# Patient Record
Sex: Male | Born: 1975 | Race: Black or African American | Hispanic: No | Marital: Married
Health system: Southern US, Community
[De-identification: ages and names within clinical notes are randomized; demographics above are authoritative.]

## PROBLEM LIST (undated history)

## (undated) DIAGNOSIS — I82409 Acute embolism and thrombosis of unspecified deep veins of unspecified lower extremity: Secondary | ICD-10-CM

## (undated) DIAGNOSIS — I2699 Other pulmonary embolism without acute cor pulmonale: Secondary | ICD-10-CM

## (undated) DIAGNOSIS — R55 Syncope and collapse: Secondary | ICD-10-CM

## (undated) DIAGNOSIS — Z8249 Family history of ischemic heart disease and other diseases of the circulatory system: Secondary | ICD-10-CM

## (undated) DIAGNOSIS — F1410 Cocaine abuse, uncomplicated: Secondary | ICD-10-CM

## (undated) DIAGNOSIS — Z720 Tobacco use: Secondary | ICD-10-CM

## (undated) HISTORY — PX: NO PAST SURGERIES: SHX2092

---

## 1999-06-14 ENCOUNTER — Emergency Department (HOSPITAL_COMMUNITY): Admission: EM | Admit: 1999-06-14 | Discharge: 1999-06-14 | Payer: Self-pay | Admitting: Emergency Medicine

## 1999-07-22 ENCOUNTER — Emergency Department (HOSPITAL_COMMUNITY): Admission: EM | Admit: 1999-07-22 | Discharge: 1999-07-22 | Payer: Self-pay | Admitting: Emergency Medicine

## 2000-07-24 ENCOUNTER — Emergency Department (HOSPITAL_COMMUNITY): Admission: EM | Admit: 2000-07-24 | Discharge: 2000-07-24 | Payer: Self-pay | Admitting: Emergency Medicine

## 2000-10-05 ENCOUNTER — Emergency Department (HOSPITAL_COMMUNITY): Admission: EM | Admit: 2000-10-05 | Discharge: 2000-10-05 | Payer: Self-pay

## 2002-05-23 ENCOUNTER — Emergency Department (HOSPITAL_COMMUNITY): Admission: EM | Admit: 2002-05-23 | Discharge: 2002-05-23 | Payer: Self-pay | Admitting: Emergency Medicine

## 2003-09-25 ENCOUNTER — Emergency Department (HOSPITAL_COMMUNITY): Admission: EM | Admit: 2003-09-25 | Discharge: 2003-09-25 | Payer: Self-pay | Admitting: Emergency Medicine

## 2004-04-21 ENCOUNTER — Emergency Department (HOSPITAL_COMMUNITY): Admission: EM | Admit: 2004-04-21 | Discharge: 2004-04-21 | Payer: Self-pay | Admitting: Emergency Medicine

## 2007-02-02 ENCOUNTER — Emergency Department (HOSPITAL_COMMUNITY): Admission: EM | Admit: 2007-02-02 | Discharge: 2007-02-02 | Payer: Self-pay | Admitting: Emergency Medicine

## 2007-06-13 ENCOUNTER — Emergency Department (HOSPITAL_COMMUNITY): Admission: EM | Admit: 2007-06-13 | Discharge: 2007-06-13 | Payer: Self-pay | Admitting: Emergency Medicine

## 2008-08-18 ENCOUNTER — Ambulatory Visit: Payer: Self-pay | Admitting: Cardiology

## 2008-08-18 ENCOUNTER — Ambulatory Visit: Payer: Self-pay | Admitting: Family Medicine

## 2008-08-18 ENCOUNTER — Inpatient Hospital Stay (HOSPITAL_COMMUNITY): Admission: EM | Admit: 2008-08-18 | Discharge: 2008-08-21 | Payer: Self-pay | Admitting: Emergency Medicine

## 2008-08-20 ENCOUNTER — Encounter (INDEPENDENT_AMBULATORY_CARE_PROVIDER_SITE_OTHER): Payer: Self-pay | Admitting: Family Medicine

## 2008-08-20 LAB — CONVERTED CEMR LAB: Cholesterol: 142 mg/dL

## 2008-08-24 ENCOUNTER — Telehealth: Payer: Self-pay | Admitting: Family Medicine

## 2008-08-25 ENCOUNTER — Telehealth: Payer: Self-pay | Admitting: *Deleted

## 2008-08-26 ENCOUNTER — Telehealth: Payer: Self-pay | Admitting: *Deleted

## 2008-08-26 ENCOUNTER — Encounter: Payer: Self-pay | Admitting: Family Medicine

## 2008-08-26 ENCOUNTER — Ambulatory Visit: Payer: Self-pay | Admitting: Family Medicine

## 2008-08-26 DIAGNOSIS — I2699 Other pulmonary embolism without acute cor pulmonale: Secondary | ICD-10-CM

## 2008-08-29 ENCOUNTER — Encounter: Payer: Self-pay | Admitting: Family Medicine

## 2008-08-29 ENCOUNTER — Ambulatory Visit: Payer: Self-pay | Admitting: Family Medicine

## 2008-08-29 LAB — CONVERTED CEMR LAB: INR: 1.6

## 2008-09-01 ENCOUNTER — Ambulatory Visit: Payer: Self-pay | Admitting: Family Medicine

## 2008-09-11 ENCOUNTER — Telehealth: Payer: Self-pay | Admitting: Family Medicine

## 2008-10-01 ENCOUNTER — Ambulatory Visit: Payer: Self-pay | Admitting: Family Medicine

## 2008-10-01 ENCOUNTER — Encounter: Payer: Self-pay | Admitting: Family Medicine

## 2008-10-01 ENCOUNTER — Inpatient Hospital Stay (HOSPITAL_COMMUNITY): Admission: AD | Admit: 2008-10-01 | Discharge: 2008-10-05 | Payer: Self-pay | Admitting: Family Medicine

## 2008-10-01 ENCOUNTER — Encounter: Payer: Self-pay | Admitting: Emergency Medicine

## 2008-10-01 DIAGNOSIS — F1410 Cocaine abuse, uncomplicated: Secondary | ICD-10-CM | POA: Insufficient documentation

## 2008-10-01 DIAGNOSIS — R079 Chest pain, unspecified: Secondary | ICD-10-CM

## 2008-10-07 ENCOUNTER — Ambulatory Visit: Payer: Self-pay | Admitting: Family Medicine

## 2008-10-07 LAB — CONVERTED CEMR LAB: INR: 1.9

## 2008-10-09 ENCOUNTER — Telehealth: Payer: Self-pay | Admitting: *Deleted

## 2008-10-14 ENCOUNTER — Telehealth: Payer: Self-pay | Admitting: *Deleted

## 2008-10-22 ENCOUNTER — Inpatient Hospital Stay (HOSPITAL_COMMUNITY): Admission: EM | Admit: 2008-10-22 | Discharge: 2008-10-23 | Payer: Self-pay | Admitting: Emergency Medicine

## 2008-10-22 ENCOUNTER — Ambulatory Visit: Payer: Self-pay | Admitting: Family Medicine

## 2008-10-22 ENCOUNTER — Encounter: Payer: Self-pay | Admitting: Family Medicine

## 2008-10-24 ENCOUNTER — Telehealth: Payer: Self-pay | Admitting: Family Medicine

## 2008-12-27 ENCOUNTER — Encounter (INDEPENDENT_AMBULATORY_CARE_PROVIDER_SITE_OTHER): Payer: Self-pay | Admitting: *Deleted

## 2008-12-27 DIAGNOSIS — F17200 Nicotine dependence, unspecified, uncomplicated: Secondary | ICD-10-CM

## 2009-03-19 ENCOUNTER — Emergency Department (HOSPITAL_COMMUNITY): Admission: EM | Admit: 2009-03-19 | Discharge: 2009-03-19 | Payer: Self-pay | Admitting: Emergency Medicine

## 2009-05-18 ENCOUNTER — Emergency Department (HOSPITAL_COMMUNITY): Admission: EM | Admit: 2009-05-18 | Discharge: 2009-05-18 | Payer: Self-pay | Admitting: Internal Medicine

## 2009-05-19 ENCOUNTER — Emergency Department (HOSPITAL_COMMUNITY): Admission: EM | Admit: 2009-05-19 | Discharge: 2009-05-19 | Payer: Self-pay | Admitting: Emergency Medicine

## 2009-05-24 ENCOUNTER — Emergency Department (HOSPITAL_COMMUNITY): Admission: EM | Admit: 2009-05-24 | Discharge: 2009-05-25 | Payer: Self-pay | Admitting: Emergency Medicine

## 2009-06-01 ENCOUNTER — Emergency Department (HOSPITAL_COMMUNITY): Admission: EM | Admit: 2009-06-01 | Discharge: 2009-06-01 | Payer: Self-pay | Admitting: Emergency Medicine

## 2009-08-10 ENCOUNTER — Emergency Department (HOSPITAL_COMMUNITY): Admission: EM | Admit: 2009-08-10 | Discharge: 2009-08-10 | Payer: Self-pay | Admitting: Emergency Medicine

## 2009-08-29 IMAGING — CT CT ANGIO CHEST
1 of 3 series · 18 of 33 positions shown · IV contrast (APPLIED)
Comparison: 08/18/2008

CLINICAL DATA: Chest pain and shortness of breath.  Question
pulmonary embolus.  Cocaine abuse.

CT ANGIOGRAPHY CHEST WITH CONTRAST
TECHNIQUE: Multidetector CT imaging of the chest was performed
using the standard protocol during bolus administration of
intravenous contrast. Multiplanar CT image reconstructions
including MIPs were obtained to evaluate the vascular anatomy.
Contrast: 100 ml [id number]

[Series 5: pe thins @ 1mm · axial · 0.70mm/px · z∈[-332,-57]mm · 18 of 315 slices shown]
[im 20/315  lung]
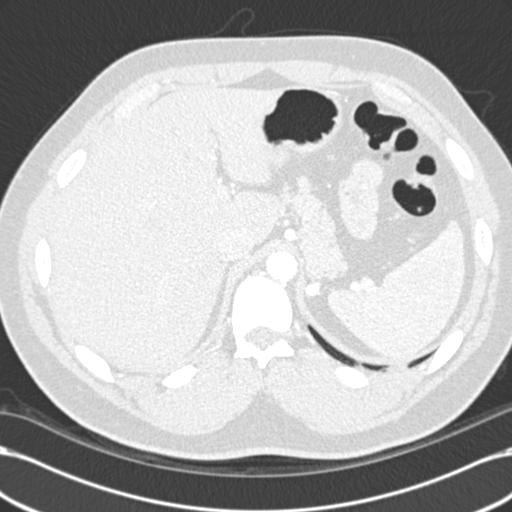
[im 40/315  mediastinal]
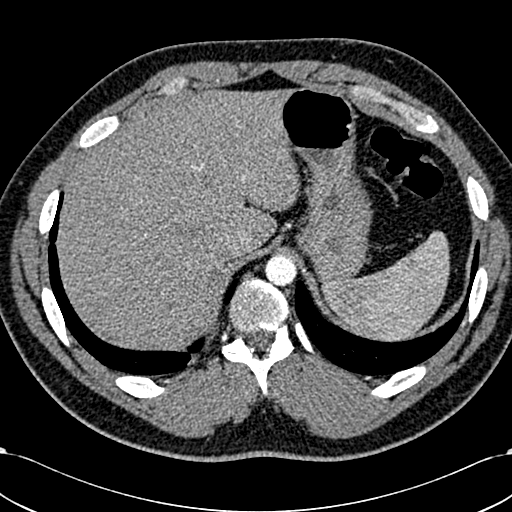
[im 59/315  lung]
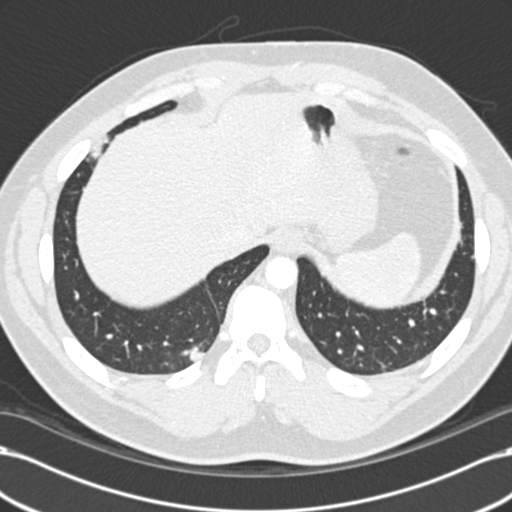
[im 79/315  mediastinal]
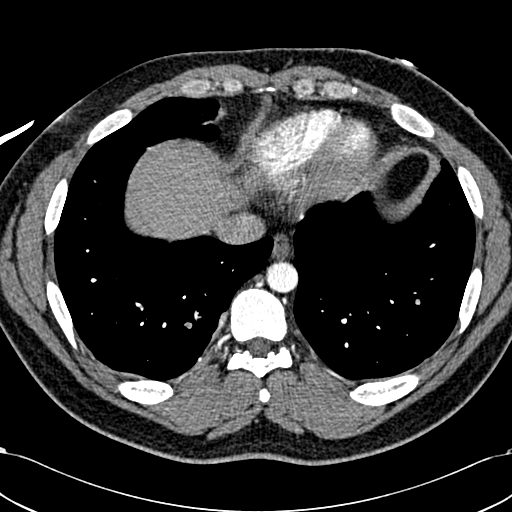
[im 99/315  lung]
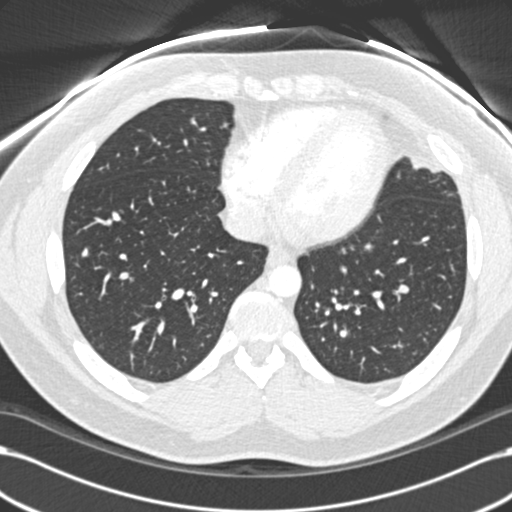
[im 105/315  mediastinal]
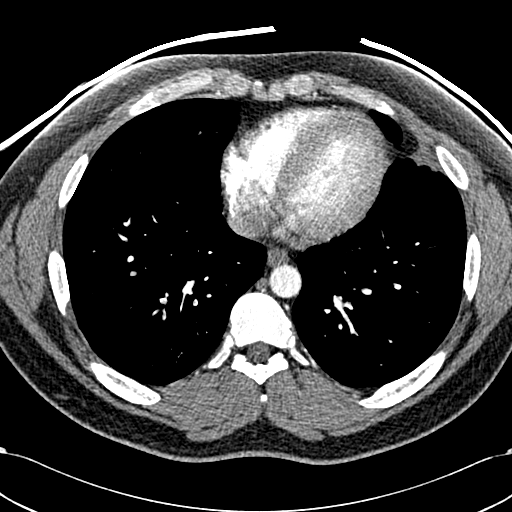
[im 118/315  lung]
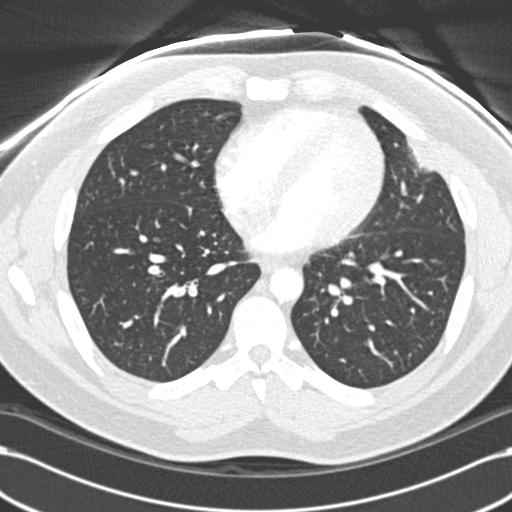
[im 138/315  mediastinal]
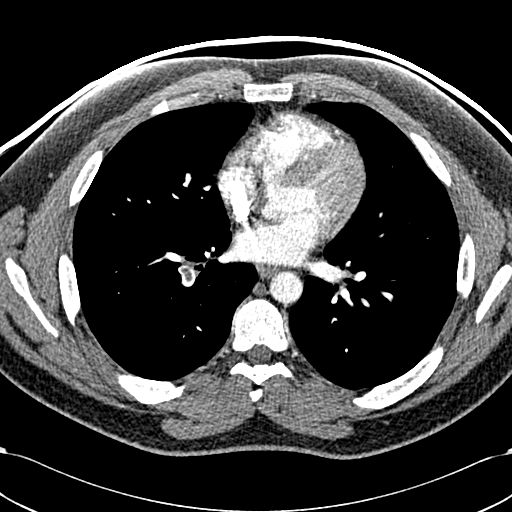
[im 148/315  lung]
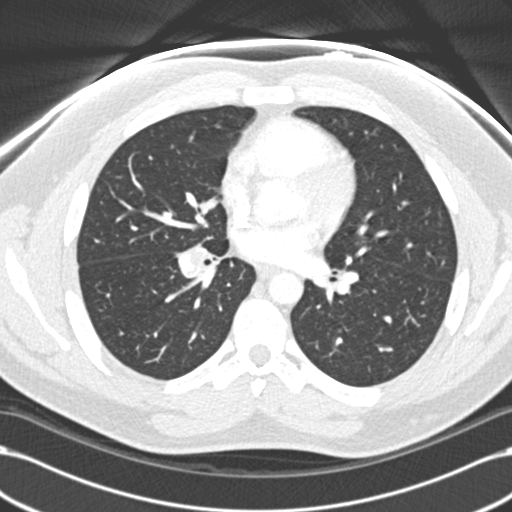
[im 158/315  mediastinal]
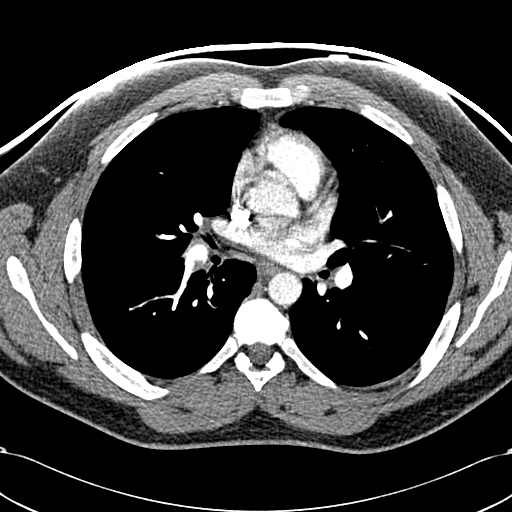
[im 177/315  lung]
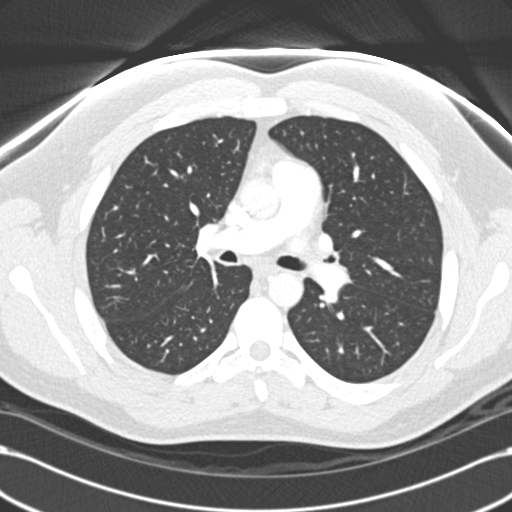
[im 197/315  mediastinal]
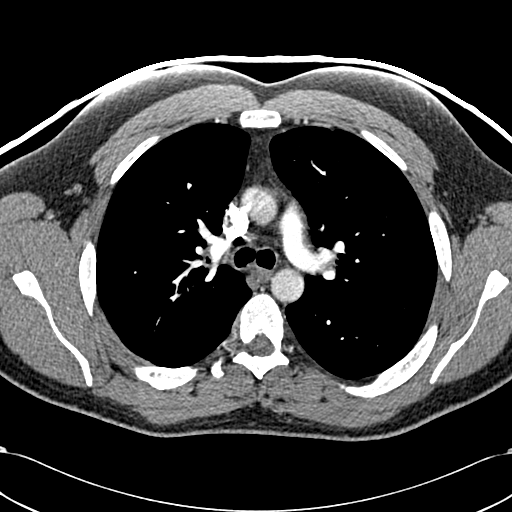
[im 210/315  lung]
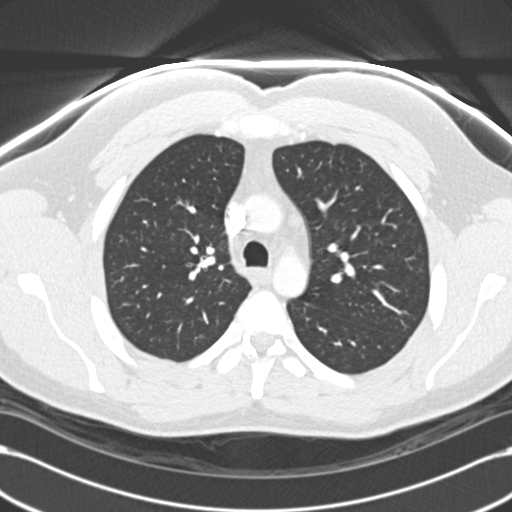
[im 216/315  mediastinal]
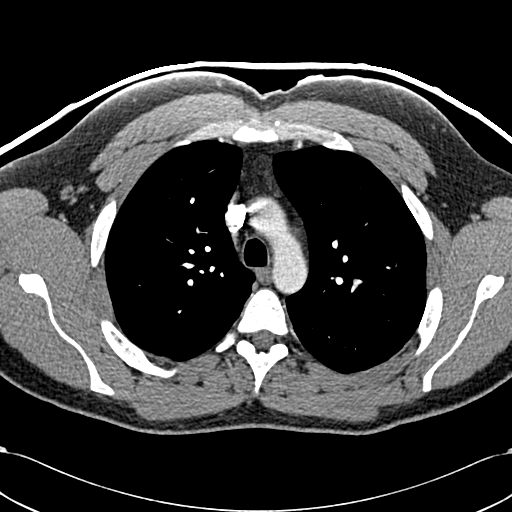
[im 236/315  lung]
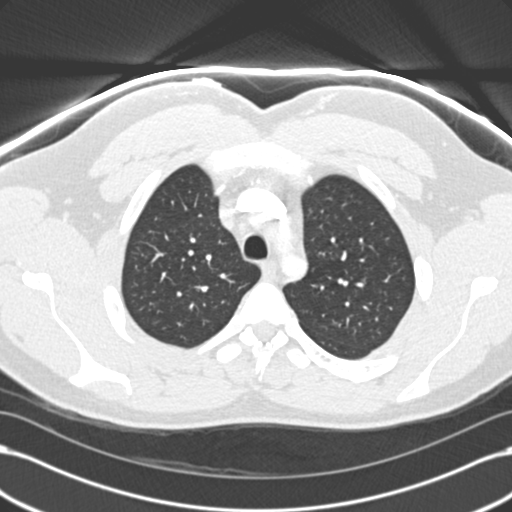
[im 256/315  mediastinal]
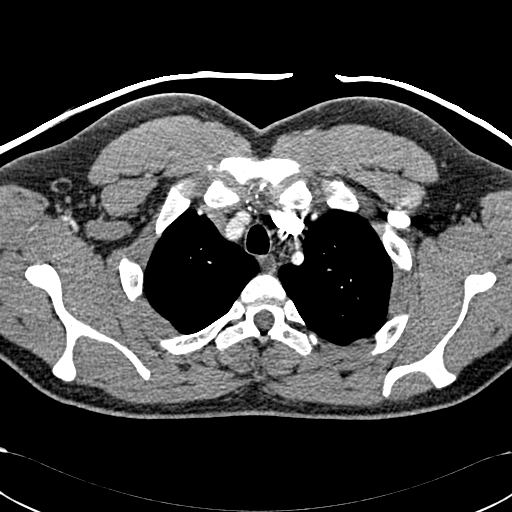
[im 275/315  lung]
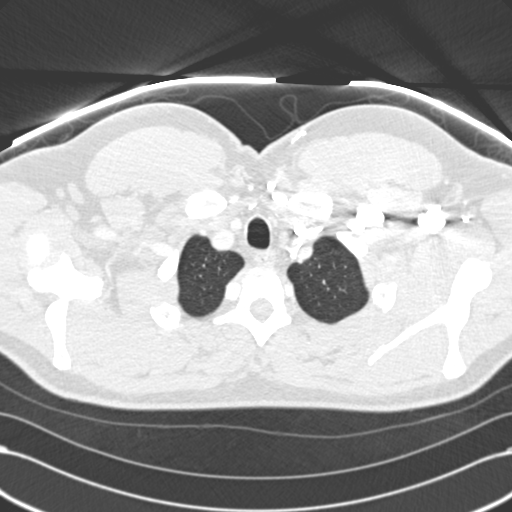
[im 295/315  mediastinal]
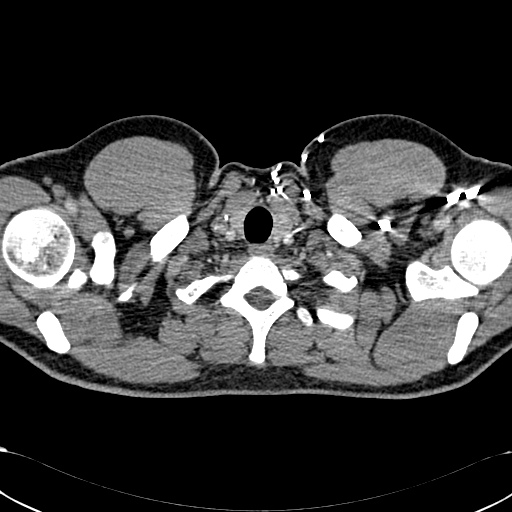

[18 of 33 positions shown; findings below may reference images not displayed]

FINDINGS: New filling defects are seen in right lower lobe
pulmonary arteries, with the most proximal clot seen at the lobar
level.  A subsegmental filling defect is seen in the left lower
lobe.  Mediastinal lymph nodes are not enlarged by CT size
criteria.  No hilar or axillary adenopathy.  There may be slight
straightening of the interventricular septum.  Heart size normal.
No pericardial effusion.

There is probable subpleural scarring in the lingula and both lower
lobes.  Lungs are otherwise clear.  No pleural fluid.  Airway is
unremarkable.

Incidental imaging of the upper abdomen shows no acute findings.
No worrisome lytic or sclerotic lesions.

Review of the MIP images confirms the above findings.
IMPRESSION: Bilateral pulmonary emboli.  Cannot exclude early or mild right
heart strain.

## 2009-10-27 ENCOUNTER — Emergency Department (HOSPITAL_COMMUNITY): Admission: EM | Admit: 2009-10-27 | Discharge: 2009-10-27 | Payer: Self-pay | Admitting: Emergency Medicine

## 2009-11-23 ENCOUNTER — Emergency Department (HOSPITAL_COMMUNITY): Admission: EM | Admit: 2009-11-23 | Discharge: 2009-11-23 | Payer: Self-pay | Admitting: Emergency Medicine

## 2009-12-28 ENCOUNTER — Emergency Department (HOSPITAL_COMMUNITY): Admission: EM | Admit: 2009-12-28 | Discharge: 2009-12-28 | Payer: Self-pay | Admitting: Emergency Medicine

## 2010-06-02 LAB — POCT CARDIAC MARKERS
CKMB, poc: 1.1 ng/mL (ref 1.0–8.0)
Myoglobin, poc: 65.1 ng/mL (ref 12–200)
Troponin i, poc: <0.05 ng/mL (ref 0.00–0.09)
Troponin i, poc: <0.05 ng/mL (ref 0.00–0.09)

## 2010-06-02 LAB — POCT I-STAT, CHEM 8
BUN: 12 mg/dL (ref 6–23)
Calcium, Ion: 1.1 mmol/L — ABNORMAL LOW (ref 1.12–1.32)
Chloride: 106 meq/L (ref 96–112)
Creatinine, Ser: 1.4 mg/dL (ref 0.4–1.5)
Glucose, Bld: 105 mg/dL — ABNORMAL HIGH (ref 70–99)
HCT: 47 % (ref 39.0–52.0)
Hemoglobin: 16 g/dL (ref 13.0–17.0)
Potassium: 3.6 meq/L (ref 3.5–5.1)
Sodium: 140 meq/L (ref 135–145)
TCO2: 22 mmol/L (ref 0–100)

## 2010-06-02 LAB — DIFFERENTIAL
Basophils Absolute: 0 K/uL (ref 0.0–0.1)
Basophils Relative: 0 % (ref 0–1)
Eosinophils Absolute: 0 K/uL (ref 0.0–0.7)
Eosinophils Relative: 0 % (ref 0–5)
Lymphocytes Relative: 7 % — ABNORMAL LOW (ref 12–46)
Lymphs Abs: 1 K/uL (ref 0.7–4.0)
Monocytes Absolute: 1 K/uL (ref 0.1–1.0)
Monocytes Relative: 7 % (ref 3–12)
Neutro Abs: 12.4 K/uL — ABNORMAL HIGH (ref 1.7–7.7)
Neutrophils Relative %: 86 % — ABNORMAL HIGH (ref 43–77)

## 2010-06-02 LAB — CBC
Platelets: 139 10*3/uL — ABNORMAL LOW (ref 150–400)
RDW: 13.9 % (ref 11.5–15.5)
WBC: 14.4 10*3/uL — ABNORMAL HIGH (ref 4.0–10.5)

## 2010-06-02 LAB — D-DIMER, QUANTITATIVE

## 2010-06-03 LAB — D-DIMER, QUANTITATIVE: D-Dimer, Quant: 0.27 ug/mL-FEU (ref 0.00–0.48)

## 2010-06-03 LAB — POCT CARDIAC MARKERS
CKMB, poc: <1 ng/mL — ABNORMAL LOW (ref 1.0–8.0)
CKMB, poc: <1 ng/mL — ABNORMAL LOW (ref 1.0–8.0)
Myoglobin, poc: 72 ng/mL (ref 12–200)
Troponin i, poc: <0.05 ng/mL (ref 0.00–0.09)

## 2010-06-03 LAB — CBC
HCT: 45.1 % (ref 39.0–52.0)
MCH: 30 pg (ref 26.0–34.0)
MCHC: 34.2 g/dL (ref 30.0–36.0)
MCV: 87.8 fL (ref 78.0–100.0)
Platelets: 153 10*3/uL (ref 150–400)
RDW: 14.2 % (ref 11.5–15.5)
WBC: 13 10*3/uL — ABNORMAL HIGH (ref 4.0–10.5)

## 2010-06-03 LAB — BASIC METABOLIC PANEL
BUN: 11 mg/dL (ref 6–23)
Chloride: 104 mEq/L (ref 96–112)
Creatinine, Ser: 1.33 mg/dL (ref 0.4–1.5)
Glucose, Bld: 110 mg/dL — ABNORMAL HIGH (ref 70–99)
Potassium: 3.6 mEq/L (ref 3.5–5.1)

## 2010-06-03 LAB — DIFFERENTIAL
Basophils Absolute: 0 10*3/uL (ref 0.0–0.1)
Basophils Relative: 0 % (ref 0–1)
Eosinophils Absolute: 0.1 10*3/uL (ref 0.0–0.7)
Eosinophils Relative: 1 % (ref 0–5)
Lymphocytes Relative: 12 % (ref 12–46)
Monocytes Absolute: 0.9 10*3/uL (ref 0.1–1.0)

## 2010-06-04 LAB — COMPREHENSIVE METABOLIC PANEL
AST: 38 U/L — ABNORMAL HIGH (ref 0–37)
Albumin: 4 g/dL (ref 3.5–5.2)
Alkaline Phosphatase: 57 U/L (ref 39–117)
BUN: 9 mg/dL (ref 6–23)
Creatinine, Ser: 1.46 mg/dL (ref 0.4–1.5)
GFR calc Af Amer: >60 mL/min (ref >60)
Potassium: 3.8 mEq/L (ref 3.5–5.1)
Total Protein: 7 g/dL (ref 6.0–8.3)

## 2010-06-04 LAB — POCT CARDIAC MARKERS
CKMB, poc: <1 ng/mL — ABNORMAL LOW (ref 1.0–8.0)
Troponin i, poc: <0.05 ng/mL (ref 0.00–0.09)
Troponin i, poc: <0.05 ng/mL (ref 0.00–0.09)

## 2010-06-04 LAB — RAPID URINE DRUG SCREEN, HOSP PERFORMED
Amphetamines: NOT DETECTED
Benzodiazepines: NOT DETECTED
Cocaine: POSITIVE — AB
Opiates: NOT DETECTED
Tetrahydrocannabinol: NOT DETECTED

## 2010-06-04 LAB — DIFFERENTIAL
Eosinophils Relative: 2 % (ref 0–5)
Lymphocytes Relative: 17 % (ref 12–46)
Monocytes Absolute: 1.1 10*3/uL — ABNORMAL HIGH (ref 0.1–1.0)
Monocytes Relative: 12 % (ref 3–12)
Neutro Abs: 6.9 10*3/uL (ref 1.7–7.7)

## 2010-06-04 LAB — CBC
MCV: 87 fL (ref 78.0–100.0)
Platelets: 138 10*3/uL — ABNORMAL LOW (ref 150–400)
RBC: 5.46 MIL/uL (ref 4.22–5.81)
RDW: 14.5 % (ref 11.5–15.5)
WBC: 9.8 10*3/uL (ref 4.0–10.5)

## 2010-06-04 LAB — URINALYSIS, ROUTINE W REFLEX MICROSCOPIC
Bilirubin Urine: NEGATIVE
Hgb urine dipstick: NEGATIVE
Specific Gravity, Urine: 1.017 (ref 1.005–1.030)
pH: 5.5 (ref 5.0–8.0)

## 2010-06-07 LAB — CBC
Hemoglobin: 15 g/dL (ref 13.0–17.0)
RBC: 5.04 MIL/uL (ref 4.22–5.81)
RDW: 14.3 % (ref 11.5–15.5)
WBC: 10.3 10*3/uL (ref 4.0–10.5)

## 2010-06-07 LAB — DIFFERENTIAL
Basophils Relative: 0 % (ref 0–1)
Lymphocytes Relative: 14 % (ref 12–46)
Monocytes Absolute: 0.7 10*3/uL (ref 0.1–1.0)
Monocytes Relative: 7 % (ref 3–12)
Neutro Abs: 8.1 10*3/uL — ABNORMAL HIGH (ref 1.7–7.7)

## 2010-06-07 LAB — CK TOTAL AND CKMB (NOT AT ARMC)
CK, MB: 2.4 ng/mL (ref 0.3–4.0)
Relative Index: 0.8 (ref 0.0–2.5)

## 2010-06-07 LAB — COMPREHENSIVE METABOLIC PANEL
Albumin: 3.9 g/dL (ref 3.5–5.2)
Alkaline Phosphatase: 39 U/L (ref 39–117)
BUN: 9 mg/dL (ref 6–23)
Potassium: 3.5 mEq/L (ref 3.5–5.1)
Total Protein: 6.5 g/dL (ref 6.0–8.3)

## 2010-06-09 LAB — CBC
HCT: 44 % (ref 39.0–52.0)
Hemoglobin: 15.1 g/dL (ref 13.0–17.0)
MCHC: 34.4 g/dL (ref 30.0–36.0)
MCV: 89.5 fL (ref 78.0–100.0)
RBC: 4.92 MIL/uL (ref 4.22–5.81)

## 2010-06-09 LAB — DIFFERENTIAL
Lymphocytes Relative: 19 % (ref 12–46)
Monocytes Absolute: 0.8 10*3/uL (ref 0.1–1.0)
Monocytes Relative: 9 % (ref 3–12)
Neutro Abs: 6.8 10*3/uL (ref 1.7–7.7)

## 2010-06-09 LAB — TROPONIN I: Troponin I: 0.01 ng/mL (ref 0.00–0.06)

## 2010-06-09 LAB — BASIC METABOLIC PANEL
CO2: 27 mEq/L (ref 19–32)
Chloride: 106 mEq/L (ref 96–112)
GFR calc Af Amer: >60 mL/min (ref >60)
Sodium: 143 mEq/L (ref 135–145)

## 2010-06-09 LAB — CK TOTAL AND CKMB (NOT AT ARMC)
CK, MB: 2.6 ng/mL (ref 0.3–4.0)
Total CK: 286 U/L — ABNORMAL HIGH (ref 7–232)

## 2010-06-09 LAB — PROTIME-INR: INR: 1.02 (ref 0.00–1.49)

## 2010-06-13 LAB — POCT I-STAT, CHEM 8
BUN: 7 mg/dL (ref 6–23)
Chloride: 105 mEq/L (ref 96–112)
Creatinine, Ser: 1.3 mg/dL (ref 0.4–1.5)
Creatinine, Ser: 1.5 mg/dL (ref 0.4–1.5)
HCT: 47 % (ref 39.0–52.0)
Hemoglobin: 16 g/dL (ref 13.0–17.0)
Potassium: 3.9 mEq/L (ref 3.5–5.1)
Sodium: 140 mEq/L (ref 135–145)
Sodium: 140 mEq/L (ref 135–145)
TCO2: 27 mmol/L (ref 0–100)

## 2010-06-13 LAB — D-DIMER, QUANTITATIVE
D-Dimer, Quant: 0.29 ug/mL-FEU (ref 0.00–0.48)
D-Dimer, Quant: <0.22 ug/mL-FEU (ref 0.00–0.48)

## 2010-06-13 LAB — POCT CARDIAC MARKERS
CKMB, poc: <1 ng/mL — ABNORMAL LOW (ref 1.0–8.0)
Myoglobin, poc: 76.6 ng/mL (ref 12–200)
Myoglobin, poc: 92.4 ng/mL (ref 12–200)
Troponin i, poc: <0.05 ng/mL (ref 0.00–0.09)

## 2010-06-13 LAB — DIFFERENTIAL
Basophils Absolute: 0 10*3/uL (ref 0.0–0.1)
Eosinophils Absolute: 0 10*3/uL (ref 0.0–0.7)
Eosinophils Relative: 0 % (ref 0–5)
Neutrophils Relative %: 81 % — ABNORMAL HIGH (ref 43–77)

## 2010-06-13 LAB — CBC
HCT: 48 % (ref 39.0–52.0)
MCV: 90.5 fL (ref 78.0–100.0)
Platelets: 158 10*3/uL (ref 150–400)
RDW: 13.6 % (ref 11.5–15.5)

## 2010-06-21 LAB — RAPID URINE DRUG SCREEN, HOSP PERFORMED
Amphetamines: NOT DETECTED
Opiates: NOT DETECTED
Tetrahydrocannabinol: NOT DETECTED

## 2010-06-21 LAB — CBC
HCT: 47.6 % (ref 39.0–52.0)
Platelets: 137 10*3/uL — ABNORMAL LOW (ref 150–400)
WBC: 11 10*3/uL — ABNORMAL HIGH (ref 4.0–10.5)

## 2010-06-21 LAB — PROTIME-INR: INR: 1 (ref 0.00–1.49)

## 2010-06-21 LAB — BASIC METABOLIC PANEL
BUN: 11 mg/dL (ref 6–23)
Creatinine, Ser: 1.32 mg/dL (ref 0.4–1.5)
GFR calc non Af Amer: >60 mL/min (ref >60)
Potassium: 3.5 mEq/L (ref 3.5–5.1)

## 2010-06-21 LAB — POCT CARDIAC MARKERS
CKMB, poc: 1.1 ng/mL (ref 1.0–8.0)
Myoglobin, poc: 92.3 ng/mL (ref 12–200)
Troponin i, poc: <0.05 ng/mL (ref 0.00–0.09)
Troponin i, poc: <0.05 ng/mL (ref 0.00–0.09)

## 2010-06-21 LAB — D-DIMER, QUANTITATIVE: D-Dimer, Quant: <0.22 ug/mL-FEU (ref 0.00–0.48)

## 2010-06-26 LAB — CARDIAC PANEL(CRET KIN+CKTOT+MB+TROPI)
CK, MB: 1.3 ng/mL (ref 0.3–4.0)
Relative Index: 0.6 (ref 0.0–2.5)
Relative Index: 1.1 (ref 0.0–2.5)
Total CK: 180 U/L (ref 7–232)
Total CK: 203 U/L (ref 7–232)
Troponin I: 0.01 ng/mL (ref 0.00–0.06)

## 2010-06-26 LAB — GLUCOSE, CAPILLARY: Glucose-Capillary: 106 mg/dL — ABNORMAL HIGH (ref 70–99)

## 2010-06-26 LAB — COMPREHENSIVE METABOLIC PANEL
ALT: 125 U/L — ABNORMAL HIGH (ref 0–53)
AST: 46 U/L — ABNORMAL HIGH (ref 0–37)
Alkaline Phosphatase: 65 U/L (ref 39–117)
CO2: 29 mEq/L (ref 19–32)
Calcium: 8.7 mg/dL (ref 8.4–10.5)
Chloride: 105 mEq/L (ref 96–112)
GFR calc Af Amer: >60 mL/min (ref >60)
GFR calc non Af Amer: 56 mL/min — ABNORMAL LOW (ref >60)
Potassium: 4 mEq/L (ref 3.5–5.1)
Sodium: 138 mEq/L (ref 135–145)

## 2010-06-26 LAB — PROTIME-INR
INR: 3.4 — ABNORMAL HIGH (ref 0.00–1.49)
Prothrombin Time: 33.9 seconds — ABNORMAL HIGH (ref 11.6–15.2)

## 2010-06-26 LAB — ETHANOL: Alcohol, Ethyl (B): <5 mg/dL (ref 0–10)

## 2010-06-26 LAB — POCT CARDIAC MARKERS
CKMB, poc: 1.4 ng/mL (ref 1.0–8.0)
Myoglobin, poc: 129 ng/mL (ref 12–200)
Troponin i, poc: <0.05 ng/mL (ref 0.00–0.09)

## 2010-06-26 LAB — CBC
Hemoglobin: 15.4 g/dL (ref 13.0–17.0)
MCHC: 34.6 g/dL (ref 30.0–36.0)
RBC: 5.1 MIL/uL (ref 4.22–5.81)
WBC: 6.8 10*3/uL (ref 4.0–10.5)

## 2010-06-26 LAB — POCT I-STAT, CHEM 8
Chloride: 107 mEq/L (ref 96–112)
HCT: 49 % (ref 39.0–52.0)
Hemoglobin: 16.7 g/dL (ref 13.0–17.0)
Potassium: 3.5 mEq/L (ref 3.5–5.1)
Sodium: 139 mEq/L (ref 135–145)

## 2010-06-26 LAB — HEPATIC FUNCTION PANEL
ALT: 132 U/L — ABNORMAL HIGH (ref 0–53)
Bilirubin, Direct: 0.3 mg/dL (ref 0.0–0.3)
Indirect Bilirubin: 0.5 mg/dL (ref 0.3–0.9)
Total Protein: 6.8 g/dL (ref 6.0–8.3)

## 2010-06-27 LAB — PROTIME-INR
INR: 1.5 (ref 0.00–1.49)
INR: 1.8 — ABNORMAL HIGH (ref 0.00–1.49)
INR: 1.1 (ref 0.00–1.49)
Prothrombin Time: 19.2 seconds — ABNORMAL HIGH (ref 11.6–15.2)
Prothrombin Time: 14.2 seconds (ref 11.6–15.2)

## 2010-06-27 LAB — CBC
HCT: 44.9 % (ref 39.0–52.0)
MCV: 90.3 fL (ref 78.0–100.0)
Platelets: 165 10*3/uL (ref 150–400)
RBC: 4.84 MIL/uL (ref 4.22–5.81)
RDW: 13.9 % (ref 11.5–15.5)
WBC: 7 10*3/uL (ref 4.0–10.5)

## 2010-06-27 LAB — BASIC METABOLIC PANEL
CO2: 27 mEq/L (ref 19–32)
Chloride: 107 mEq/L (ref 96–112)
GFR calc Af Amer: >60 mL/min (ref >60)
Sodium: 141 mEq/L (ref 135–145)

## 2010-06-27 LAB — CARDIAC PANEL(CRET KIN+CKTOT+MB+TROPI)
CK, MB: 1 ng/mL (ref 0.3–4.0)
CK, MB: 1.3 ng/mL (ref 0.3–4.0)
Relative Index: 1 (ref 0.0–2.5)
Total CK: 121 U/L (ref 7–232)
Total CK: 124 U/L (ref 7–232)

## 2010-06-28 LAB — RAPID URINE DRUG SCREEN, HOSP PERFORMED
Amphetamines: NOT DETECTED
Opiates: NOT DETECTED
Tetrahydrocannabinol: NOT DETECTED

## 2010-06-28 LAB — CBC
HCT: 42.2 % (ref 39.0–52.0)
Hemoglobin: 13.7 g/dL (ref 13.0–17.0)
MCHC: 33.6 g/dL (ref 30.0–36.0)
MCHC: 33.6 g/dL (ref 30.0–36.0)
MCHC: 34.1 g/dL (ref 30.0–36.0)
MCV: 89.4 fL (ref 78.0–100.0)
MCV: 90.1 fL (ref 78.0–100.0)
Platelets: 107 10*3/uL — ABNORMAL LOW (ref 150–400)
Platelets: 125 10*3/uL — ABNORMAL LOW (ref 150–400)
Platelets: 149 10*3/uL — ABNORMAL LOW (ref 150–400)
RBC: 4.43 MIL/uL (ref 4.22–5.81)
RBC: 4.69 MIL/uL (ref 4.22–5.81)
RBC: 5.06 MIL/uL (ref 4.22–5.81)
RDW: 13.7 % (ref 11.5–15.5)
RDW: 13.7 % (ref 11.5–15.5)
RDW: 14 % (ref 11.5–15.5)
WBC: 12.4 10*3/uL — ABNORMAL HIGH (ref 4.0–10.5)

## 2010-06-28 LAB — PROTIME-INR
INR: 1 (ref 0.00–1.49)
INR: 1.7 — ABNORMAL HIGH (ref 0.00–1.49)
Prothrombin Time: 14.2 seconds (ref 11.6–15.2)
Prothrombin Time: 20.6 seconds — ABNORMAL HIGH (ref 11.6–15.2)

## 2010-06-28 LAB — URINALYSIS, ROUTINE W REFLEX MICROSCOPIC
Bilirubin Urine: NEGATIVE
Glucose, UA: NEGATIVE mg/dL
Hgb urine dipstick: NEGATIVE
Hgb urine dipstick: NEGATIVE
Protein, ur: NEGATIVE mg/dL
Specific Gravity, Urine: 1.001 — ABNORMAL LOW (ref 1.005–1.030)
Specific Gravity, Urine: 1.025 (ref 1.005–1.030)
Urobilinogen, UA: 0.2 mg/dL (ref 0.0–1.0)

## 2010-06-28 LAB — BASIC METABOLIC PANEL
BUN: 5 mg/dL — ABNORMAL LOW (ref 6–23)
CO2: 29 mEq/L (ref 19–32)
CO2: 30 mEq/L (ref 19–32)
Calcium: 8.9 mg/dL (ref 8.4–10.5)
Calcium: 8.9 mg/dL (ref 8.4–10.5)
Chloride: 102 mEq/L (ref 96–112)
Chloride: 103 mEq/L (ref 96–112)
Creatinine, Ser: 1.4 mg/dL (ref 0.4–1.5)
Creatinine, Ser: 1.42 mg/dL (ref 0.4–1.5)
Creatinine, Ser: 1.42 mg/dL (ref 0.4–1.5)
GFR calc Af Amer: >60 mL/min (ref >60)
GFR calc Af Amer: >60 mL/min (ref >60)
GFR calc Af Amer: >60 mL/min (ref >60)
GFR calc non Af Amer: 58 mL/min — ABNORMAL LOW (ref >60)
GFR calc non Af Amer: 58 mL/min — ABNORMAL LOW (ref >60)
Glucose, Bld: 99 mg/dL (ref 70–99)
Potassium: 4.1 mEq/L (ref 3.5–5.1)
Sodium: 140 mEq/L (ref 135–145)

## 2010-06-28 LAB — DIFFERENTIAL
Basophils Absolute: 0.1 10*3/uL (ref 0.0–0.1)
Basophils Relative: 1 % (ref 0–1)
Monocytes Relative: 5 % (ref 3–12)
Neutro Abs: 9.9 10*3/uL — ABNORMAL HIGH (ref 1.7–7.7)
Neutrophils Relative %: 84 % — ABNORMAL HIGH (ref 43–77)

## 2010-06-28 LAB — CARDIAC PANEL(CRET KIN+CKTOT+MB+TROPI)
CK, MB: 0.9 ng/mL (ref 0.3–4.0)
Relative Index: 0.8 (ref 0.0–2.5)
Total CK: 106 U/L (ref 7–232)
Total CK: 136 U/L (ref 7–232)
Troponin I: 0.01 ng/mL (ref 0.00–0.06)
Troponin I: 0.02 ng/mL (ref 0.00–0.06)

## 2010-06-28 LAB — URINE CULTURE
Culture: NO GROWTH
Special Requests: NEGATIVE

## 2010-06-28 LAB — POCT CARDIAC MARKERS
CKMB, poc: <1 ng/mL — ABNORMAL LOW (ref 1.0–8.0)
Myoglobin, poc: 70.7 ng/mL (ref 12–200)
Troponin i, poc: <0.05 ng/mL (ref 0.00–0.09)

## 2010-06-28 LAB — LIPID PANEL
LDL Cholesterol: 94 mg/dL (ref 0–99)
Total CHOL/HDL Ratio: 4.7 RATIO
VLDL: 18 mg/dL (ref 0–40)

## 2010-06-28 LAB — POCT I-STAT, CHEM 8
Chloride: 99 mEq/L (ref 96–112)
HCT: 48 % (ref 39.0–52.0)
Potassium: 3.2 mEq/L — ABNORMAL LOW (ref 3.5–5.1)
Sodium: 135 mEq/L (ref 135–145)

## 2010-06-29 LAB — RAPID URINE DRUG SCREEN, HOSP PERFORMED
Amphetamines: NOT DETECTED
Barbiturates: NOT DETECTED

## 2010-06-29 LAB — CULTURE, BLOOD (ROUTINE X 2)

## 2010-06-29 LAB — POCT I-STAT, CHEM 8
BUN: 13 mg/dL (ref 6–23)
Chloride: 105 mEq/L (ref 96–112)
Creatinine, Ser: 1.8 mg/dL — ABNORMAL HIGH (ref 0.4–1.5)
Glucose, Bld: 91 mg/dL (ref 70–99)
Potassium: 4 mEq/L (ref 3.5–5.1)
Sodium: 138 mEq/L (ref 135–145)

## 2010-06-29 LAB — POCT CARDIAC MARKERS
CKMB, poc: <1 ng/mL — ABNORMAL LOW (ref 1.0–8.0)
Myoglobin, poc: 127 ng/mL (ref 12–200)
Troponin i, poc: <0.05 ng/mL (ref 0.00–0.09)

## 2010-06-29 LAB — DIFFERENTIAL
Lymphocytes Relative: 10 % — ABNORMAL LOW (ref 12–46)
Lymphs Abs: 1.4 10*3/uL (ref 0.7–4.0)
Monocytes Relative: 9 % (ref 3–12)
Neutrophils Relative %: 80 % — ABNORMAL HIGH (ref 43–77)

## 2010-06-29 LAB — CK TOTAL AND CKMB (NOT AT ARMC)
CK, MB: 1.4 ng/mL (ref 0.3–4.0)
Relative Index: 0.8 (ref 0.0–2.5)

## 2010-06-29 LAB — CBC
Platelets: 115 10*3/uL — ABNORMAL LOW (ref 150–400)
RBC: 5.37 MIL/uL (ref 4.22–5.81)
WBC: 13.2 10*3/uL — ABNORMAL HIGH (ref 4.0–10.5)

## 2010-06-29 LAB — PROTIME-INR: Prothrombin Time: 13.8 seconds (ref 11.6–15.2)

## 2010-06-29 LAB — TROPONIN I: Troponin I: 0.02 ng/mL (ref 0.00–0.06)

## 2010-08-03 NOTE — Discharge Summary (Signed)
[name], [name]                  ACCOUNT NO.:  [id number]   MEDICAL RECORD NO.:  [id number]          PATIENT TYPE:  INP   LOCATION:  3703                         FACILITY:  [facility]   PHYSICIAN:  Santiago Bumpers. Hensel, M.D.DATE OF BIRTH:  06-29-1975   DATE OF ADMISSION:  08/18/2008  DATE OF DISCHARGE:  08/21/2008                               DISCHARGE SUMMARY   REASON FOR HOSPITALIZATION:  Shortness of breath with left lung  pulmonary emboli diagnosed by CT scan.   DISCHARGE DIAGNOSES:  1. Pulmonary emboli, left lower lobe in the lingula.  2. Acute renal insufficiency resolved with hydration.  3. Tobacco abuse.  4. Cocaine abuse.  5. Cannabis abuse.   DISCHARGE MEDICATIONS:  1. Coumadin 5 mg tablet per instructions.  2. Lovenox 100 mg injected subcutaneously q.12 hours for pulmonary      embolism treatment.  3. Ibuprofen 600 mg tablet one by mouth 4 times daily as needed for      pain.  4. Ultram 50 mg p.o. every 6 hours as needed for pain.   PROCEDURES:  1. CT angiogram of the chest performed on Aug 18, 2008 showed acute      lingular and left lower lobe pulmonary emboli without CT evidence      for right heart strain.  2. Followup chest x-ray showed left lower lobe opacity consistent with      diagnosis of pulmonary embolism.   PERTINENT LABS:  1. D-dimer on admission was elevated to 0.14.  2. Urine drug screen was positive for cocaine and THC.  3. Cardiac enzymes were negative x3 sets.  4. Lipid panel showed cholesterol 142 with an LDL of 94 and HDL of 30.  5. Urine culture was negative.  6. Blood cultures were negative x2.   DISCHARGE LAB WORK:  Basic metabolic panel on day of discharge showed  sodium 140, potassium 3.9, creatinine 1.42, INR on date of discharge was  1.7.   BRIEF HOSPITAL COURSE:  The patient is a 35 year old African American  male with a history of cannabis abuse and cocaine abuse who presented to  the emergency department with shortness of breath  and chest pain.  CT  angiogram of the chest showed acute lingular and left lower lobe  pulmonary emboli.  Accordingly the patient was started on treatment dose  Lovenox with a bridge to oral Coumadin.  The patient did exhibit chest  pain while he was in the hospital.  Cardiac enzymes were negative x3  sets.  An EKG was within normal limits.  His pain was treated with  ibuprofen and Ultram with adequate control.  The patient did not have  any oxygen desaturations in the hospital.  At the time of discharge the  patient had received 10 mg of Coumadin on day one, 10 mg Coumadin on day  2, and 5 mg Coumadin on day of discharge with an INR of 1.7.  Home  health was being set up at the time of this dictation to continue home  Lovenox dosing of q.12 hours with continued bridge on Coumadin.   DISCHARGE  CONDITION:  Stable.   DISPOSITION:  Patient discharged to home with home health for Lovenox  dosing and INR checks daily through August 25, 2008.   FOLLOW UP:  The patient is to follow up as a new patient at [facility] with myself, Dr. Myrtie Soman on August 26, 2008 at 1:15  p.m.   ISSUES FOR FOLLOWUP:  Home health has been arranged for the patient for  q.12-hour on Lovenox dosing and daily INR checks.  These results should  be called to me, Dr. Myrtie Soman at my pager, [phone] daily until he  follows up with me on June 8.   It is unclear as to the out etiology of these de novo pulmonary emboli  in this patient.  Further workup for hypercoagulable state will be  considered as an outpatient after the patient has been on oral  anticoagulation for a minimum of 2 weeks.  The patient was counseled to  stop smoking, stop cocaine and marijuana abuse.  It is unclear at this  time if he intends to do so.      Myrtie Soman, MD  Electronically Signed      Santiago Bumpers. Leveda Anna, M.D.  Electronically Signed    TE/MEDQ  D:  08/21/2008  T:  08/21/2008  Job:  [id number]

## 2010-08-03 NOTE — Discharge Summary (Signed)
[name], [name]                  ACCOUNT NO.:  [id number]   MEDICAL RECORD NO.:  [id number]          PATIENT TYPE:  INP   LOCATION:  4735                         FACILITY:  [facility]   PHYSICIAN:  Wayne A. Sheffield Slider, M.D.    DATE OF BIRTH:  [birth year]   DATE OF ADMISSION:  10/22/2008  DATE OF DISCHARGE:  10/23/2008                               DISCHARGE SUMMARY   PRIMARY CARE Namrata Dangler:  Myrtie Soman, MD   DISCHARGE DIAGNOSES:  1. Cocaine overdose.  2. Syncopal episode.  3. Supratherapeutic Coumadin level.  4. Possible suicidal ideation.   DISCHARGE MEDICATIONS:  Coumadin 5 mg p.o. daily.   CONSULTATIONS:  None.   PROCEDURES:  Chest x-ray on October 22, 2008 showed no acute disease  process.   LABORATORY DATA:  On admission, the patient's BMP was within normal  limits.  Cardiac markers x3 were all negative.  The patient's UDS was  positive for cocaine.  The patient's acetaminophen level within normal  limits.  Salicylate level within normal limits.  Alcohol level less than  point was 75.  Liver function tests within normal liver function tests  showed AST high at 50, ALT high at 132 otherwise within normal limits.  INR in emergency department 3.4.   BRIEF HOSPITAL COURSE:  This patient is 35 year old male with known  bilateral pulmonary embolisms admitted for a cocaine overdose and  syncopal episode.  Please refer the full H and P for details of initial  presentation.  1. Cocaine abuse.  The patient was admitted overnight for observation.      Due to lack of cardiac symptoms, the patient was less concerning      for cardiac failure stemming from his cocaine use.  EKG was      negative x2.  His cardiac enzymes were negative x3.  CBC returned      within normal limits.  CMET both in the emergency department and      morning of admission were within normal limits.  As the patient      suffered no acute events overnight, he was therefore able to be      discharged to home the next  day in good medical condition.  2. Syncopal episode.  Syncope was most likely vasovagal secondary to      her exertion symptom and the patient's cocaine abuse.  The patient      had not eaten and mostly drank, or slept the night before his      admission.  Cardiac enzymes were negative x3.  Repeat EKG was      negative.  The patient did not experience any loss of consciousness      or similar syncopal episodes while admitted.  3. Coumadin therapy.  The patient's INR was 3.4 upon admission.  The      patient's INR was 3.7 the next hospital day.  The patient had not      received Coumadin while in-house.  The patient's Coumadin was being      held due to high INR.  The patient was recommended to  have his INR      checked the next day after discharge.  Plan was to continue the      patient on 5 mg of Coumadin outpatient and follow up with his PCP      to review his Coumadin medications.  4. Possible suicidal ideation.  The patient experienced remorse and      stated he really did not want to hurt himself after the cocaine      incident.  The patient stated he had been feeling very depressed      for the past 2-3 months before this episode occurred and that this      was a culminating event in his recent frustrations.  The patient is      recommended to followup with Psychiatry outpatient.  While in-house      Outpatient Psychiatry was attempted to be contacted multiple times      but were unable to be contacted from the hospital.  Therefore, the      patient is recommended to followup either with his outpatient      primary care physician as a psychiatric consult or to followup on      his with Outpatient Psychiatry.   DISCHARGE INSTRUCTIONS:  The patient was instructed to have his INR  checks the day after discharge at [facility].  The patient  was also advised to followup with his primary care physician within 1  week.  The patient was advised to followup with Outpatient  Psychiatry  within 1 week.  The patient was told to call the primary care physician  or return to the emergency department if he experience any chest pain,  respiratory distress, shortness of breath, any loss of consciousness, or  any other concerns.   PENDING RESULTS:  None.   FOLLOWUP APPOINTMENTS:  The patient has a followup appointment at [facility] tomorrow, Friday, October 25, 2008 at 4:00 p.m. to have  his Coumadin checked.  The patient has a followup appointment next  Wednesday with Dr. Rexene Alberts at [facility], Wednesday,  October 29, 2008 at 9:15 a.m.   DISCHARGE CONDITION:  The patient was discharged to home in good medical  condition.      Renold Don, MD  Electronically Signed      Arnette Norris. Sheffield Slider, M.D.  Electronically Signed    JW/MEDQ  D:  10/23/2008  T:  10/24/2008  Job:  [id number]   cc:   Myrtie Soman, MD

## 2010-08-03 NOTE — H&P (Signed)
[name], [name]                  ACCOUNT NO.:  [id number]   MEDICAL RECORD NO.:  [id number]          PATIENT TYPE:  INP   LOCATION:  4735                         FACILITY:  [facility]   PHYSICIAN:  Wayne A. Sheffield Slider, M.D.    DATE OF BIRTH:  04/04/1975   DATE OF ADMISSION:  10/22/2008  DATE OF DISCHARGE:  10/23/2008                              HISTORY & PHYSICAL   HISTORY OF PRESENT ILLNESS:  This is a 35 year old male with a known  history of bilateral PE who presents to the emergency department tonight  with 1-day history of cocaine use.  The patient states that he started  using cocaine the night prior to admission about 7 p.m. in the evening  and continued until 1 p.m. today, the day of admission.  Midway through  cocaine use, the patient states that he no longer had anything to look  for and continued taking more and more cocaine in an attempt to hurt  himself.  After the drug use, he stated he was hungry and ordered some  food.  The patient was coming downstairs to wait for food, and while  coming downstairs he had a syncopal episode.  He states that while  coming downstairs, he felt lightheaded, felt warm, and the next thing he  remembers he woke up in a pile of laundry at the bottom of the stairs.  He states that he thinks he fell about 4 steps and that he was out  between 5-10 minutes.  The patient did not hit his head, did not feel  that he injured himself in the fall.  When he woke up, the delivery man  was at the door, and he ate some of this food at that point.  He stated  that he was still feeling some shortness of breath later on in the  afternoon, sensation of tightness in his neck extending to his head, and  still felt warm and therefore he called EMS.  EMS took him to the  emergency department.  Presently, he states that his shortness of breath  has resolved, still some tightness in his neck.  Denies any chest pain  now or at any time during the past day during cocaine use  or after.  No  nausea, vomiting, diarrhea, or abdominal pain.  He states he no longer  wishes to hurt himself and feels remorse for his actions.   Also, the patient was diagnosed with left PE, diagnosed on hospital  admission on Aug 18, 2008.  The patient did not take Coumadin as  directed upon discharge and stopped taking his Coumadin for a total of 2-  3 weeks.  The patient was readmitted in July again with bilateral PE.  Since then, the patient states he has been taking his Coumadin as  directed.  INR today on admission was 3.4.   CURRENT PROBLEMS:  1. Nondependent cocaine abuse.  2. Pulmonary embolism.  3. Coumadin therapy.   CURRENT MEDICATIONS:  Coumadin 5 mg tablets.   ALLERGIES:  No known drug allergies.   PAST MEDICAL HISTORY:  1. Pulmonary  emboli, bilateral left lower lobe and lingula, diagnosed      on Aug 18, 2008, on Coumadin.  2. Tobacco abuse.  3. Cocaine abuse.  4. THC abuse.   PAST SURGICAL HISTORY:  None.   FAMILY HISTORY:  Mother unknown.  Father had an MI, died at age 29.  Grandmother with hypertension, diabetes, and colon cancer diagnosed at  age 71.   SOCIAL HISTORY:  The patient lives alone, works at [employer]  He reports being a smoker, smokes a half pack a day.  Also reports  cocaine abuse very infrequently, per the patient.  Denies any ethanol  use and also reports history of THC abuse.  The patient plays piano and  organ at his church for recreation.   REVIEW OF SYSTEMS:  GENERAL:  Denies chills, fatigue, and fever.  EYES:  Denies blurring, discharge, double vision.  ENT:  Denies decreased  hearing, difficulty swallowing, and ear discharge.  CV:  Denies bluish-  colored discoloration of lips or nails, chest pain or discomfort,  difficulty breathing at night, or palpitations.  RESPIRATIONS:  Denies  chest discomfort, chest pain with inspiration, cough, or shortness of  breath.  GI:  Denies abdominal pain, diarrhea, nausea, or vomiting.   MUSCULOSKELETAL:  Denies joint swelling, muscle aches, muscle weakness.   PHYSICAL EXAMINATION:  VITAL SIGNS:  Temperature 97.8 degrees  Fahrenheit, pulse rate 91, respirations 18 per minute, blood pressure  supine was 137/82, and O2 97% on room air saturation.  GENERAL:  The patient is well developed, well nourished, in no acute  distress, alert, very cooperative throughout examination.  HEAD:  Normocephalic and atraumatic without obvious abnormalities.  No  apparent alopecia or balding.  EYES:  No corneal or conjunctival inflammation noted.  PERRLA.  EOMI.  Funduscopic exam benign, without hemorrhages,  exudates, papilledema.  Vision grossly normal.  EARS:  External ear exam shows no significant lesions or deformities.  Hearing is grossly normal bilaterally.  Otoscopic exam reveals clear  canals.  Tympanic membranes intact bilaterally without bulging.  MOUTH:  Oral mucosa and oropharynx without lesions, erythema, or  exudates.  Teeth in good repair.  NECK:  No deformities, masses, or tenderness noted.  LUNGS:  Normal respiratory effort.  Lungs clear to auscultation  bilaterally.  No crackles or wheezing.  HEART:  Regular rate and rhythm.  S1 and S2 normal without gallop,  murmur, or clicks.  ABDOMEN:  Bowel sounds positive.  Abdomen was soft, nontender without  masses, organomegaly, or hernias noted.  MUSCULOSKELETAL:  No deformity or scoliosis noted of thoracic or lumbar  spine.  PULSES:  Right and left distal pulses palpable bilaterally.  EXTREMITIES:  No clubbing, cyanosis, or edema.  NEUROLOGIC:  No cranial nerve deficits noted.  Sensation and gait all  normal.  Plantar reflexes are downgoing bilaterally.  DTRs are  symmetrical throughout.  Sensory, motor, and coordinative functions  appear intact.  SKIN:  Intact without suspicious lesions or rashes.  PSYCH:  Cognition and judgment appear intact.  Alert and cooperative  with normal attention span and concentration.  No  apparent delusions or  hallucinations.  The patient feels remorse for previous actions.  Denies  any suicidal or homicidal ideations at this time.   LABORATORY DATA:  BMP shows sodium 139, potassium 3.5, chloride 107,  bicarb 23, BUN 4, creatinine 1, and glucose 97.  Cardiac enzymes negative x2.  Coags:  INR 3.4, PT 33.9, and PTT 46.  LFTs:  Bili 0.8, alk phos  72, AST 50, ALT 132, protein 6.8, and albumin  4.  UDS positive for cocaine, otherwise negative.  Ethanol level is below  0.5.  Salicylate level within normal limits.  Acetaminophen level within normal limits.   IMAGING:  Chest x-ray showed no acute disease.   CT angiogram of chest showed:  1. No evidence for acute pulmonary embolus.  2. Left lower lobe pulmonary nodule measures 3.9 mm.  Followup CT of      chest is recommended in 1 year.   CT of the abdomen, no acute upper abdominal CT findings.   ASSESSMENT AND PLAN:  This is a 35 year old patient admitted for  syncopal episode and cocaine overdose.   PROBLEMS:  1. Cocaine overdose.  Plan to admit the patient overnight for      observation.  Due to lack of symptoms, the patient is less      concerning for cardiac failure stemming from his cocaine use.  EKG      negative in ED.  Plan to continue cardiac enzymes x3 to assess      cardiac status.  We will check CBC for anemia or any changes in his      WBC.  CMET for electrolyte status, kidney function as the patient      may have been down longer than he realized, and we would worry for      rhabdomyolysis, especially with history of cocaine use.  Also check      PT, PTT, INR to follow up his Coumadin dosage in a.m.  Also plan to      repeat EKG in a.m.  2. Coumadin therapy.  INR currently at 3.4.  The patient has not taken      Coumadin today.  We will hold today's dose as well as tomorrow's      dose.  CT scan in ED showed no evidence of PE from prior admission.      We will reevaluate Coumadin use after tomorrow  based on a.m. labs.  3. Syncopal episode.  Syncope most likely vasovagal secondary to      overexertion stemming from cocaine abuse.  No evidence of trauma to      body based on CT scans of chest and abdomen done in ED.  The      patient also had not slept the night before and also had not eaten      for previous day.  We will cycle cardiac enzymes and repeat EKG in      a.m.  4. Possible suicidal ideation.  We will consult Psych in a.m. to talk      with the patient.  The patient stated he had been having      depressive symptoms due to increased stress for the past 3 months      prior to last night.  Not currently suicidal, wants to live, and      feels remorse for his actions.  5. FEN/GI.  The patient is currently tolerating p.o. intake.  Plan to      continue p.o. intake as tolerated.  No need for IV maintenance      fluid at this time.   DISPOSITION:  Probably discharge the patient tomorrow pending morning  labs, EKG negative, cardiac enzymes negative x3, and good clinical  appearance.      Renold Don, MD  Electronically Signed      Arnette Norris. Sheffield Slider, M.D.  Electronically Signed    JW/MEDQ  D:  10/23/2008  T:  10/24/2008  Job:  [id number]

## 2010-08-03 NOTE — Discharge Summary (Signed)
NAMECORMAC, [name]                  ACCOUNT NO.:  [id number]   MEDICAL RECORD NO.:  [id number]          PATIENT TYPE:  INP   LOCATION:  4737                         FACILITY:  [facility]   PHYSICIAN:  Nestor Ramp, MD        DATE OF BIRTH:  1975/04/07   DATE OF ADMISSION:  10/01/2008  DATE OF DISCHARGE:  10/05/2008                               DISCHARGE SUMMARY   PRIMARY CARE PHYSICIAN:  Myrtie Soman, MD   REASON FOR ADMISSION:  Chest pain.   PRIMARY DISCHARGE DIAGNOSES:  1. Pulmonary embolism.  2. Tobacco abuse.  3. Cocaine abuse.   MEDICATIONS AT DISCHARGE:  The patient takes Coumadin 2.5 mg tab, take 3  tabs on Sunday, Monday, and Tuesday.  Have INR checked at [facility] on Tuesday and the doctor there will tell him how  much to take afterwards.  The patient is to take Lovenox 150 mg subcu  b.i.d.  He is to have his friend help with this until Tuesday, and he is  to call [facility] if any problems or concerns at  [phone].   PROBLEM:  1. Chest pain.  The patient presented to the emergency department on      October 01, 2008 with chest pain.  Pain increased with deep      inspiration, nothing seemed to relieve pain.  He was recently      admitted, the last hospital admission was Aug 18, 2008, when he was      diagnosed with left pulmonary embolism.  He was to be taking      Coumadin, but had stopped taking his Coumadin as directed 2-3 weeks      prior to this admission.  His symptoms have resolved and it seems      like he need not take that anymore.  In the emergency department,      his D-dimer was 1.08.  The CT showed bilateral PE.  The patient was      started on Lovenox and Coumadin 2 mg by mouth x1.  These were dosed      by pharmacy during hospital admission.  We did not do a full workup      for source of PE since this could not change our management at this      point but would be something that would be considered as an  outpatient in the future.  The EKG showed rate of 103, negative S-      wave in lead 1 and negative T-waves in lead 3 and questionable      positive Q-wave in lead 3.  The repeat EKG was reassuring in the      a.m. following admission.  Throughout hospitalization, the      patient's chest pain resolved completely.  He denied any shortness      of breath as he was being bridged from Lovenox to Coumadin.  Point-      of-care cardiac enzymes were negative x2.  The 3 cycles of cardiac  enzymes during admission after moved up to the floor were also      negative.  The patient is to return to [facility] for      strict management of Coumadin and INR management.  The patient      reeducated on the importance of adhering to the Coumadin therapy in      order to prevent further problems or possible death.  2. Nondependant cocaine abuse.  The patient feels he is not abusing      the medication and states he is not depending on it.  Obtained      social work consult where Child psychotherapist discussed further during      hospitalization.  The patient also discouraged from using cocaine      in the future.  Discussed the possible health-related issues that      could occur from using drug such as cocaine.   The patient discharged home with strict followup with [facility].  The patient is also to see [facility] in 1-2 weeks.      Ellin Mayhew, MD  Electronically Signed      Nestor Ramp, MD  Electronically Signed    DC/MEDQ  D:  10/07/2008  T:  10/08/2008  Job:  [id number]

## 2010-08-03 NOTE — H&P (Signed)
NAMEROWEN, James NO.:  [id number]   MEDICAL RECORD NO.:  [id number]          PATIENT TYPE:  INP   LOCATION:  4737                         FACILITY:  [facility]   PHYSICIAN:  Paula Compton, MD        DATE OF BIRTH:  December 17, 1975   DATE OF ADMISSION:  10/01/2008  DATE OF DISCHARGE:                              HISTORY & PHYSICAL   PRIMARY CARE PHYSICIAN:  Myrtie Soman, MD   CHIEF COMPLAINT:  Chest pain.   HISTORY OF PRESENT ILLNESS:  A 35 year old male with a history of left  pulmonary embolism which was diagnosed on last hospital admission,  08/18/2008.  Having been taken his Coumadin as directed, last dose was  taken about 2-3 weeks ago.  The patient started having sharp chest pain  today around 11 a.m. on his right side.  Pain radiated up to the neck  and down his right arm.  Pain was increased with deep inspiration,  nothing relieved the pain.  The patient also stated that it heart  started racing.  He had some shortness of breath and despite drinking  about a liter of fluids he has been out his blood.  The patient was  able to urinate, so he did not call the EMS.  He was brought to [facility] and on CT, was found to have bilateral PEs.   He has history of cocaine use.  Urine drug screen was positive for  cocaine today, denies any previous chest pain prior to today.  Denies  nausea, vomiting, diarrhea, constipation, cough, cold, hemoptysis,  painful urination, or changes in vision.   Alcohol/tobacoo, the patient currently smokes about a half a pack per  day.   No known drug allergies.   MEDICINES:  The patient currently takes no medicines.   PAST MEDICAL HISTORY:  1. Pulmonary emboli, left lower lobe and lingula, diagnosed      08/18/2008.  The patient was supposed to be on 5 mg Coumadin as      directed.  2. Tobacco abuse.  3. Cocaine abuse.  4. THC abuse.   PAST SURGICAL HISTORY:  None.   FAMILY HISTORY:  Mother unknown.  Father, MI at age 98.  Grandmother,  hypertension, diabetes, colon cancer.   SOCIAL HISTORY:  Lives alone.  Works with [employer].  He is a  smoker, cocaine abuse, and THC abuse.  Plays piano and organizes church.   REVIEW OF SYSTEMS:  Please see the HPI for detailed review of systems.   PHYSICAL EXAMINATION:  GENERAL:  Well developed, well nourished, in no  acute distress, alert, oriented, and cooperative throughout the exam.  HEAD:  Normocephalic and atraumatic without obvious abnormalities.  MOUTH:  Oral mucosa and oropharynx without lesions or exudates.  NECK:  No deformities, masses, or tenderness noted.  No JVD.  CHEST WALL:  No deformities, masses, or tenderness.  LUNGS:  Normal respiratory effort.  Chest expands symmetrically.  Right  upper lobe has crackles.  HEART:  Regular rate and rhythm.  S1 and S2 normal without gallop,  murmur,  click, rub, or other extra sounds.  ABDOMEN:  Bowel sounds positive x4.  Abdomen soft and nontender.  No  masses, organomegaly, or hernias.  MUSCULOSKELETAL:  There is no deformities noted.  Range of motion was  full.  No joint tenderness, swelling, or warmth.  Pulses, right and left  carotid, radial dorsalis pedis were full and equal bilaterally.  EXTREMITIES:  No clubbing, cyanosis, or edema was noted.  NEUROLOGIC:  No cranial nerve deficits were noted.  SKIN:  Intact without suspicious lesions or rashes.  PSYCH:  Cognition and judgment appear intact.   LABS:  Point of care cardiac enzymes were negative x2.  UA was negative.  D-dimer 1.08, INR 1.0.  UDS was positive for cocaine.  CBC showed white  blood cell count of 11.8, hemoglobin 15.2, hematocrit 45.1, platelets  149.   IMPRESSIONS AND PLAN:  1. Pulmonary embolism.  CT showed bilateral PE.  The patient further      admitted to noncompliance of Coumadin, has not taken from  2-3      weeks.  We will start him on Lovenox and Coumadin 10 mg by mouth      p.o. now and then dosed by pharmacy until  therapeutic.  INR is      reached (2-3).  We will get INR tomorrow.  We will consider      followup workup for the source of this PEs, but that will not      change our current management.  EKG showed a rate of 103, negative      S wave in lead 1, negative T-wave in lead 3 and questionable      positive Q-wave in lead 3.  We will get a repeat EKG in the a.m. to      evaluate for any acute changes.  2. Chest pain.  The patient currently was pain free when he was      examined.  Chest pain was probably due to his PEs.  Point of care      cardiac enzymes were negative x2.  We will cycle his enzymes      overnight x3 q.8 h. and we will continue to monitor Tylenol p.r.n.      for pain.  Plan is same is above.  3. Nondependent cocaine abuse, episodic.  Discussed abuse with the      patient. Says he is not dependent on cocaine.  At this time, we      will get a Social Work consult on follow up with this.  4. Fluids, electrolytes, nutrition/Gastrointestinal.  IV saline      locked, regular diet.  5. Prophylaxis.  The patient is already on heparin for PEs.  6. Code status.  The patient is full code.  7. Disposition.  Pending resolution of symptoms.      Alvia Grove, DO  Electronically Signed      Paula Compton, MD  Electronically Signed    BB/MEDQ  D:  10/01/2008  T:  10/02/2008  Job:  [id number]

## 2010-08-03 NOTE — H&P (Signed)
[name], [name]                  ACCOUNT NO.:  [id number]   MEDICAL RECORD NO.:  [id number]          PATIENT TYPE:  INP   LOCATION:  3703                         FACILITY:  [facility]   PHYSICIAN:  Santiago Bumpers. Hensel, M.D.DATE OF BIRTH:  05/25/1975   DATE OF ADMISSION:  08/18/2008  DATE OF DISCHARGE:                              HISTORY & PHYSICAL   PRIMARY CARE PHYSICIAN:  None.   CHIEF COMPLAINT:  Chest pain.   HISTORY OF PRESENT ILLNESS:  The patient is a 35 year old with no past  medical history at the onset of dull substernal chest pain after  snorting cocaine last night.  The pain described as a dull, tightness,  substernal and left chest pain that is worse with a significant  exertion, but not worsened with minimal exertion.  The pain has been  constant.  The patient tried Gas-X without relief.  No significant  shortness of breath, but he does have pain, worse on the deep  inspiration.  This morning he began to have some blood-tinged sputum  estimates about half a tablespoon times several times.  He has never had  this type of pain before.  He denies nausea, vomiting, diaphoresis, long  care trips, family history or personal history of clots or  personal  history of cancer.  In the ER, he got Ativan a 1500 cc bolus and 324 mg  of chewable aspirin.   PAST MEDICAL HISTORY:  None.   PAST SURGICAL HISTORY:  None.   ALLERGIES:  No known drug allergies.   MEDICATIONS:  None.   SOCIAL HISTORY:  Lives alone.  Occupation:  Works at [employer].   TOBACCO HISTORY:  He smoked about half-a-pack per day for the past 3  years.  Alcohol, he drinks 1-2 drinks per week.  Drugs, he used cocaine  last night, but he reports that the last use prior to that was about 4  years ago.   FAMILY MEDICAL HISTORY:  Mother is unknown.  Father had an MI at age 76,  deceased from this.  The patient does not know of any underlying or  genetic heart disease with the father.  Siblings, he has no  siblings.  He has no kids.  He does have a grandmother, who had hypertension,  diabetes, and colon cancer diagnosed at age 54.   REVIEW OF SYSTEMS:  Other than above, the patient has had fatigue, but  only since the onset of these symptoms and a small round nonpruritic  area of rash on his left forearm x1 month.  Other than that, review of  system is per HPI.  He denies fevers, chills, sweats, appetite changes,  headache, sore throat, edema, orthopnea, nausea, vomiting, bright red  blood per rectum, melena, dysuria, hematuria, myalgias, weakness, or  vision changes.   PHYSICAL EXAMINATION:  VITAL SIGNS:  Temperature 98.5, pulse 98-105,  respirations 18-20, blood pressure 122-133/70-89, and pulse ox 94-97% on  room air.  GENERAL: He is in no acute distress.  He has a little bit of flat  affect.  HEENT:  Normocephalic, atraumatic.  Pupils  equally round and reactive to  light.  Extraocular movements are intact.  Oropharynx is clear.  NECK:  Supple.  No bruits.  No JVD.  CARDIOVASCULAR:  He is mildly tachycardiac with a regular rhythm.  No  murmur.  LUNGS:  Clear to auscultation bilaterally.  ABDOMEN:  Soft, nontender, and nondistended.  Normoactive bowel sounds.  EXTREMITIES:  Lower extremity showed no edema.  No calf tenderness.  No  palpable cords.  Both calves appear symmetric.  He  is in negative Homan  sign.   LABORATORY DATA AND STUDIES:  Sodium 138, potassium 4.0, chloride 105,  BUN 13, creatinine 1.8, and glucose 91.  White blood cell count is  elevated at 13.2, hemoglobin 16.1, and platelets 115.  D-dimer is  elevated at 2.14.  Point of care cardiac enzymes negative x1.  CT angio  acute lingular and left lower lobe pulmonary emboli without evidence of  right heart strain.  EKG showed sinus tachycardia with a rate of 103.  It showed some diffuse ST elevations about 1 mm and less that appear  essentially unchanged and compared with an EKG from 2006.   ASSESSMENT AND PLAN:  A  35 year old with PE and renal insufficiency.  1. Pulmonary emboli seen on CTA.  The only risk factor that I can      identify for the patient is smoking.  There is no symptoms to      indicate malignancy.  No family or personal history of clots.  No      medications that increased clotting or there is no travel history.      Unfortunately, I did not request a hypercoagulable panel be drawn      prior to the initiation of heparin drip in the ER.  Although I do      not think it would change our treatment at all.  Heparin drip has      been started in the ER in the morning.  I am going to write for a      change to Lovenox and Coumadin, simply because using Lovenox may      allow [place] to discharge him a few days earlier, if he is able to get      home Lovenox.  Although I am not sure this is will be the case      since he does not have any insurance at this time.  He is      hemodynamically stable, so I do not think he really needs an echo.      I am going to go ahead and check cardiac markers and an a.m. EKG.  2. Renal failure.  He has creatinine of 1.8.  Unfortunately, there is      no previous creatinine values in E-chart per the patient as far as      he knows he has had normal renal function, so I am not certain if      this is acute or chronic.  My hypothesis at this time is that it is      probably an acute decrease in his renal function due to a decrease      in perfusion from this acute event associated with a PE.  I am      going to go and give him IV fluids and see if his renal function      improves.  We will check an a.m. BMET.  If he does improve over the  next day or 2, he may need a workup for acute renal failure.  3. Family history of coronary artery disease.  He had a father who      deceased from an MI at 77 and check a fasting lipid panel and      cycles his enzymes as above.  4. Tobacco dependence.  Get a smoking cessation consult.  5. Prophylaxis.  We will do heparin  tonight in the morning, switched      to Lovenox, and go ahead and give him Protonix for GI prophylaxis.   DISPOSITION:  The patient will need to be bridged with Lovenox or  heparin.  He will need to be on at least 5 days of Lovenox or heparin,  even if he is therapeutic on his Coumadin.  He does not have a PCP and  also need a place to follow up for his PT/INR.      Asher Muir, MD  Electronically Signed      Santiago Bumpers. Leveda Anna, M.D.  Electronically Signed    SO/MEDQ  D:  08/18/2008  T:  08/19/2008  Job:  [id number]

## 2011-08-10 ENCOUNTER — Encounter (HOSPITAL_COMMUNITY): Payer: Self-pay | Admitting: Emergency Medicine

## 2011-08-10 ENCOUNTER — Emergency Department (HOSPITAL_COMMUNITY): Payer: Self-pay

## 2011-08-10 ENCOUNTER — Inpatient Hospital Stay (HOSPITAL_COMMUNITY)
Admission: EM | Admit: 2011-08-10 | Discharge: 2011-08-12 | DRG: 176 | Disposition: A | Discharge status: Home / Self Care | Payer: Self-pay | Attending: Internal Medicine | Admitting: Internal Medicine

## 2011-08-10 DIAGNOSIS — R079 Chest pain, unspecified: Secondary | ICD-10-CM | POA: Diagnosis present

## 2011-08-10 DIAGNOSIS — D696 Thrombocytopenia, unspecified: Secondary | ICD-10-CM | POA: Diagnosis present

## 2011-08-10 DIAGNOSIS — I2699 Other pulmonary embolism without acute cor pulmonale: Secondary | ICD-10-CM

## 2011-08-10 DIAGNOSIS — M311 Thrombotic microangiopathy: Secondary | ICD-10-CM

## 2011-08-10 DIAGNOSIS — F17200 Nicotine dependence, unspecified, uncomplicated: Secondary | ICD-10-CM | POA: Diagnosis present

## 2011-08-10 DIAGNOSIS — F1410 Cocaine abuse, uncomplicated: Secondary | ICD-10-CM | POA: Diagnosis present

## 2011-08-10 HISTORY — DX: Other pulmonary embolism without acute cor pulmonale: I26.99

## 2011-08-10 LAB — BASIC METABOLIC PANEL
BUN: 11 mg/dL (ref 6–23)
GFR calc Af Amer: 87 mL/min — ABNORMAL LOW (ref >90)
GFR calc non Af Amer: 75 mL/min — ABNORMAL LOW (ref >90)
Potassium: 3.6 mEq/L (ref 3.5–5.1)

## 2011-08-10 LAB — CBC
Hemoglobin: 15.9 g/dL (ref 13.0–17.0)
MCH: 30.6 pg (ref 26.0–34.0)
MCH: 29.7 pg (ref 26.0–34.0)
MCHC: 35.3 g/dL (ref 30.0–36.0)
MCHC: 34.1 g/dL (ref 30.0–36.0)
MCV: 87.1 fL (ref 78.0–100.0)
Platelets: 149 10*3/uL — ABNORMAL LOW (ref 150–400)
Platelets: 134 10*3/uL — ABNORMAL LOW (ref 150–400)
RBC: 4.81 MIL/uL (ref 4.22–5.81)
RDW: 13.6 % (ref 11.5–15.5)

## 2011-08-10 LAB — COMPREHENSIVE METABOLIC PANEL
AST: 15 U/L (ref 0–37)
CO2: 27 mEq/L (ref 19–32)
Calcium: 9 mg/dL (ref 8.4–10.5)
Creatinine, Ser: 1.3 mg/dL (ref 0.50–1.35)
GFR calc Af Amer: 82 mL/min — ABNORMAL LOW (ref >90)
GFR calc non Af Amer: 70 mL/min — ABNORMAL LOW (ref >90)
Glucose, Bld: 125 mg/dL — ABNORMAL HIGH (ref 70–99)
Total Protein: 6.4 g/dL (ref 6.0–8.3)

## 2011-08-10 LAB — MAGNESIUM: Magnesium: 2.1 mg/dL (ref 1.5–2.5)

## 2011-08-10 LAB — DIFFERENTIAL
Basophils Relative: 0 % (ref 0–1)
Eosinophils Absolute: 0.2 10*3/uL (ref 0.0–0.7)
Monocytes Relative: 11 % (ref 3–12)
Neutrophils Relative %: 73 % (ref 43–77)

## 2011-08-10 LAB — POCT I-STAT, CHEM 8
Chloride: 101 mEq/L (ref 96–112)
Creatinine, Ser: 1.4 mg/dL — ABNORMAL HIGH (ref 0.50–1.35)
Glucose, Bld: 106 mg/dL — ABNORMAL HIGH (ref 70–99)
Hemoglobin: 16.7 g/dL (ref 13.0–17.0)
Potassium: 3.7 mEq/L (ref 3.5–5.1)

## 2011-08-10 LAB — PROTIME-INR
INR: 1.05 (ref 0.00–1.49)
Prothrombin Time: 13.8 seconds (ref 11.6–15.2)

## 2011-08-10 LAB — PHOSPHORUS: Phosphorus: 3.8 mg/dL (ref 2.3–4.6)

## 2011-08-10 LAB — TSH: TSH: 3.05 u[IU]/mL (ref 0.350–4.500)

## 2011-08-10 MED ORDER — ALBUTEROL SULFATE (5 MG/ML) 0.5% IN NEBU
2.5000 mg | INHALATION_SOLUTION | Freq: Four times a day (QID) | RESPIRATORY_TRACT | Status: DC
  Administered 2011-08-10: 2.5 mg via RESPIRATORY_TRACT
  Filled 2011-08-10: qty 0.5

## 2011-08-10 MED ORDER — IBUPROFEN 400 MG PO TABS
400.0000 mg | ORAL_TABLET | Freq: Three times a day (TID) | ORAL | Status: DC
  Administered 2011-08-10 – 2011-08-12 (×5): 400 mg via ORAL
  Filled 2011-08-10 (×10): qty 1

## 2011-08-10 MED ORDER — IPRATROPIUM BROMIDE 0.02 % IN SOLN
0.5000 mg | Freq: Four times a day (QID) | RESPIRATORY_TRACT | Status: DC
  Administered 2011-08-10: 0.5 mg via RESPIRATORY_TRACT
  Filled 2011-08-10: qty 2.5

## 2011-08-10 MED ORDER — ALBUTEROL SULFATE (5 MG/ML) 0.5% IN NEBU: 2.5000 mg | INHALATION_SOLUTION | RESPIRATORY_TRACT | Status: DC | PRN

## 2011-08-10 MED ORDER — ASPIRIN EC 325 MG PO TBEC
325.0000 mg | DELAYED_RELEASE_TABLET | Freq: Once | ORAL | Status: AC
  Administered 2011-08-10: 325 mg via ORAL
  Filled 2011-08-10: qty 1

## 2011-08-10 MED ORDER — SODIUM CHLORIDE 0.9 % IJ SOLN
3.0000 mL | Freq: Two times a day (BID) | INTRAMUSCULAR | Status: DC
  Administered 2011-08-10 – 2011-08-12 (×5): 3 mL via INTRAVENOUS

## 2011-08-10 MED ORDER — DOCUSATE SODIUM 100 MG PO CAPS
100.0000 mg | ORAL_CAPSULE | Freq: Two times a day (BID) | ORAL | Status: DC
  Administered 2011-08-10 – 2011-08-12 (×4): 100 mg via ORAL
  Filled 2011-08-10 (×6): qty 1

## 2011-08-10 MED ORDER — WARFARIN VIDEO
Freq: Once | Status: AC
  Administered 2011-08-10: 12:00:00

## 2011-08-10 MED ORDER — KETOROLAC TROMETHAMINE 30 MG/ML IJ SOLN
30.0000 mg | Freq: Once | INTRAMUSCULAR | Status: AC
  Administered 2011-08-10: 30 mg via INTRAVENOUS
  Filled 2011-08-10: qty 1

## 2011-08-10 MED ORDER — WARFARIN - PHARMACIST DOSING INPATIENT: Freq: Every day | Status: DC

## 2011-08-10 MED ORDER — SODIUM CHLORIDE 0.9 % IV SOLN
Freq: Once | INTRAVENOUS | Status: AC
  Administered 2011-08-10: 1000 mL via INTRAVENOUS

## 2011-08-10 MED ORDER — ENOXAPARIN SODIUM 120 MG/0.8ML ~~LOC~~ SOLN
110.0000 mg | Freq: Two times a day (BID) | SUBCUTANEOUS | Status: DC
  Administered 2011-08-10 – 2011-08-12 (×5): 110 mg via SUBCUTANEOUS
  Filled 2011-08-10 (×8): qty 0.8

## 2011-08-10 MED ORDER — ONDANSETRON HCL 4 MG/2ML IJ SOLN: 4.0000 mg | Freq: Three times a day (TID) | INTRAMUSCULAR | Status: DC | PRN

## 2011-08-10 MED ORDER — ONDANSETRON HCL 4 MG/2ML IJ SOLN: 4.0000 mg | Freq: Four times a day (QID) | INTRAMUSCULAR | Status: DC | PRN

## 2011-08-10 MED ORDER — SODIUM CHLORIDE 0.9 % IV SOLN: INTRAVENOUS | Status: DC

## 2011-08-10 MED ORDER — SODIUM CHLORIDE 0.9 % IV SOLN
INTRAVENOUS | Status: AC
  Administered 2011-08-10: 05:00:00 via INTRAVENOUS

## 2011-08-10 MED ORDER — ACETAMINOPHEN 325 MG PO TABS
650.0000 mg | ORAL_TABLET | Freq: Four times a day (QID) | ORAL | Status: DC | PRN
  Administered 2011-08-10: 650 mg via ORAL
  Filled 2011-08-10: qty 2

## 2011-08-10 MED ORDER — ACETAMINOPHEN 650 MG RE SUPP: 650.0000 mg | Freq: Four times a day (QID) | RECTAL | Status: DC | PRN

## 2011-08-10 MED ORDER — OXYCODONE HCL 5 MG PO TABS
5.0000 mg | ORAL_TABLET | ORAL | Status: DC | PRN
  Administered 2011-08-10 – 2011-08-11 (×3): 5 mg via ORAL
  Filled 2011-08-10 (×3): qty 1

## 2011-08-10 MED ORDER — WARFARIN SODIUM 10 MG PO TABS
10.0000 mg | ORAL_TABLET | Freq: Once | ORAL | Status: AC
  Administered 2011-08-10: 10 mg via ORAL
  Filled 2011-08-10: qty 1

## 2011-08-10 MED ORDER — ASPIRIN EC 81 MG PO TBEC
81.0000 mg | DELAYED_RELEASE_TABLET | Freq: Every day | ORAL | Status: DC
  Administered 2011-08-10 – 2011-08-12 (×3): 81 mg via ORAL
  Filled 2011-08-10 (×3): qty 1

## 2011-08-10 MED ORDER — COUMADIN BOOK
Freq: Once | Status: DC
  Filled 2011-08-10: qty 1

## 2011-08-10 MED ORDER — ZOLPIDEM TARTRATE 5 MG PO TABS: 10.0000 mg | ORAL_TABLET | Freq: Every evening | ORAL | Status: DC | PRN

## 2011-08-10 MED ORDER — IOHEXOL 350 MG/ML SOLN
100.0000 mL | Freq: Once | INTRAVENOUS | Status: AC | PRN
  Administered 2011-08-10: 100 mL via INTRAVENOUS

## 2011-08-10 MED ORDER — ONDANSETRON HCL 4 MG PO TABS: 4.0000 mg | ORAL_TABLET | Freq: Four times a day (QID) | ORAL | Status: DC | PRN

## 2011-08-10 MED ORDER — HYDROMORPHONE HCL PF 1 MG/ML IJ SOLN
1.0000 mg | INTRAMUSCULAR | Status: DC | PRN
  Administered 2011-08-10 – 2011-08-12 (×11): 1 mg via INTRAVENOUS
  Filled 2011-08-10 (×11): qty 1

## 2011-08-10 NOTE — Consult Note (Signed)
Pt smokes 1 ppd and has quit a couple of times cold [place] before. He is in contemplation stage at the moment. Discussed risk factors of smoking and its effects on his health. Pt verbalizes understanding. He is in preparation stage. Referred to 1-800 quit now for f/u and support. Discussed oral fixation substitutes, second hand smoke and in home smoking policy. Reviewed and gave pt Written education/contact information.

## 2011-08-10 NOTE — ED Notes (Signed)
[place] [facility] [place] [phone] 

## 2011-08-10 NOTE — ED Notes (Signed)
Report called to [phone] report given to [facility].

## 2011-08-10 NOTE — Progress Notes (Signed)
ANTICOAGULATION CONSULT NOTE - Initial Consult  Pharmacy Consult for Lovenox/Coumadin Indication: pulmonary embolus  No Known Allergies  Patient Measurements: Weight: 244 lb (110.678 kg)   Vital Signs: Temp: 98 F (36.7 C) (05/22 0407) Temp src: Oral (05/22 0407) BP: 141/80 mmHg (05/22 0407) Pulse Rate: 62  (05/22 0407)  Labs:  Alvira Philips 08/10/11 0143 08/10/11 0137 08/10/11 0059  HGB 16.7 -- 15.9  HCT 49.0 -- 45.1  PLT -- -- 149*  APTT -- 29 --  LABPROT -- 13.9 --  INR -- 1.05 --  HEPARINUNFRC -- -- --  CREATININE 1.40* -- 1.23  CKTOTAL -- -- --  CKMB -- -- --  TROPONINI -- -- --    The CrCl is unknown because both a height and weight (above a minimum accepted value) are required for this calculation.   Medical History: Past Medical History  Diagnosis Date  . PE (pulmonary embolism)     Medications:  Prescriptions prior to admission  Medication Sig Dispense Refill  . ibuprofen (ADVIL,MOTRIN) 200 MG tablet Take 400 mg by mouth every 6 (six) hours as needed. For pain       Scheduled:    . sodium chloride   Intravenous Once  . sodium chloride   Intravenous STAT  . albuterol  2.5 mg Nebulization Q6H  . aspirin EC  325 mg Oral Once  . aspirin EC  81 mg Oral Daily  . docusate sodium  100 mg Oral BID  . enoxaparin (LOVENOX) injection  110 mg Subcutaneous BID  . ipratropium  0.5 mg Nebulization Q6H  . ketorolac  30 mg Intravenous Once  . 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 F/U daily PT/INR  Eddie Candle 08/10/2011,4:23 AM

## 2011-08-10 NOTE — ED Notes (Signed)
Returned from Ct

## 2011-08-10 NOTE — Progress Notes (Signed)
[facility] PROGRESS NOTE  EAN GETTEL ZOX:[id number] DOB: 1975/06/13 DOA: 08/10/2011 PCP: Pcp Not In System  Assessment/Plan: 1. 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 Try to see if he qualifies for patient's assistance on xarelto 2. peluritic chest pain - probably due to lung infarct. Plan for nsaids for pain  3. Tobacco abuse - counseled 4.   Principal Problem:  *PULMONARY EMBOLISM Active Problems:  TOBACCO USER  CHEST PAIN UNSPECIFIED  Thrombocytopenia  Tovah Slavick, MD  [facility] Pager [phone]  If 7PM-7AM, please contact night-coverage [email or web address] Password [email or web address] 08/10/2011, 8:23 AM   LOS: 0 days   Brief narrative: 36 yo man admitted for PE  Consultants:    Procedures:    Antibiotics:    HPI/Subjective: Less chest pain   Objective: Filed Vitals:   08/10/11 0215 08/10/11 0301 08/10/11 0339 08/10/11 0407  BP:   124/75 141/80  Pulse: 75  67 62  Temp:    98 F (36.7 C)  TempSrc:    Oral  Resp: 19   20  Height:    6' 4.5" (1.943 m)  Weight:  110.678 kg (244 lb)  111.9 kg (246 lb 11.1 oz)  SpO2: 98%  98% 97%    Intake/Output Summary (Last 24 hours) at 08/10/11 0823 Last data filed at 08/10/11 0659  Gross per 24 hour  Intake    568 ml  Output      0 ml  Net    568 ml    Exam:   General:  Alert and oriented x3  Cardiovascular: rrr  Respiratory: ctab  Abdomen: soft, nt  Data Reviewed: Basic Metabolic Panel:  Lab 08/10/11 8295 08/10/11 0143 08/10/11 0059  NA 139 139 137  K 3.5 3.7 --  CL 102 101 99  CO2 27 -- 28  GLUCOSE 125* 106* 111*  BUN 10 10 11   CREATININE 1.30 1.40* 1.23  CALCIUM 9.0 -- 9.3  MG 2.1 -- --  PHOS 3.8 -- --   Liver Function Tests:  Lab 08/10/11 0530  AST 15  ALT 25  ALKPHOS 75  BILITOT 0.4  PROT 6.4  ALBUMIN 3.2*   No results found for this basename: LIPASE:5,AMYLASE:5 in the last 168 hours No results found for this basename: AMMONIA:5 in the last 168 hours CBC:  Lab  08/10/11 0530 08/10/11 0143 08/10/11 0059  WBC 10.8* -- 12.1*  NEUTROABS -- -- 8.8*  HGB 14.3 16.7 15.9  HCT 41.9 49.0 45.1  MCV 87.1 -- 86.7  PLT 134* -- 149*   Cardiac Enzymes: No results found for this basename: CKTOTAL:5,CKMB:5,CKMBINDEX:5,TROPONINI:5 in the last 168 hours BNP (last 3 results) No results found for this basename: PROBNP:3 in the last 8760 hours CBG: No results found for this basename: GLUCAP:5 in the last 168 hours  No results found for this or any previous visit (from the past 240 hour(s)).   Studies: Dg Chest 2 View  08/10/2011  *RADIOLOGY REPORT*  Clinical Data: Right-sided chest pain  CHEST - 2 VIEW  Comparison: 12/28/2009 CT  Findings: Cardiomediastinal contours are within normal limits. Lungs are clear.  No pleural effusion or pneumothorax.  No acute osseous finding.  IMPRESSION: No radiographic evidence of acute cardiopulmonary process.  Original Report Authenticated By: Waneta Martins, M.D.   Ct Angio Chest W/cm &/or Wo Cm  08/10/2011  *RADIOLOGY REPORT*  Clinical Data: Right-sided chest pain  CT ANGIOGRAPHY CHEST  Technique:  Multidetector CT imaging of the chest using the standard protocol during bolus administration  of intravenous contrast. Multiplanar reconstructed images including MIPs were obtained and reviewed to evaluate the vascular anatomy.  Contrast:  Comparison: 12/28/2009  Findings: There are pulmonary emboli to the right lower lobe segments as well as right upper lobe segments.  Normal caliber aorta.  Normal heart size. No CT evidence for right ventricular heart strain.  No pleural or pericardial effusion.  No intrathoracic lymphadenopathy.  Central airways are patent.  There is mild paramediastinal opacity at the right lung base.  This was present on the prior.  Favored to represent scarring.  Minimal opacity within the lingula was also present previously, favored to represent scarring. Minimal opacity within the right lower lobe adjacent to the  fissure may reflect scarring or atelectasis.  Otherwise, no new areas of consolidation. No pleural effusion or pneumothorax.  Limited images through the upper abdomen show no acute abnormality.  No acute osseous finding.  IMPRESSION: Right upper and lower lobe branch pulmonary emboli.  No CT evidence for right ventricular heart strain.  Mild right lower lobe and lingular opacities are similar to prior, therefore favored to represent scarring.  Discussed via telephone with Dr. Hyacinth Meeker at 02:40 a.m. on 08/10/2011.  Original Report Authenticated By: Waneta Martins, M.D.    Scheduled Meds:    . sodium chloride   Intravenous Once  . sodium chloride   Intravenous STAT  . albuterol  2.5 mg Nebulization Q6H  . aspirin EC  325 mg Oral Once  . aspirin EC  81 mg Oral Daily  . coumadin book   Does not apply Once  . docusate sodium  100 mg Oral BID  . enoxaparin (LOVENOX) injection  110 mg Subcutaneous BID  . ipratropium  0.5 mg Nebulization Q6H  . ketorolac  30 mg Intravenous Once  . sodium chloride  3 mL Intravenous Q12H  . warfarin  10 mg Oral ONCE-1800  . warfarin   Does not apply Once  . Warfarin - Pharmacist Dosing Inpatient   Does not apply q1800   Continuous Infusions:    . sodium chloride

## 2011-08-10 NOTE — ED Provider Notes (Signed)
History     CSN: [id number]  Arrival date & time 08/10/11  1478   First MD Initiated Contact with Patient 08/10/11 0131      Chief Complaint  Patient presents with  . Chest Pain    (Consider location/radiation/quality/duration/timing/severity/associated sxs/prior treatment) HPI Comments: 36 year old male with a history of pulmonary embolism approximately 2 years ago presents with recurrent right-sided chest pain and right lower extremity swelling. He states that he took anticoagulative therapy for proximally 6 months after the initial pulmonary embolism but has stopped that since that time and was recommended that he did not need further treatment. There is no definitive etiology per the patient for that blood clot. He states that approximately 2 weeks ago he started to develop right lower extremity swelling compared to the left, he denies trauma, travel, immobilization, recent surgery. He does state that approximately 24 hours ago he developed a sharp right-sided chest pain it is anterior, worse with laying supine and worse with deep breathing. The pain is sharp and stabbing, persistent and not exertional. He denies shortness of breath, cough, fever, back pain. He does note that the pain radiates from his right side of his chest to his right posterior shoulder and to the right side of the neck.  Patient is a 36 y.o. male presenting with chest pain. The history is provided by the patient.  Chest Pain     Past Medical History  Diagnosis Date  . PE (pulmonary embolism)     History reviewed. No pertinent past surgical history.  No family history on file.  History  Substance Use Topics  . Smoking status: Current Everyday Smoker  . Smokeless tobacco: Not on file  . Alcohol Use: Yes      Review of Systems  Cardiovascular: Positive for chest pain.  All other systems reviewed and are negative.    Allergies  Review of patient's allergies indicates no known allergies.  Home  Medications   Current Outpatient Rx  Name Route Sig Dispense Refill  . IBUPROFEN 200 MG PO TABS Oral Take 400 mg by mouth every 6 (six) hours as needed. For pain      BP 146/96  Pulse 75  Temp(Src) 98.9 F (37.2 C) (Oral)  Resp 19  Wt 244 lb (110.678 kg)  SpO2 98%  Physical Exam  Nursing note and vitals reviewed. Constitutional: He appears well-developed and well-nourished. No distress.  HENT:  Head: Normocephalic and atraumatic.  Mouth/Throat: Oropharynx is clear and moist. No oropharyngeal exudate.  Eyes: Conjunctivae and EOM are normal. Pupils are equal, round, and reactive to light. Right eye exhibits no discharge. Left eye exhibits no discharge. No scleral icterus.  Neck: Normal range of motion. Neck supple. No JVD present. No thyromegaly present.  Cardiovascular: Normal rate, regular rhythm, normal heart sounds and intact distal pulses.  Exam reveals no gallop and no friction rub.   No murmur heard. Pulmonary/Chest: Effort normal and breath sounds normal. No respiratory distress. He has no wheezes. He has no rales. He exhibits no tenderness.  Abdominal: Soft. Bowel sounds are normal. He exhibits no distension and no mass. There is no tenderness.  Musculoskeletal: Normal range of motion. He exhibits edema ( Right lower extremity with 1+ pitting edema, left lower extremity normal). He exhibits no tenderness.  Lymphadenopathy:    He has no cervical adenopathy.  Neurological: He is alert. Coordination normal.  Skin: Skin is warm and dry. No rash noted. No erythema.  Psychiatric: He has a normal mood and affect.  His behavior is normal.    ED Course  Procedures (including critical care time)  ED ECG REPORT   Date: 08/10/2011   Rate: 83  Rhythm: normal sinus rhythm  QRS Axis: normal  Intervals: normal  ST/T Wave abnormalities: early repolarization  Conduction Disutrbances:none  Narrative Interpretation:   Old EKG Reviewed: unchanged   Labs Reviewed  CBC - Abnormal;  Notable for the following:    WBC 12.1 (*)    Platelets 149 (*)    All other components within normal limits  DIFFERENTIAL - Abnormal; Notable for the following:    Neutro Abs 8.8 (*)    Monocytes Absolute 1.3 (*)    All other components within normal limits  BASIC METABOLIC PANEL - Abnormal; Notable for the following:    Glucose, Bld 111 (*)    GFR calc non Af Amer 75 (*)    GFR calc Af Amer 87 (*)    All other components within normal limits  POCT I-STAT, CHEM 8 - Abnormal; Notable for the following:    Creatinine, Ser 1.40 (*)    Glucose, Bld 106 (*)    All other components within normal limits  APTT  PROTIME-INR  POCT I-STAT TROPONIN I   Dg Chest 2 View  08/10/2011  *RADIOLOGY REPORT*  Clinical Data: Right-sided chest pain  CHEST - 2 VIEW  Comparison: 12/28/2009 CT  Findings: Cardiomediastinal contours are within normal limits. Lungs are clear.  No pleural effusion or pneumothorax.  No acute osseous finding.  IMPRESSION: No radiographic evidence of acute cardiopulmonary process.  Original Report Authenticated By: Waneta Martins, M.D.   Ct Angio Chest W/cm &/or Wo Cm  08/10/2011  *RADIOLOGY REPORT*  Clinical Data: Right-sided chest pain  CT ANGIOGRAPHY CHEST  Technique:  Multidetector CT imaging of the chest using the standard protocol during bolus administration of intravenous contrast. Multiplanar reconstructed images including MIPs were obtained and reviewed to evaluate the vascular anatomy.  Contrast:  Comparison: 12/28/2009  Findings: There are pulmonary emboli to the right lower lobe segments as well as right upper lobe segments.  Normal caliber aorta.  Normal heart size. No CT evidence for right ventricular heart strain.  No pleural or pericardial effusion.  No intrathoracic lymphadenopathy.  Central airways are patent.  There is mild paramediastinal opacity at the right lung base.  This was present on the prior.  Favored to represent scarring.  Minimal opacity within the  lingula was also present previously, favored to represent scarring. Minimal opacity within the right lower lobe adjacent to the fissure may reflect scarring or atelectasis.  Otherwise, no new areas of consolidation. No pleural effusion or pneumothorax.  Limited images through the upper abdomen show no acute abnormality.  No acute osseous finding.  IMPRESSION: Right upper and lower lobe branch pulmonary emboli.  No CT evidence for right ventricular heart strain.  Mild right lower lobe and lingular opacities are similar to prior, therefore favored to represent scarring.  Discussed via telephone with Dr. Hyacinth Meeker at 02:40 a.m. on 08/10/2011.  Original Report Authenticated By: Waneta Martins, M.D.     1. Pulmonary emboli       MDM  EKG unremarkable, consistent with prior EKG from October 2011. At this time the patient is at high risk for recurrent pulmonary embolism, CT scan of the chest ordered, vital signs unremarkable and he appears to be hemodynamically stable. Lab work ordered including coagulation panel, chemistry to evaluate renal function.   CT shows multifocal PE,  Heparin started -  pt has normal BP and no tachycardia at this time.  Radiologist d/w re: CT, shows possible early infartion of lung - d/w hospitalist who agrees to admit.  O2 sat > 95%  CRITICAL CARE Performed by: Vida Roller   Total critical care time: 35  Critical care time was exclusive of separately billable procedures and treating other patients.  Critical care was necessary to treat or prevent imminent or life-threatening deterioration.  Critical care was time spent personally by me on the following activities: development of treatment plan with patient and/or surrogate as well as nursing, discussions with consultants, evaluation of patient's response to treatment, examination of patient, obtaining history from patient or surrogate, ordering and performing treatments and interventions, ordering and review of  laboratory studies, ordering and review of radiographic studies, pulse oximetry and re-evaluation of patient's condition.   Vida Roller, MD 08/10/11 [phone]

## 2011-08-10 NOTE — H&P (Signed)
[name] is an 36 y.o. male.   Chief Complaint: Chest pain HPI: A 36 year old gentleman with history of pulmonary embolism in May of 2010 and pain who is admitted to the family practice service at the time and treated with Coumadin for 6 months. Patient has not had anymore followups since then. He has felt better until this past week when he started feeling some progressive chest pain. Associated with some shortness of breath. He has had history of tobacco and substance abuse in the past. He still smokes tobacco but no substance abuse. Patient chest pain was 4-6/10 more pressure both right and left sides. No fever no cough no hemoptysis. No nausea vomiting or diarrhea. His initial evaluation in the emergency room showed bilateral pulmonary embolism again. Patient reports possible family history for thromboembolic phenomena. One of his causes recently had DVT. One of his grandmothers also has had a DVT.  Past Medical History  Diagnosis Date  . PE (pulmonary embolism)     History reviewed. No pertinent past surgical history.  History reviewed. No pertinent family history. Social History:  reports that he has been smoking.  He does not have any smokeless tobacco history on file. He reports that he drinks alcohol. He reports that he does not use illicit drugs.  Allergies: No Known Allergies   (Not in a hospital admission)  Results for orders placed during the hospital encounter of 08/10/11 (from the past 48 hour(s))  CBC     Status: Abnormal   Collection Time   08/10/11 12:59 AM      Component Value Range Comment   WBC 12.1 (*) 4.0 - 10.5 (K/uL)    RBC 5.20  4.22 - 5.81 (MIL/uL)    Hemoglobin 15.9  13.0 - 17.0 (g/dL)    HCT 16.1  09.6 - 04.5 (%)    MCV 86.7  78.0 - 100.0 (fL)    MCH 30.6  26.0 - 34.0 (pg)    MCHC 35.3  30.0 - 36.0 (g/dL)    RDW 40.9  81.1 - 91.4 (%)    Platelets 149 (*) 150 - 400 (K/uL)   DIFFERENTIAL     Status: Abnormal   Collection Time   08/10/11 12:59 AM   Component Value Range Comment   Neutrophils Relative 73  43 - 77 (%)    Neutro Abs 8.8 (*) 1.7 - 7.7 (K/uL)    Lymphocytes Relative 15  12 - 46 (%)    Lymphs Abs 1.8  0.7 - 4.0 (K/uL)    Monocytes Relative 11  3 - 12 (%)    Monocytes Absolute 1.3 (*) 0.1 - 1.0 (K/uL)    Eosinophils Relative 2  0 - 5 (%)    Eosinophils Absolute 0.2  0.0 - 0.7 (K/uL)    Basophils Relative 0  0 - 1 (%)    Basophils Absolute 0.0  0.0 - 0.1 (K/uL)   BASIC METABOLIC PANEL     Status: Abnormal   Collection Time   08/10/11 12:59 AM      Component Value Range Comment   Sodium 137  135 - 145 (mEq/L)    Potassium 3.6  3.5 - 5.1 (mEq/L)    Chloride 99  96 - 112 (mEq/L)    CO2 28  19 - 32 (mEq/L)    Glucose, Bld 111 (*) 70 - 99 (mg/dL)    BUN 11  6 - 23 (mg/dL)    Creatinine, Ser 7.82  0.50 - 1.35 (mg/dL)  Calcium 9.3  8.4 - 10.5 (mg/dL)    GFR calc non Af Amer 75 (*) >90 (mL/min)    GFR calc Af Amer 87 (*) >90 (mL/min)   POCT I-STAT TROPONIN I     Status: Normal   Collection Time   08/10/11  1:29 AM      Component Value Range Comment   Troponin i, poc 0.00  0.00 - 0.08 (ng/mL)    Comment 3            APTT     Status: Normal   Collection Time   08/10/11  1:37 AM      Component Value Range Comment   aPTT 29  24 - 37 (seconds)   PROTIME-INR     Status: Normal   Collection Time   08/10/11  1:37 AM      Component Value Range Comment   Prothrombin Time 13.9  11.6 - 15.2 (seconds)    INR 1.05  0.00 - 1.49    POCT I-STAT, CHEM 8     Status: Abnormal   Collection Time   08/10/11  1:43 AM      Component Value Range Comment   Sodium 139  135 - 145 (mEq/L)    Potassium 3.7  3.5 - 5.1 (mEq/L)    Chloride 101  96 - 112 (mEq/L)    BUN 10  6 - 23 (mg/dL)    Creatinine, Ser 1.61 (*) 0.50 - 1.35 (mg/dL)    Glucose, Bld 096 (*) 70 - 99 (mg/dL)    Calcium, Ion 0.45  1.12 - 1.32 (mmol/L)    TCO2 29  0 - 100 (mmol/L)    Hemoglobin 16.7  13.0 - 17.0 (g/dL)    HCT 40.9  81.1 - 91.4 (%)    Dg Chest 2  View  08/10/2011  *RADIOLOGY REPORT*  Clinical Data: Right-sided chest pain  CHEST - 2 VIEW  Comparison: 12/28/2009 CT  Findings: Cardiomediastinal contours are within normal limits. Lungs are clear.  No pleural effusion or pneumothorax.  No acute osseous finding.  IMPRESSION: No radiographic evidence of acute cardiopulmonary process.  Original Report Authenticated By: Waneta Martins, M.D.   Ct Angio Chest W/cm &/or Wo Cm  08/10/2011  *RADIOLOGY REPORT*  Clinical Data: Right-sided chest pain  CT ANGIOGRAPHY CHEST  Technique:  Multidetector CT imaging of the chest using the standard protocol during bolus administration of intravenous contrast. Multiplanar reconstructed images including MIPs were obtained and reviewed to evaluate the vascular anatomy.  Contrast:  Comparison: 12/28/2009  Findings: There are pulmonary emboli to the right lower lobe segments as well as right upper lobe segments.  Normal caliber aorta.  Normal heart size. No CT evidence for right ventricular heart strain.  No pleural or pericardial effusion.  No intrathoracic lymphadenopathy.  Central airways are patent.  There is mild paramediastinal opacity at the right lung base.  This was present on the prior.  Favored to represent scarring.  Minimal opacity within the lingula was also present previously, favored to represent scarring. Minimal opacity within the right lower lobe adjacent to the fissure may reflect scarring or atelectasis.  Otherwise, no new areas of consolidation. No pleural effusion or pneumothorax.  Limited images through the upper abdomen show no acute abnormality.  No acute osseous finding.  IMPRESSION: Right upper and lower lobe branch pulmonary emboli.  No CT evidence for right ventricular heart strain.  Mild right lower lobe and lingular opacities are similar to prior, therefore favored to represent  scarring.  Discussed via telephone with Dr. Hyacinth Meeker at 02:40 a.m. on 08/10/2011.  Original Report Authenticated By: Waneta Martins, M.D.    Review of Systems  Constitutional: Positive for malaise/fatigue.  HENT: Negative.   Eyes: Negative.   Respiratory: Positive for cough and shortness of breath. Negative for hemoptysis, sputum production and wheezing.   Cardiovascular: Positive for chest pain and palpitations. Negative for orthopnea, claudication, leg swelling and PND.  Gastrointestinal: Negative.   Genitourinary: Negative.   Musculoskeletal: Negative.   Skin: Negative.   Neurological: Negative.   Endo/Heme/Allergies: Negative.   Psychiatric/Behavioral: Positive for substance abuse.    Blood pressure 146/96, pulse 75, temperature 98.9 F (37.2 C), temperature source Oral, resp. rate 19, weight 110.678 kg (244 lb), SpO2 98.00%. Physical Exam  Constitutional: He is oriented to person, place, and time. He appears well-developed and well-nourished.  HENT:  Head: Normocephalic and atraumatic.  Right Ear: External ear normal.  Left Ear: External ear normal.  Nose: Nose normal.  Mouth/Throat: Oropharynx is clear and moist.  Eyes: Conjunctivae and EOM are normal. Pupils are equal, round, and reactive to light.  Neck: Normal range of motion. Neck supple.  Cardiovascular: Normal rate, regular rhythm, normal heart sounds and intact distal pulses.   Respiratory: He is in respiratory distress. He has no rales. He exhibits no tenderness.  GI: Soft.  Musculoskeletal: Normal range of motion.  Neurological: He is alert and oriented to person, place, and time. He has normal reflexes.  Skin: Skin is warm and dry.  Psychiatric: He has a normal mood and affect. His behavior is normal. Judgment and thought content normal.     Assessment/Plan This is a 36 year old gentleman presenting with recurrent pulmonary embolism. 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 No identified trigger at this point. No recent surgery or recent long distance travel. 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 Plan #1 pulmonary embolism: Patient will be admitted to a telemetry bed. We'll study on Lovenox and Coumadin breached for now. Oxygen will be given. Patient has no insurance so we may have to treat him in house. He will need a primary care physician. Hopefully she can go back to the family practice Center but he has not been there since 2010. He will not be able to afford Xarelto at this point so we will have to continue with the Coumadin. #2 tobacco use: I will offer him nicotine patch and also tobacco cessation counseling #3 thrombocytopenia: This is transient more than likely. We'll follow his platelet number in the hospital #4 leukocytosis: More than likely from his pulmonary embolism we'll follow his white count closely  Jeanean Hollett,LAWAL 08/10/2011, 3:28 AM

## 2011-08-10 NOTE — ED Notes (Signed)
Transported to CT by [employer]

## 2011-08-10 NOTE — ED Notes (Signed)
EKG done by EMT R Teon Hudnall Old and New EKG given to DR [facility]

## 2011-08-10 NOTE — Progress Notes (Signed)
Turned on educational video on blood thinners for pt.  Told pt to notify with questions after video is over.  Will continue to monitor.

## 2011-08-10 NOTE — ED Notes (Signed)
PT. REPORTS RIGHT LATERAL CHEST PAIN WITH SOB , PAIN WORSE WITH DEEP INSPIRATION AND COUGHING , PT. STATES HISTORY OF PULMONARY EMBOLISM .

## 2011-08-11 ENCOUNTER — Inpatient Hospital Stay (HOSPITAL_COMMUNITY): Payer: Self-pay

## 2011-08-11 DIAGNOSIS — M311 Thrombotic microangiopathy: Secondary | ICD-10-CM

## 2011-08-11 DIAGNOSIS — F17200 Nicotine dependence, unspecified, uncomplicated: Secondary | ICD-10-CM

## 2011-08-11 DIAGNOSIS — R079 Chest pain, unspecified: Secondary | ICD-10-CM

## 2011-08-11 DIAGNOSIS — I2699 Other pulmonary embolism without acute cor pulmonale: Secondary | ICD-10-CM

## 2011-08-11 LAB — CBC
HCT: 41 % (ref 39.0–52.0)
Hemoglobin: 13.9 g/dL (ref 13.0–17.0)
RBC: 4.69 MIL/uL (ref 4.22–5.81)

## 2011-08-11 LAB — PROTIME-INR
INR: 1.07 (ref 0.00–1.49)
Prothrombin Time: 14.1 seconds (ref 11.6–15.2)

## 2011-08-11 MED ORDER — WARFARIN SODIUM 10 MG PO TABS
10.0000 mg | ORAL_TABLET | Freq: Once | ORAL | Status: AC
  Administered 2011-08-11: 10 mg via ORAL
  Filled 2011-08-11: qty 1

## 2011-08-11 NOTE — Progress Notes (Signed)
ANTICOAGULATION CONSULT NOTE - Follow-Up Consult  Pharmacy Consult for Lovenox/Coumadin Indication: pulmonary embolus  No Known Allergies  Patient Measurements: Height: 6' 4.5" (194.3 cm) Weight: 248 lb 14.4 oz (112.9 kg) (B scale) IBW/kg (Calculated) : 87.95    Vital Signs: Temp: 97.7 F (36.5 C) (05/23 0546) Temp src: Oral (05/23 0546) BP: 136/90 mmHg (05/23 0546) Pulse Rate: 63  (05/23 0546)  Labs:  Basename 08/11/11 0628 08/10/11 0530 08/10/11 0143 08/10/11 0137 08/10/11 0059  HGB 13.9 14.3 -- -- --  HCT 41.0 41.9 49.0 -- --  PLT 131* 134* -- -- 149*  APTT -- -- -- 29 --  LABPROT 14.1 13.8 -- 13.9 --  INR 1.07 1.04 -- 1.05 --  HEPARINUNFRC -- -- -- -- --  CREATININE -- 1.30 1.40* -- 1.23  CKTOTAL -- -- -- -- --  CKMB -- -- -- -- --  TROPONINI -- -- -- -- --    Estimated Creatinine Clearance: 111 ml/min (by C-G formula based on Cr of 1.3).  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 INR with no movement past first dose of coumadin last night. CBC remains stable. No bleeding noted.  Goal of Therapy:  Anti-Xa level 0.6-1.2 IU/ml (4 hrs post dose) INR 2-3 Monitor platelets by anticoagulation protocol: Yes   Plan:  1) Contineu Lovenox 110 mg SQ q12h 2) Coumadin 10 mg again tonight. 3) F/U daily PT/INR and CBC q72h while pt on lovenox  Christoper Fabian, PharmD, BCPS Clinical pharmacist, pager [phone] 08/11/2011,11:55 AM

## 2011-08-11 NOTE — Care Management Note (Signed)
    Page 1 of 1   08/11/2011     4:01:07 PM   CARE MANAGEMENT NOTE 08/11/2011  Patient:  [name], [name]   Account Number:  [id number]  Date Initiated:  08/11/2011  Documentation initiated by:  Tera Mater  Subjective/Objective Assessment:   36yo male admitted with Chest Pain, dx with Pulmonary Embolus.  Pt. lives with his mother at home.     Action/Plan:   Spoke with pt. about obtaining a PCP.  Pt. is self pay.   Anticipated DC Date:  08/14/2011   Anticipated DC Plan:  HOME/SELF CARE      DC Planning Services  CM consult  Medication Assistance      Choice offered to / List presented to:             Status of service:  In process, will continue to follow Medicare Important Message given?   (If response is "NO", the following Medicare IM given date fields will be blank) Date Medicare IM given:   Date Additional Medicare IM given:    Discharge Disposition:    Per UR Regulation:  Reviewed for med. necessity/level of care/duration of stay  If discussed at Long Length of Stay Meetings, dates discussed:    Comments:  08/11/11 1130 Spoke with pt. about obtaining a PCP, and he stated he had gone to the clinics with Family Practice in past.  NCM will call to obtain an appointment for pt.  In speaking with pt. about obtaining his medications, pt. stated he would not be able to afford Xarelto.  Gave this information to Dr. Lavera Guise.  Pt. will be rx with Lovenox with a bridge to Coumandin.  Will complete assistance forms for Lovenox and send to [facility] main pharmacy to be filled from Lovenox fund. Tera Mater, RN, BSN NCM [phone]

## 2011-08-11 NOTE — Progress Notes (Signed)
[facility] PROGRESS NOTE  [name] RUE:[id number] DOB: 1975-07-02 DOA: 08/10/2011 PCP: Pcp Not In System  Assessment/Plan: 1. 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 Cannot get xarelto. lovenox/coumadin 2. peluritic chest pain - probably due to lung infarct. Plan for nsaids for pain  3. Tobacco abuse - counseled  Principal Problem:  *PULMONARY EMBOLISM Active Problems:  TOBACCO USER  CHEST PAIN UNSPECIFIED  Thrombocytopenia  James Chestnut, MD  [facility] Pager [phone]  If 7PM-7AM, please contact night-coverage [email or web address] Password [email or web address] 08/11/2011, 8:05 AM   LOS: 1 day   Brief narrative: 36 yo man admitted for PE   HPI/Subjective: Still with chest pain   Objective: Filed Vitals:   08/10/11 1500 08/10/11 2126 08/11/11 0235 08/11/11 0546  BP: 145/78 138/83 135/90 136/90  Pulse: 62 68 75 63  Temp: 98 F (36.7 C) 98.5 F (36.9 C) 98.4 F (36.9 C) 97.7 F (36.5 C)  TempSrc:  Oral Oral Oral  Resp: 20 18 18 18   Height:      Weight:    112.9 kg (248 lb 14.4 oz)  SpO2: 99% 98% 98% 99%    Intake/Output Summary (Last 24 hours) at 08/11/11 0805 Last data filed at 08/11/11 0235  Gross per 24 hour  Intake    983 ml  Output   1400 ml  Net   -417 ml    Exam:   General:  Alert and oriented x3  Cardiovascular: rrr  Respiratory: ctab  Abdomen: soft, nt  Data Reviewed: Basic Metabolic Panel:  Lab 08/10/11 6213 08/10/11 0143 08/10/11 0059  NA 139 139 137  K 3.5 3.7 --  CL 102 101 99  CO2 27 -- 28  GLUCOSE 125* 106* 111*  BUN 10 10 11   CREATININE 1.30 1.40* 1.23  CALCIUM 9.0 -- 9.3  MG 2.1 -- --  PHOS 3.8 -- --   Liver Function Tests:  Lab 08/10/11 0530  AST 15  ALT 25  ALKPHOS 75  BILITOT 0.4  PROT 6.4  ALBUMIN 3.2*   No results found for this basename: LIPASE:5,AMYLASE:5 in the last 168 hours No results found for this basename: AMMONIA:5 in the last 168 hours CBC:  Lab 08/11/11 0628 08/10/11 0530 08/10/11  0143 08/10/11 0059  WBC 8.7 10.8* -- 12.1*  NEUTROABS -- -- -- 8.8*  HGB 13.9 14.3 16.7 15.9  HCT 41.0 41.9 49.0 45.1  MCV 87.4 87.1 -- 86.7  PLT 131* 134* -- 149*   Cardiac Enzymes: No results found for this basename: CKTOTAL:5,CKMB:5,CKMBINDEX:5,TROPONINI:5 in the last 168 hours BNP (last 3 results) No results found for this basename: PROBNP:3 in the last 8760 hours CBG: No results found for this basename: GLUCAP:5 in the last 168 hours  No results found for this or any previous visit (from the past 240 hour(s)).   Studies: Dg Chest 2 View  08/10/2011  *RADIOLOGY REPORT*  Clinical Data: Right-sided chest pain  CHEST - 2 VIEW  Comparison: 12/28/2009 CT  Findings: Cardiomediastinal contours are within normal limits. Lungs are clear.  No pleural effusion or pneumothorax.  No acute osseous finding.  IMPRESSION: No radiographic evidence of acute cardiopulmonary process.  Original Report Authenticated By: Waneta Martins, M.D.   Ct Angio Chest W/cm &/or Wo Cm  08/10/2011  *RADIOLOGY REPORT*  Clinical Data: Right-sided chest pain  CT ANGIOGRAPHY CHEST  Technique:  Multidetector CT imaging of the chest using the standard protocol during bolus administration of intravenous contrast. Multiplanar reconstructed images including MIPs were obtained and reviewed  to evaluate the vascular anatomy.  Contrast:  Comparison: 12/28/2009  Findings: There are pulmonary emboli to the right lower lobe segments as well as right upper lobe segments.  Normal caliber aorta.  Normal heart size. No CT evidence for right ventricular heart strain.  No pleural or pericardial effusion.  No intrathoracic lymphadenopathy.  Central airways are patent.  There is mild paramediastinal opacity at the right lung base.  This was present on the prior.  Favored to represent scarring.  Minimal opacity within the lingula was also present previously, favored to represent scarring. Minimal opacity within the right lower lobe adjacent to  the fissure may reflect scarring or atelectasis.  Otherwise, no new areas of consolidation. No pleural effusion or pneumothorax.  Limited images through the upper abdomen show no acute abnormality.  No acute osseous finding.  IMPRESSION: Right upper and lower lobe branch pulmonary emboli.  No CT evidence for right ventricular heart strain.  Mild right lower lobe and lingular opacities are similar to prior, therefore favored to represent scarring.  Discussed via telephone with Dr. Hyacinth Meeker at 02:40 a.m. on 08/10/2011.  Original Report Authenticated By: Waneta Martins, M.D.    Scheduled Meds:    . sodium chloride   Intravenous STAT  . aspirin EC  81 mg Oral Daily  . coumadin book   Does not apply Once  . docusate sodium  100 mg Oral BID  . enoxaparin (LOVENOX) injection  110 mg Subcutaneous BID  . ibuprofen  400 mg Oral TID  . sodium chloride  3 mL Intravenous Q12H  . warfarin  10 mg Oral ONCE-1800  . warfarin   Does not apply Once  . Warfarin - Pharmacist Dosing Inpatient   Does not apply q1800  . DISCONTD: albuterol  2.5 mg Nebulization Q6H  . DISCONTD: ipratropium  0.5 mg Nebulization Q6H   Continuous Infusions:    . DISCONTD: sodium chloride Stopped (08/10/11 1000)

## 2011-08-12 DIAGNOSIS — R079 Chest pain, unspecified: Secondary | ICD-10-CM

## 2011-08-12 DIAGNOSIS — F17200 Nicotine dependence, unspecified, uncomplicated: Secondary | ICD-10-CM

## 2011-08-12 DIAGNOSIS — M311 Thrombotic microangiopathy: Secondary | ICD-10-CM

## 2011-08-12 DIAGNOSIS — I2699 Other pulmonary embolism without acute cor pulmonale: Secondary | ICD-10-CM

## 2011-08-12 LAB — CBC
Platelets: 143 10*3/uL — ABNORMAL LOW (ref 150–400)
RDW: 13.4 % (ref 11.5–15.5)
WBC: 8.8 10*3/uL (ref 4.0–10.5)

## 2011-08-12 LAB — BASIC METABOLIC PANEL
Chloride: 100 mEq/L (ref 96–112)
GFR calc Af Amer: >90 mL/min (ref >90)
Potassium: 3.9 mEq/L (ref 3.5–5.1)
Sodium: 137 mEq/L (ref 135–145)

## 2011-08-12 LAB — PROTIME-INR: INR: 1.8 — ABNORMAL HIGH (ref 0.00–1.49)

## 2011-08-12 MED ORDER — OXYCODONE HCL 5 MG PO TABS: 5.0000 mg | ORAL_TABLET | ORAL | Status: AC | PRN

## 2011-08-12 MED ORDER — ENOXAPARIN SODIUM 120 MG/0.8ML ~~LOC~~ SOLN: 110.0000 mg | Freq: Two times a day (BID) | SUBCUTANEOUS | Status: DC

## 2011-08-12 MED ORDER — WARFARIN SODIUM 5 MG PO TABS: 5.0000 mg | ORAL_TABLET | Freq: Once | ORAL | Status: DC

## 2011-08-12 MED ORDER — WARFARIN SODIUM 5 MG PO TABS
5.0000 mg | ORAL_TABLET | Freq: Once | ORAL | Status: DC
  Filled 2011-08-12: qty 1

## 2011-08-12 MED ORDER — ENOXAPARIN SODIUM 100 MG/ML ~~LOC~~ SOLN: 110.0000 mg | Freq: Two times a day (BID) | SUBCUTANEOUS | Status: DC

## 2011-08-12 NOTE — Progress Notes (Signed)
ANTICOAGULATION CONSULT NOTE - Follow-Up Consult  Pharmacy Consult for Lovenox/Coumadin Indication: pulmonary embolus  No Known Allergies  Patient Measurements: Height: 6' 4.5" (194.3 cm) Weight: 246 lb 11.1 oz (111.9 kg) (b scale) IBW/kg (Calculated) : 87.95    Vital Signs: Temp: 97.8 F (36.6 C) (05/24 0501) Temp src: Oral (05/24 0501) BP: 141/89 mmHg (05/24 0501) Pulse Rate: 82  (05/24 0501)  Labs:  Basename 08/12/11 0451 08/11/11 0628 08/10/11 0530 08/10/11 0143 08/10/11 0137  HGB 14.7 13.9 -- -- --  HCT 42.9 41.0 41.9 -- --  PLT 143* 131* 134* -- --  APTT -- -- -- -- 29  LABPROT 21.2* 14.1 13.8 -- --  INR 1.80* 1.07 1.04 -- --  HEPARINUNFRC -- -- -- -- --  CREATININE 1.11 -- 1.30 1.40* --  CKTOTAL -- -- -- -- --  CKMB -- -- -- -- --  TROPONINI -- -- -- -- --    Estimated Creatinine Clearance: 129.4 ml/min (by C-G formula based on Cr of 1.11).  Assessment: 36 yo male with recurrent PE on lovenox and coumadin (Day #3/5 minimum overlap). INR with large bump up today. Platelets improved. H/H stable. SCr stable. No bleeding reported.   Goal of Therapy:  Anti-Xa level 0.6-1.2 IU/ml (4 hrs post dose) INR 2-3 Monitor platelets by anticoagulation protocol: Yes   Plan:  1) Contineu Lovenox 110 mg SQ q12h 2) Coumadin 5mg  po x1 tonight- If patient to go home recommend lower dose of 5mg  daily based on bump in INR. Recommend reduced use of NSAIDs 2/2 to bleeding risk.  3) F/U daily PT/INR and CBC q72h while pt on lovenox  Link Snuffer, PharmD, BCPS Clinical Pharmacist [phone] Clinical pharmacist, pager [phone] 08/12/2011,10:04 AM

## 2011-08-12 NOTE — Discharge Summary (Signed)
Physician Discharge Summary  [name]:[id number] DOB: [birth year] DOA: 08/10/2011  PCP: Pcp Not In System  Admit date: 08/10/2011 Discharge date: 08/12/2011  Recommendations for Outpatient Follow-up:  1. PT/INR 2. Establish primary care with [facility]  3. Apply for patient assistance for Xarelto   Discharge Diagnoses:  Principal Problem:  *PULMONARY EMBOLISM Active Problems:  TOBACCO USER  CHEST PAIN UNSPECIFIED  Thrombocytopenia   Discharge Condition: good,   Diet recommendation: regular with coumadin restrictions  History of present illness:  36 year old gentleman with history of pulmonary embolism in May of 2010 and pain who is admitted to the family practice service at the time and treated with Coumadin for 6 months. Patient has not had anymore followups since then. He has felt better until this past week when he started feeling some progressive chest pain. Associated with some shortness of breath. He has had history of tobacco and substance abuse in the past. He still smokes tobacco but no substance abuse. Patient chest pain was 4-6/10 more pressure both right and left sides. No fever no cough no hemoptysis. No nausea vomiting or diarrhea. His initial evaluation in the emergency room showed bilateral pulmonary embolism again. Patient reports possible family history for thromboembolic phenomena. One of his causes recently had DVT. One of his grandmothers also has had a DVT.   Hospital Course:  36 yo man admitted with recurrent pulmonary emboli. He was placed on telemetry and monitored closely. He did not develop any respiratory failure, hemoptysis or pleural efusion. He was anticoagulated with coumadin and lovenox and follow up care was secured in the [facility] clinic. The patient's main symptoms centered around pleuritic chest pain probable due to pulmonary infarct. Repeat CXR was negative for pleural efusion.   Procedures: CTA chest   Discharge  Exam: Filed Vitals:   08/12/11 0501  BP: 141/89  Pulse: 82  Temp: 97.8 F (36.6 C)  Resp: 18   Filed Vitals:   08/11/11 0546 08/11/11 1526 08/11/11 2057 08/12/11 0501  BP: 136/90 143/89 146/78 141/89  Pulse: 63 76 72 82  Temp: 97.7 F (36.5 C) 98.9 F (37.2 C) 98.1 F (36.7 C) 97.8 F (36.6 C)  TempSrc: Oral  Oral Oral  Resp: 18 18 18 18   Height:      Weight: 112.9 kg (248 lb 14.4 oz)   111.9 kg (246 lb 11.1 oz)  SpO2: 99% 100% 97% 96%   General: alert and oriented x3 Respiratory: CTAB CVS: RRR  Discharge Instructions  Discharge Orders    Future Appointments: Provider: Department: Dept Phone: Center:   08/16/2011 10:00 AM [facility] [phone] [facility]     Future Orders Please Complete By Expires   Diet general      Diet general      Increase activity slowly      Increase activity slowly        Medication List  As of 08/13/2011  3:38 PM   TAKE these medications         enoxaparin 120 MG/0.8ML injection   Commonly known as: LOVENOX   Inject 0.73 mLs (110 mg total) into the skin 2 (two) times daily.      ibuprofen 200 MG tablet   Commonly known as: ADVIL,MOTRIN   Take 400 mg by mouth every 6 (six) hours as needed. For pain      oxyCODONE 5 MG immediate release tablet   Commonly known as: Oxy IR/ROXICODONE   Take 1  tablet (5 mg total) by mouth every 4 (four) hours as needed for pain.      warfarin 5 MG tablet   Commonly known as: COUMADIN   Take 1 tablet (5 mg total) by mouth one time only at 6 PM.           Follow-up Information    Follow up with Gar Ponto, PHARMD on 08/16/2011. (at 10 AM)    Contact information:   - [address] [place] [address] [phone]           The results of significant diagnostics from this hospitalization (including imaging, microbiology, ancillary and laboratory) are listed below for reference.    Significant Diagnostic Studies: Dg Chest 2 View  08/11/2011  *RADIOLOGY  REPORT*  Clinical Data: Right chest pain, shortness of breath  CHEST - 2 VIEW  Comparison: CT chest dated 08/10/2011  Findings: Increased interstitial markings.  No frank interstitial edema.  Mild patchy right basilar opacity.  No pleural effusion or pneumothorax.  The heart is normal in size.  Visualized osseous structures are within normal limits.  IMPRESSION: Mild patchy right basilar opacity, possibly reflecting atelectasis or pulmonary infarct in this patient with known pulmonary emboli.  Original Report Authenticated By: Charline Bills, M.D.   Dg Chest 2 View  08/10/2011  *RADIOLOGY REPORT*  Clinical Data: Right-sided chest pain  CHEST - 2 VIEW  Comparison: 12/28/2009 CT  Findings: Cardiomediastinal contours are within normal limits. Lungs are clear.  No pleural effusion or pneumothorax.  No acute osseous finding.  IMPRESSION: No radiographic evidence of acute cardiopulmonary process.  Original Report Authenticated By: Waneta Martins, M.D.   Ct Angio Chest W/cm &/or Wo Cm  08/10/2011  *RADIOLOGY REPORT*  Clinical Data: Right-sided chest pain  CT ANGIOGRAPHY CHEST  Technique:  Multidetector CT imaging of the chest using the standard protocol during bolus administration of intravenous contrast. Multiplanar reconstructed images including MIPs were obtained and reviewed to evaluate the vascular anatomy.  Contrast:  Comparison: 12/28/2009  Findings: There are pulmonary emboli to the right lower lobe segments as well as right upper lobe segments.  Normal caliber aorta.  Normal heart size. No CT evidence for right ventricular heart strain.  No pleural or pericardial effusion.  No intrathoracic lymphadenopathy.  Central airways are patent.  There is mild paramediastinal opacity at the right lung base.  This was present on the prior.  Favored to represent scarring.  Minimal opacity within the lingula was also present previously, favored to represent scarring. Minimal opacity within the right lower lobe  adjacent to the fissure may reflect scarring or atelectasis.  Otherwise, no new areas of consolidation. No pleural effusion or pneumothorax.  Limited images through the upper abdomen show no acute abnormality.  No acute osseous finding.  IMPRESSION: Right upper and lower lobe branch pulmonary emboli.  No CT evidence for right ventricular heart strain.  Mild right lower lobe and lingular opacities are similar to prior, therefore favored to represent scarring.  Discussed via telephone with Dr. Hyacinth Meeker at 02:40 a.m. on 08/10/2011.  Original Report Authenticated By: Waneta Martins, M.D.    Microbiology: No results found for this or any previous visit (from the past 240 hour(s)).   Labs: Basic Metabolic Panel:  Lab 08/12/11 7846 08/10/11 0530 08/10/11 0143 08/10/11 0059  NA 137 139 139 137  K 3.9 3.5 -- --  CL 100 102 101 99  CO2 27 27 -- 28  GLUCOSE 93 125* 106* 111*  BUN 10 10 10 11   CREATININE 1.11 1.30 1.40* 1.23  CALCIUM 9.1 9.0 -- 9.3  MG -- 2.1 -- --  PHOS -- 3.8 -- --   Liver Function Tests:  Lab 08/10/11 0530  AST 15  ALT 25  ALKPHOS 75  BILITOT 0.4  PROT 6.4  ALBUMIN 3.2*   No results found for this basename: LIPASE:5,AMYLASE:5 in the last 168 hours No results found for this basename: AMMONIA:5 in the last 168 hours CBC:  Lab 08/12/11 0451 08/11/11 0628 08/10/11 0530 08/10/11 0143 08/10/11 0059  WBC 8.8 8.7 10.8* -- 12.1*  NEUTROABS -- -- -- -- 8.8*  HGB 14.7 13.9 14.3 16.7 15.9  HCT 42.9 41.0 41.9 49.0 45.1  MCV 87.0 87.4 87.1 -- 86.7  PLT 143* 131* 134* -- 149*   Cardiac Enzymes: No results found for this basename: CKTOTAL:5,CKMB:5,CKMBINDEX:5,TROPONINI:5 in the last 168 hours BNP: BNP (last 3 results) No results found for this basename: PROBNP:3 in the last 8760 hours CBG: No results found for this basename: GLUCAP:5 in the last 168 hours  Time coordinating discharge: 1 hour  Signed:  Lonia Blood, MD  [facility] 08/13/2011, 3:38  PM

## 2011-08-12 NOTE — Progress Notes (Signed)
D/c instructions given to pt regarding f/u care and appointments, home medications and s/s of when to notify MD.  All questions answered and pt verbalized understanding. Pt refused wheelchair assistance.

## 2011-08-16 ENCOUNTER — Ambulatory Visit (INDEPENDENT_AMBULATORY_CARE_PROVIDER_SITE_OTHER): Payer: Self-pay | Admitting: Pharmacist

## 2011-08-16 ENCOUNTER — Ambulatory Visit: Payer: Self-pay | Admitting: Internal Medicine

## 2011-08-16 DIAGNOSIS — Z7901 Long term (current) use of anticoagulants: Secondary | ICD-10-CM | POA: Insufficient documentation

## 2011-08-16 DIAGNOSIS — I2699 Other pulmonary embolism without acute cor pulmonale: Secondary | ICD-10-CM

## 2011-08-16 LAB — POCT INR: INR: 3.3

## 2011-08-16 NOTE — Patient Instructions (Signed)
Patient instructed to take medications as defined in the Anti-coagulation Track section of this encounter.  Patient instructed to take today's dose.  Patient verbalized understanding of these instructions.    

## 2011-08-16 NOTE — Progress Notes (Signed)
Anti-Coagulation Progress Note  [name] is a 36 y.o. male who is currently on an anti-coagulation regimen.    RECENT RESULTS: Recent results are below, the most recent result is correlated with a dose of 35 mg. per week: Lab Results  Component Value Date   INR 3.3 08/16/2011   INR 1.80* 08/12/2011   INR 1.07 08/11/2011    ANTI-COAG DOSE:   Latest dosing instructions   Total Sun Mon Tue Wed Thu Fri Sat   32.5 5 mg 5 mg 5 mg 2.5 mg 5 mg 5 mg 5 mg    (5 mg1) (5 mg1) (5 mg1) (5 mg0.5) (5 mg1) (5 mg1) (5 mg1)         ANTICOAG SUMMARY: Anticoagulation Episode Summary              Current INR goal 2.0-3.0 Next INR check 08/22/2011   INR from last check 3.3! (08/16/2011)     Weekly max dose (mg)  Target end date Indefinite   Indications PULMONARY EMBOLISM, Encounter for long-term (current) use of anticoagulants   [facility] check location [facility]    Send INR reminders to    Comments             ANTICOAG TODAY: Anticoagulation Summary as of 08/16/2011              INR goal 2.0-3.0     Selected INR 3.3! (08/16/2011) Next INR check 08/22/2011   Weekly max dose (mg)  Target end date Indefinite   Indications PULMONARY EMBOLISM, Encounter for long-term (current) use of anticoagulants    Anticoagulation Episode Summary              INR check location [facility] Preferred lab    Send INR reminders to    Comments             PATIENT INSTRUCTIONS: Patient Instructions  Patient instructed to take medications as defined in the Anti-coagulation Track section of this encounter.  Patient instructed to take today's dose.  Patient verbalized understanding of these instructions.        FOLLOW-UP Return in 6 days (on 08/22/2011) for INR at 0900h.  Hulen Luster, III Pharm.D., CACP

## 2011-08-29 ENCOUNTER — Ambulatory Visit (INDEPENDENT_AMBULATORY_CARE_PROVIDER_SITE_OTHER): Payer: Self-pay | Admitting: Pharmacist

## 2011-08-29 DIAGNOSIS — Z7901 Long term (current) use of anticoagulants: Secondary | ICD-10-CM

## 2011-08-29 DIAGNOSIS — I2699 Other pulmonary embolism without acute cor pulmonale: Secondary | ICD-10-CM

## 2011-08-29 LAB — POCT INR: INR: 2.1

## 2011-08-29 MED ORDER — WARFARIN SODIUM 5 MG PO TABS: ORAL_TABLET | ORAL | Status: DC

## 2011-08-29 NOTE — Patient Instructions (Signed)
Patient instructed to take medications as defined in the Anti-coagulation Track section of this encounter.  Patient instructed to take today's dose.  Patient verbalized understanding of these instructions.    

## 2011-08-29 NOTE — Progress Notes (Signed)
Addended by: Hulen Luster B on: 08/29/2011 01:55 PM   Modules accepted: Orders

## 2011-08-29 NOTE — Progress Notes (Signed)
Anti-Coagulation Progress Note  [name] is a 35 y.o. male who is currently on an anti-coagulation regimen.    RECENT RESULTS: Recent results are below, the most recent result is correlated with a dose of 32.5 mg. per week: Lab Results  Component Value Date   INR 2.10 08/29/2011   INR 3.3 08/16/2011   INR 1.80* 08/12/2011    ANTI-COAG DOSE:   Latest dosing instructions   Total Sun Mon Tue Wed Thu Fri Sat   35 5 mg 5 mg 5 mg 5 mg 5 mg 5 mg 5 mg    (5 mg1) (5 mg1) (5 mg1) (5 mg1) (5 mg1) (5 mg1) (5 mg1)         ANTICOAG SUMMARY: Anticoagulation Episode Summary              Current INR goal 2.0-3.0 Next INR check 09/12/2011   INR from last check 2.10 (08/29/2011)     Weekly max dose (mg)  Target end date Indefinite   Indications PULMONARY EMBOLISM, Encounter for long-term (current) use of anticoagulants   INR check location [facility] Preferred lab    Send INR reminders to    Comments             ANTICOAG TODAY: Anticoagulation Summary as of 08/29/2011              INR goal 2.0-3.0     Selected INR 2.10 (08/29/2011) Next INR check 09/12/2011   Weekly max dose (mg)  Target end date Indefinite   Indications PULMONARY EMBOLISM, Encounter for long-term (current) use of anticoagulants    Anticoagulation Episode Summary              INR check location [facility] Preferred lab    Send INR reminders to    Comments             PATIENT INSTRUCTIONS: Patient Instructions  Patient instructed to take medications as defined in the Anti-coagulation Track section of this encounter.  Patient instructed to take today's dose.  Patient verbalized understanding of these instructions.        FOLLOW-UP Return in 2 weeks (on 09/12/2011) for Follow up INR at 1000h.  Hulen Luster, III Pharm.D., CACP

## 2011-09-06 NOTE — Progress Notes (Signed)
It appears that Dr. Alexandria Lodge has scheduled a follow-up appointment to check Mr. Roudebush' INR on 09/12/2011.

## 2011-09-12 ENCOUNTER — Telehealth: Payer: Self-pay | Admitting: Pharmacist

## 2011-09-12 ENCOUNTER — Ambulatory Visit (INDEPENDENT_AMBULATORY_CARE_PROVIDER_SITE_OTHER): Payer: Self-pay | Admitting: Pharmacist

## 2011-09-12 DIAGNOSIS — Z7901 Long term (current) use of anticoagulants: Secondary | ICD-10-CM

## 2011-09-12 DIAGNOSIS — I2699 Other pulmonary embolism without acute cor pulmonale: Secondary | ICD-10-CM

## 2011-09-12 LAB — POCT INR: INR: 2.4

## 2011-09-12 MED ORDER — WARFARIN SODIUM 5 MG PO TABS: ORAL_TABLET | ORAL | Status: DC

## 2011-09-12 NOTE — Patient Instructions (Signed)
Patient instructed to take medications as defined in the Anti-coagulation Track section of this encounter.  Patient instructed to take today's dose.  Patient verbalized understanding of these instructions.    

## 2011-09-12 NOTE — Progress Notes (Signed)
Anti-Coagulation Progress Note  [name] is a 36 y.o. male who is currently on an anti-coagulation regimen.    RECENT RESULTS: Recent results are below, the most recent result is correlated with a dose of 35 mg. per week: Lab Results  Component Value Date   INR 2.40 09/12/2011   INR 2.10 08/29/2011   INR 3.3 08/16/2011    ANTI-COAG DOSE:   Latest dosing instructions   Total Sun Mon Tue Wed Thu Fri Sat   35 5 mg 5 mg 5 mg 5 mg 5 mg 5 mg 5 mg    (5 mg1) (5 mg1) (5 mg1) (5 mg1) (5 mg1) (5 mg1) (5 mg1)         ANTICOAG SUMMARY: Anticoagulation Episode Summary              Current INR goal 2.0-3.0 Next INR check 10/03/2011   INR from last check 2.40 (09/12/2011)     Weekly max dose (mg)  Target end date Indefinite   Indications PULMONARY EMBOLISM, Encounter for long-term (current) use of anticoagulants   INR check location [facility] Preferred lab    Send INR reminders to    Comments             ANTICOAG TODAY: Anticoagulation Summary as of 09/12/2011              INR goal 2.0-3.0     Selected INR 2.40 (09/12/2011) Next INR check 10/03/2011   Weekly max dose (mg)  Target end date Indefinite   Indications PULMONARY EMBOLISM, Encounter for long-term (current) use of anticoagulants    Anticoagulation Episode Summary              INR check location [facility] Preferred lab    Send INR reminders to    Comments             PATIENT INSTRUCTIONS: Patient Instructions  Patient instructed to take medications as defined in the Anti-coagulation Track section of this encounter.  Patient instructed to take today's dose.  Patient verbalized understanding of these instructions.        FOLLOW-UP Return in 3 weeks (on 10/03/2011) for Follow up INR at 1030h.  Hulen Luster, III Pharm.D., CACP

## 2011-09-26 ENCOUNTER — Ambulatory Visit: Payer: Self-pay

## 2011-10-03 ENCOUNTER — Ambulatory Visit (INDEPENDENT_AMBULATORY_CARE_PROVIDER_SITE_OTHER): Payer: Self-pay | Admitting: Pharmacist

## 2011-10-03 DIAGNOSIS — Z7901 Long term (current) use of anticoagulants: Secondary | ICD-10-CM

## 2011-10-03 DIAGNOSIS — I2699 Other pulmonary embolism without acute cor pulmonale: Secondary | ICD-10-CM

## 2011-10-03 NOTE — Patient Instructions (Signed)
Patient instructed to take medications as defined in the Anti-coagulation Track section of this encounter.  Patient instructed to take today's dose.  Patient verbalized understanding of these instructions.    

## 2011-10-03 NOTE — Progress Notes (Signed)
Anti-Coagulation Progress Note  [name] is a 36 y.o. male who is currently on an anti-coagulation regimen.    RECENT RESULTS: Recent results are below, the most recent result is correlated with a dose of 35 mg. per week: Lab Results  Component Value Date   INR 2.50 10/03/2011   INR 2.40 09/12/2011   INR 2.10 08/29/2011    ANTI-COAG DOSE:   Latest dosing instructions   Total Sun Mon Tue Wed Thu Fri Sat   35 5 mg 5 mg 5 mg 5 mg 5 mg 5 mg 5 mg    (5 mg1) (5 mg1) (5 mg1) (5 mg1) (5 mg1) (5 mg1) (5 mg1)         ANTICOAG SUMMARY: Anticoagulation Episode Summary              Current INR goal 2.0-3.0 Next INR check 10/24/2011   INR from last check 2.50 (10/03/2011)     Weekly max dose (mg)  Target end date Indefinite   Indications PULMONARY EMBOLISM, Encounter for long-term (current) use of anticoagulants   INR check location [facility] Preferred [facility]    Send INR reminders to    Comments             ANTICOAG TODAY: Anticoagulation Summary as of 10/03/2011              INR goal 2.0-3.0     Selected INR 2.50 (10/03/2011) Next INR check 10/24/2011   Weekly max dose (mg)  Target end date Indefinite   Indications PULMONARY EMBOLISM, Encounter for long-term (current) use of anticoagulants    Anticoagulation Episode Summary              INR check location [facility] Preferred lab    Send INR reminders to    Comments             PATIENT INSTRUCTIONS: Patient Instructions  Patient instructed to take medications as defined in the Anti-coagulation Track section of this encounter.  Patient instructed to take today's dose.  Patient verbalized understanding of these instructions.        FOLLOW-UP Return in 3 weeks (on 10/24/2011) for Follow up INR at 1030.  Hulen Luster, III Pharm.D., CACP

## 2011-10-04 NOTE — Progress Notes (Signed)
This patient was referred from the hospitalist service to the [facility] for anti-coagulation management and for primary medical care.  He has apparently not been seen by a physician in our clinic, and an appointment needs to be arranged for an office visit with a physician so he can establish continuity primary care.

## 2011-10-06 NOTE — Progress Notes (Signed)
Pt is not our pt, was not scheduled to become our pt. I am not certain how he was able to be sch into Dr Saralyn Pilar anticoag clinic but have asked Rosanne Ashing to investigate.

## 2011-10-24 ENCOUNTER — Encounter: Payer: Self-pay | Admitting: Internal Medicine

## 2011-10-24 ENCOUNTER — Ambulatory Visit (INDEPENDENT_AMBULATORY_CARE_PROVIDER_SITE_OTHER): Payer: Self-pay | Admitting: Pharmacist

## 2011-10-24 ENCOUNTER — Ambulatory Visit: Payer: Self-pay | Admitting: Internal Medicine

## 2011-10-24 DIAGNOSIS — I2699 Other pulmonary embolism without acute cor pulmonale: Secondary | ICD-10-CM

## 2011-10-24 DIAGNOSIS — Z7901 Long term (current) use of anticoagulants: Secondary | ICD-10-CM

## 2011-10-24 LAB — POCT INR: INR: 2.1

## 2011-10-24 NOTE — Patient Instructions (Signed)
Patient instructed to take medications as defined in the Anti-coagulation Track section of this encounter.  Patient instructed to take today's dose.  Patient verbalized understanding of these instructions.    

## 2011-10-24 NOTE — Progress Notes (Signed)
Anti-Coagulation Progress Note  [name] is a 36 y.o. male who is currently on an anti-coagulation regimen.    RECENT RESULTS: Recent results are below, the most recent result is correlated with a dose of 35 mg. per week: Lab Results  Component Value Date   INR 2.10 10/24/2011   INR 2.50 10/03/2011   INR 2.40 09/12/2011    ANTI-COAG DOSE:   Latest dosing instructions   Total Sun Mon Tue Wed Thu Fri Sat   40 5 mg 7.5 mg 5 mg 5 mg 7.5 mg 5 mg 5 mg    (5 mg1) (5 mg1.5) (5 mg1) (5 mg1) (5 mg1.5) (5 mg1) (5 mg1)         ANTICOAG SUMMARY: Anticoagulation Episode Summary              Current INR goal 2.0-3.0 Next INR check 11/14/2011   INR from last check 2.10 (10/24/2011)     Weekly max dose (mg)  Target end date Indefinite   Indications PULMONARY EMBOLISM, Encounter for long-term (current) use of anticoagulants   INR check location [facility] Preferred lab    Send INR reminders to    Comments             ANTICOAG TODAY: Anticoagulation Summary as of 10/24/2011              INR goal 2.0-3.0     Selected INR 2.10 (10/24/2011) Next INR check 11/14/2011   Weekly max dose (mg)  Target end date Indefinite   Indications PULMONARY EMBOLISM, Encounter for long-term (current) use of anticoagulants    Anticoagulation Episode Summary              INR check location [facility] Preferred lab    Send INR reminders to    Comments             PATIENT INSTRUCTIONS: Patient Instructions  Patient instructed to take medications as defined in the Anti-coagulation Track section of this encounter.  Patient instructed to take today's dose.  Patient verbalized understanding of these instructions.        FOLLOW-UP Return in 3 weeks (on 11/14/2011) for Follow up INR at 1115h.  Hulen Luster, III Pharm.D., CACP

## 2011-11-14 ENCOUNTER — Ambulatory Visit: Payer: Self-pay | Admitting: Internal Medicine

## 2011-11-14 ENCOUNTER — Encounter: Payer: Self-pay | Admitting: Internal Medicine

## 2011-11-14 ENCOUNTER — Ambulatory Visit: Payer: Self-pay

## 2011-12-30 ENCOUNTER — Encounter: Payer: Self-pay | Admitting: Internal Medicine

## 2012-01-16 ENCOUNTER — Inpatient Hospital Stay (HOSPITAL_COMMUNITY)
Admission: EM | Admit: 2012-01-16 | Discharge: 2012-01-17 | DRG: 176 | Disposition: A | Discharge status: Home / Self Care | Payer: MEDICAID | Attending: Internal Medicine | Admitting: Internal Medicine

## 2012-01-16 ENCOUNTER — Encounter (HOSPITAL_COMMUNITY): Payer: Self-pay | Admitting: Emergency Medicine

## 2012-01-16 ENCOUNTER — Telehealth: Payer: Self-pay | Admitting: *Deleted

## 2012-01-16 DIAGNOSIS — I82409 Acute embolism and thrombosis of unspecified deep veins of unspecified lower extremity: Secondary | ICD-10-CM | POA: Diagnosis present

## 2012-01-16 DIAGNOSIS — F1410 Cocaine abuse, uncomplicated: Secondary | ICD-10-CM

## 2012-01-16 DIAGNOSIS — N181 Chronic kidney disease, stage 1: Secondary | ICD-10-CM | POA: Diagnosis present

## 2012-01-16 DIAGNOSIS — Z7901 Long term (current) use of anticoagulants: Secondary | ICD-10-CM

## 2012-01-16 DIAGNOSIS — Z9181 History of falling: Secondary | ICD-10-CM

## 2012-01-16 DIAGNOSIS — M7989 Other specified soft tissue disorders: Secondary | ICD-10-CM

## 2012-01-16 DIAGNOSIS — N179 Acute kidney failure, unspecified: Secondary | ICD-10-CM | POA: Diagnosis not present

## 2012-01-16 DIAGNOSIS — I824Y9 Acute embolism and thrombosis of unspecified deep veins of unspecified proximal lower extremity: Secondary | ICD-10-CM | POA: Diagnosis present

## 2012-01-16 DIAGNOSIS — I824Z9 Acute embolism and thrombosis of unspecified deep veins of unspecified distal lower extremity: Secondary | ICD-10-CM | POA: Diagnosis present

## 2012-01-16 DIAGNOSIS — Z86711 Personal history of pulmonary embolism: Secondary | ICD-10-CM

## 2012-01-16 DIAGNOSIS — R55 Syncope and collapse: Secondary | ICD-10-CM | POA: Diagnosis present

## 2012-01-16 DIAGNOSIS — I2699 Other pulmonary embolism without acute cor pulmonale: Principal | ICD-10-CM

## 2012-01-16 DIAGNOSIS — F17200 Nicotine dependence, unspecified, uncomplicated: Secondary | ICD-10-CM | POA: Diagnosis present

## 2012-01-16 HISTORY — DX: Syncope and collapse: R55

## 2012-01-16 HISTORY — DX: Acute embolism and thrombosis of unspecified deep veins of unspecified lower extremity: I82.409

## 2012-01-16 LAB — COMPREHENSIVE METABOLIC PANEL
AST: 13 U/L (ref 0–37)
CO2: 30 mEq/L (ref 19–32)
Chloride: 104 mEq/L (ref 96–112)
Creatinine, Ser: 1.34 mg/dL (ref 0.50–1.35)
GFR calc non Af Amer: 68 mL/min — ABNORMAL LOW (ref >90)
Total Bilirubin: 0.7 mg/dL (ref 0.3–1.2)

## 2012-01-16 LAB — CBC WITH DIFFERENTIAL/PLATELET
Basophils Absolute: 0 10*3/uL (ref 0.0–0.1)
HCT: 40.6 % (ref 39.0–52.0)
Hemoglobin: 13.9 g/dL (ref 13.0–17.0)
Lymphocytes Relative: 15 % (ref 12–46)
Lymphs Abs: 1.8 10*3/uL (ref 0.7–4.0)
Monocytes Absolute: 1.1 10*3/uL — ABNORMAL HIGH (ref 0.1–1.0)
Monocytes Relative: 10 % (ref 3–12)
Neutro Abs: 8.5 10*3/uL — ABNORMAL HIGH (ref 1.7–7.7)
RBC: 4.75 MIL/uL (ref 4.22–5.81)
WBC: 11.8 10*3/uL — ABNORMAL HIGH (ref 4.0–10.5)

## 2012-01-16 LAB — GLUCOSE, CAPILLARY: Glucose-Capillary: 108 mg/dL — ABNORMAL HIGH (ref 70–99)

## 2012-01-16 MED ORDER — ENOXAPARIN SODIUM 120 MG/0.8ML ~~LOC~~ SOLN
1.0000 mg/kg | Freq: Two times a day (BID) | SUBCUTANEOUS | Status: DC
Start: 2012-01-16 — End: 2012-01-16

## 2012-01-16 MED ORDER — HEPARIN (PORCINE) IN NACL 100-0.45 UNIT/ML-% IJ SOLN
1750.0000 [IU]/h | INTRAMUSCULAR | Status: DC
  Administered 2012-01-16: 1750 [IU]/h via INTRAVENOUS
  Filled 2012-01-16 (×2): qty 250

## 2012-01-16 MED ORDER — ACETAMINOPHEN 325 MG PO TABS
650.0000 mg | ORAL_TABLET | Freq: Four times a day (QID) | ORAL | Status: DC | PRN
  Administered 2012-01-16: 650 mg via ORAL
  Filled 2012-01-16: qty 2

## 2012-01-16 MED ORDER — MORPHINE SULFATE 2 MG/ML IJ SOLN: 2.0000 mg | INTRAMUSCULAR | Status: DC | PRN

## 2012-01-16 MED ORDER — HYDROMORPHONE HCL PF 1 MG/ML IJ SOLN: 1.0000 mg | INTRAMUSCULAR | Status: DC | PRN

## 2012-01-16 MED ORDER — ACETAMINOPHEN 650 MG RE SUPP: 650.0000 mg | Freq: Four times a day (QID) | RECTAL | Status: DC | PRN

## 2012-01-16 MED ORDER — WARFARIN - PHARMACIST DOSING INPATIENT: Freq: Every day | Status: DC

## 2012-01-16 MED ORDER — SODIUM CHLORIDE 0.9 % IV SOLN
INTRAVENOUS | Status: DC
  Administered 2012-01-16: 18:00:00 via INTRAVENOUS

## 2012-01-16 MED ORDER — HEPARIN BOLUS VIA INFUSION
4000.0000 [IU] | Freq: Once | INTRAVENOUS | Status: AC
  Administered 2012-01-16: 4000 [IU] via INTRAVENOUS

## 2012-01-16 MED ORDER — ENOXAPARIN SODIUM 120 MG/0.8ML ~~LOC~~ SOLN
110.0000 mg | Freq: Two times a day (BID) | SUBCUTANEOUS | Status: DC
  Administered 2012-01-16 – 2012-01-17 (×3): 110 mg via SUBCUTANEOUS
  Filled 2012-01-16 (×4): qty 0.8

## 2012-01-16 MED ORDER — HEPARIN (PORCINE) IN NACL 100-0.45 UNIT/ML-% IJ SOLN: 10.0000 [IU]/kg/h | INTRAMUSCULAR | Status: DC

## 2012-01-16 MED ORDER — WARFARIN VIDEO: Freq: Once | Status: DC

## 2012-01-16 MED ORDER — ALUM & MAG HYDROXIDE-SIMETH 200-200-20 MG/5ML PO SUSP: 30.0000 mL | Freq: Four times a day (QID) | ORAL | Status: DC | PRN

## 2012-01-16 MED ORDER — ONDANSETRON HCL 4 MG/2ML IJ SOLN: 4.0000 mg | Freq: Three times a day (TID) | INTRAMUSCULAR | Status: AC | PRN

## 2012-01-16 MED ORDER — COUMADIN BOOK
Freq: Once | Status: AC
  Administered 2012-01-16: 18:00:00
  Filled 2012-01-16: qty 1

## 2012-01-16 MED ORDER — WARFARIN SODIUM 10 MG PO TABS
10.0000 mg | ORAL_TABLET | Freq: Once | ORAL | Status: AC
  Administered 2012-01-16: 10 mg via ORAL
  Filled 2012-01-16: qty 1

## 2012-01-16 NOTE — Telephone Encounter (Signed)
Phone call placed to patient to make appointment with PCP. Patient states he/she has decided to see another physician and will not be returning to the [facility] for medical care. Swetha Rayle,01/16/12;3:55 PM

## 2012-01-16 NOTE — ED Notes (Signed)
Vascular lab calling for patient, will be coming to get patient for study.

## 2012-01-16 NOTE — Progress Notes (Addendum)
ANTICOAGULATION CONSULT NOTE - Initial Consult  Pharmacy Consult for Lovenox/Coumadin Indication: DVT  No Known Allergies  Patient Measurements: Height: 6\' 4"  (193 cm) Weight: 236 lb 3.2 oz (107.14 kg) IBW/kg (Calculated) : 86.8  Heparin Dosing Weight: 107 kg  Vital Signs: Temp: 97.8 F (36.6 C) (10/28 1607) Temp src: Oral (10/28 1607) BP: 119/81 mmHg (10/28 1607) Pulse Rate: 81  (10/28 1607)  Labs:  [facility] 01/16/12 0828  HGB 13.9  HCT 40.6  PLT 143*  APTT --  LABPROT --  INR --  HEPARINUNFRC --  CREATININE 1.34  CKTOTAL --  CKMB --  TROPONINI --    Estimated Creatinine Clearance: 104.3 ml/min (by C-G formula based on Cr of 1.34).   Medical History: Past Medical History  Diagnosis Date  . PE (pulmonary embolism)     Assessment:: 36 y.o. M with hx of a prior PE in '11/'13 who presented to the [facility] with LOC and increasing swelling and pain in his RLE for 1 week. Dopplers were positive for a RLE DVT and pharmacy was consulted to start heparin for anticoagulation.  The patient was noted to be noncompliant with his warfarin PTA and had been off of this for ~1 month. No baseline INR taken on admission. Discussed any concern for CVA with Dr. Judd Cowan given LOC on presentation with prior clotting history and he stated no concern for this. No head CT done on admit.  Goal of Therapy:  Anti-Xa level 0.6-1.2 units/ml 4hrs after LMWH dose given Monitor platelets by anticoagulation protocol: Yes INR 2-3   Plan:  Baseline PT/INR and daily. D/c heparin 1 hrs after Lovenox given Lovenox 110mg  sq q12hrs. Coumadin 10mg  po x 1 tonight CBC every 72hrs while on Lovenox  [name], [name] 01/16/2012,5:27 PM

## 2012-01-16 NOTE — ED Notes (Signed)
Per EMS: syncopal episode this am fell in shower, c/o rt leg pain, 122/88 CBG 88. A&O x4 when EMS arrived. Denies sob, n/v, pain. 18GPIV Rt A/C. Not compliant with coumadin for 1 month. Slight ST elevation on EKG in V2/V3. Pt currently VSS, nad.

## 2012-01-16 NOTE — H&P (Signed)
Hospital Admission Note Date: 01/16/2012  Patient name: [name] Medical record number: [id number] Date of birth: 1975-11-18 Age: 36 y.o. Gender: male PCP: No primary provider on file.  Medical Service: Internal medicine teaching service.  Attending physician: Dr Doneen Poisson   1st Contact: Dr Dow Adolph  Pager: [phone]  2nd Contact: Dr Elberta Fortis  Pager: [phone]   After 5 pm or weekends:  1st Contact: Pager: [phone]  2nd Contact: Pager: [phone]  Chief Complaint: Syncope  History of Present Illness: [name] is a 36 years old man with past medical history significant of two previous PE who presents with history of syncope that happened today in the morning. He woke up and went to have a shower and while he was getting out, he felt dizzy and had a fall. He endorses loss of consciousness. He estimates that he spent about 10 minutes on the floor, but he does not think that he hit his head. When he got  up did not have any pains. No previous history of similar falls. He denies history no visual changes or seizures. He denies history of chest pain, palpitations, or shortness of breath, associated with a fall. No history of weakness in his extremities and he denies history of dysarthria.  He was on warfarin until 4 weeks ago, when he ran out of it and he did not refill it because he did not have money. He had been on warfarin for 6 months. Prior to that, the patient had a history of a PE, which also happened after he had been off Coumadin for 18 months. His first PE was in 2010. He denies any history of a stroke in the past.   Of note, he reports a history of pain in the right calf muscle for the last one week. He describes the pain as achy, constant and 5/10. He reports that he feels like "muscle torn in my calf'. He also reports that he feels the right lower extremities is swollen. No history of trauma to the right extremity preceding the pain. He denies any history of long  distance travel recently. The patient reports that he is active at home.  He has never been evaluated for a blood clotting disorder. Family a history, is only remarkable for a cousin, who has a blood clot. His parents or the 2 siblings do not have history of blood clots.  He endorses history of smoking about one pack a day of cigarettes. He does not take any other medications.   Meds: No current outpatient prescriptions on file.  Allergies: Allergies as of 01/16/2012  . (No Known Allergies)   Past Medical History  Diagnosis Date  . PE (pulmonary embolism)    Past Surgical History  Procedure Date  . No past surgeries   . Mouth surgery    History reviewed. No pertinent family history. History   Social History  . Marital Status: Single    Spouse Name: N/A    Number of Children: N/A  . Years of Education: N/A   Occupational History  . Not on file.   Social History Main Topics  . Smoking status: Current Every Day Smoker -- 1.0 packs/day for 5 years    Types: Cigarettes  . Smokeless tobacco: Never Used  . Alcohol Use: Yes     occassional  . Drug Use: Yes    Special: Cocaine     past history  . Sexually Active: Not Currently   Other Topics Concern  .  Not on file   Social History Narrative  . No narrative on file    Review of Systems: Constitutional: negative for anorexia, chills, fatigue, night sweats, sweats and weight loss Respiratory: negative for asthma, cough, hemoptysis, pleurisy/chest pain and wheezing Cardiovascular: negative for chest pressure/discomfort, claudication, dyspnea, irregular heart beat, orthopnea and palpitations Gastrointestinal: negative Genitourinary:negative Musculoskeletal:negative for back pain, bone pain, muscle weakness, myalgias and neck pain Neurological: as noted in HPI Endocrine: negative Allergic/Immunologic: negative  Physical Exam: Blood pressure 119/81, pulse 81, temperature 97.8 F (36.6 C), temperature source Oral,  resp. rate 19, height 6\' 4"  (1.93 m), weight 236 lb 3.2 oz (107.14 kg), SpO2 98.00%. General appearance: alert, cooperative, appears stated age, no distress and seated in bed and having lunch Head: Normocephalic, without obvious abnormality, atraumatic Eyes: conjunctivae/corneas clear. PERRL, EOM's intact. Fundi benign. Neck: no adenopathy, no carotid bruit, no JVD, supple, symmetrical, trachea midline and thyroid not enlarged, symmetric, no tenderness/mass/nodules Back: symmetric, no curvature. ROM normal. No CVA tenderness. Lungs: clear to auscultation bilaterally Chest wall: no tenderness Heart: regular rate and rhythm, S1, S2 normal, no murmur, click, rub or gallop Abdomen: soft, non-tender; bowel sounds normal; no masses,  no organomegaly Extremities: Right lower extremity marked for slight warmth without any areas of erythema or tenderness. Visually it does not appear to be larger than the left. Pulses: 2+ and symmetric Skin: Skin color, texture, turgor normal. No rashes or lesions Neurologic: Alert and oriented X 3, normal strength and tone. Normal symmetric reflexes. Normal coordination and gait  Lab results: Basic Metabolic Panel:  [facility] 01/16/12 0828  NA 141  K 3.5  CL 104  CO2 30  GLUCOSE 113*  BUN 11  CREATININE 1.34  CALCIUM 9.3  MG --  PHOS --   Liver Function Tests:  [facility] 01/16/12 0828  AST 13  ALT 17  ALKPHOS 61  BILITOT 0.7  PROT 6.7  ALBUMIN 3.6     Basename 01/16/12 0828  WBC 11.8*  NEUTROABS 8.5*  HGB 13.9  HCT 40.6  MCV 85.5  PLT 143*   D-Dimer:  Basename 01/16/12 0828  DDIMER 1.72*   CBG:  Basename 01/16/12 0752  GLUCAP 108*    Drugs of Abuse     Component Value Date/Time   LABOPIA NONE DETECTED 10/27/2009 0217   COCAINSCRNUR POSITIVE* 10/27/2009 0217   LABBENZ NONE DETECTED 10/27/2009 0217   AMPHETMU NONE DETECTED 10/27/2009 0217   THCU NONE DETECTED 10/27/2009 0217   LABBARB  Value: NONE DETECTED        DRUG SCREEN FOR MEDICAL  PURPOSES ONLY.  IF CONFIRMATION IS NEEDED FOR ANY PURPOSE, NOTIFY LAB WITHIN 5 DAYS.        LOWEST DETECTABLE LIMITS FOR URINE DRUG SCREEN Drug Class       Cutoff (ng/mL) Amphetamine      1000 Barbiturate      200 Benzodiazepine   200 Tricyclics       300 Opiates          300 Cocaine          300 THC              50 10/27/2009 0217      Imaging results:  No results found.  Other results: EKG: normal EKG, normal sinus rhythm, without signs of right heart strain.  Assessment & Plan by Problem: This is a 36 year old man, who presents with right calf pain, and a history of a syncope at home. He has a Past medical  history for recurrent PEs and has been off Coumadin.  Syncope: James. Eckart reports a history of a fall with loss of consciousness. However, he does not report any history of palpitations, he also denies history of convulsions, or weakness in his extremities following this fall. He remembers feeling dizzy shortly before he fell. Differential diagnoses considered in this patient include acute pulmonary embolism in this patient with recurrent pulmonary PEs, who has been off Coumadin for one month and lower extremity Dopplers showing a clot in his right lower extremity. It is unlikely that this syncope was from a cardiac source. Other possible causes of this syncope, including a stroke are unlikely given the negative neurological exam in this patient. Plan -Fall precautions -Monitor vital signs regular -A head CT scan, is not indicated in this patient without any focal neurological signs.  Pulmonary embolism: Patient's presentation is very suggestive of a pulmonary embolism. Patient first had a PE in 2010, and a second one in 2012. In the ED, had Doppler of his right lower extremity, which demonstrated a deep venous thrombosis. His vital signs on admission with stable, with a heart rate of 86 beats per minute and oxygen saturation of 98% on room air. It is likely that this patient's described  syncopal episodes related to an acute pulmonary embolism. Clinically he had compensated well. EKG does not show any right heart strain. However, given this patient's likely large clot burden he should be admitted for observation. A chest CT angiogram was not performed given that it will not change our course of management in this patient was a history is very clear.  Plan -Admit telemetry - Will start this patient on Lovenox and Coumadin. INR is 1.07. -Monitor vital signs -If the patient decompensates over the night with hypotension, or increased heart rate, thrombolytic therapy should be considered. -Once the INR is therapeutic, patient should be on long-term Coumadin therapy given his history of recurrent PEs.  Smoking: Patient endorses a history of smoking about half a pack a day. This puts him at an increased risk for pulmonary embolism. Plan. -Cigarette smoking cessation counseling will be provided while he is in the hospital..     Signed: Dow Adolph 01/16/2012, 5:55 PM

## 2012-01-16 NOTE — ED Notes (Signed)
CBG completed 108

## 2012-01-16 NOTE — H&P (Signed)
Hospital Admission Note Date: 01/16/2012  Patient name: [name] Medical record number: [id number] Date of birth: 1975-07-12 Age: 36 y.o. Gender: male PCP: No primary provider on file.  Medical Service: Internal Medcine  Attending physician: Dr. 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  He was in usual state of his health until a week ago when he started having some pain and swelling in his right lower extremity. He denies any redness associated with it and states that his legs usually get swollen  in the evening. On the morning of his admission he had an episode of syncope after he finished his shower. He states that he fell dizzy and short of breath and gradually fell on the floor. He could not exactly know the duration of his loss of consciousness but states that he found himself lying on the floor and dog licking him up when he woke up. He continued to feel short of breath and lightheaded and decided to come to the ER. Of note patient was supposed to be on Coumadin for his second episode of PE since May of 2013 but he has been off his Coumadin for last 3 weeks due to transportation issues.  Denies any palpitations, chest pain, slurred speech, weakness or numbness.  Meds: No current outpatient prescriptions on file.  Allergies: Allergies as of 01/16/2012  . (No Known Allergies)   Past Medical History  Diagnosis Date  . PE (pulmonary embolism)    Past Surgical History  Procedure Date  . No past surgeries   . Mouth surgery    History reviewed. No pertinent family history. History    Social History  . Marital Status: Single    Spouse Name: N/A    Number of Children: N/A  . Years of Education: N/A   Occupational History  . Not on file.   Social History Main Topics  . Smoking status: Current Every Day Smoker -- 1.0 packs/day for 5 years    Types: Cigarettes  . Smokeless tobacco: Never Used  . Alcohol Use: Yes     occassional  . Drug Use: Yes    Special: Cocaine     past history  . Sexually Active: Not Currently   Other Topics Concern  . Not on file   Social History Narrative  . No narrative on file    Review of Systems: A comprehensive review of systems was negative.  Physical Exam: Blood pressure 119/81, pulse 81, temperature 97.8 F (36.6 C), temperature source Oral, resp. rate 19, height 6\' 4"  (1.93 m), weight 236 lb 3.2 oz (107.14 kg), SpO2 98.00%. BP 119/81  Pulse 81  Temp 97.8 F (36.6 C) (Oral)  Resp 19  Ht 6\' 4"  (1.93 m)  Wt 236 lb 3.2 oz (107.14 kg)  BMI 28.75 kg/m2  SpO2 98%  General Appearance:    Alert, cooperative, no distress, appears stated age  Head:    Normocephalic, without obvious abnormality, atraumatic  Eyes:    PERRL, conjunctiva/corneas clear, EOM's intact, fundi    benign, both eyes  Ears:    Normal TM's and external ear canals, both ears  Nose:   Nares normal, septum midline, mucosa normal, no drainage    or sinus tenderness  Throat:   Lips, mucosa, and tongue normal; teeth and gums normal  Neck:   Supple, symmetrical, trachea midline, no adenopathy;       thyroid:  No enlargement/tenderness/nodules; no carotid   bruit or JVD  Back:     Symmetric, no curvature, ROM normal, no CVA tenderness  Lungs:     Clear to auscultation bilaterally, respirations unlabored  Chest wall:    No tenderness or deformity  Heart:    Regular rate and rhythm, S1 and S2 normal, no murmur, rub   or gallop  Abdomen:     Soft, non-tender, bowel sounds active all four quadrants,    no masses, no organomegaly  Genitalia:    Normal  male without lesion, discharge or tenderness  Rectal:    Normal tone, normal prostate, no masses or tenderness;   guaiac negative stool  Extremities:   Right lower extremity slightly more warm to touch from left LE, mild redness, no swelling   Pulses:   2+ and symmetric all extremities  Skin:   Skin color, texture, turgor normal, no rashes or lesions  Lymph nodes:   Cervical, supraclavicular, and axillary nodes normal  Neurologic:   CNII-XII intact. Normal strength, sensation and reflexes      throughout    Lab results: Basic Metabolic Panel:  [facility] 01/16/12 0828  NA 141  K 3.5  CL 104  CO2 30  GLUCOSE 113*  BUN 11  CREATININE 1.34  CALCIUM 9.3  MG --  PHOS --   Liver Function Tests:  [facility] 01/16/12 0828  AST 13  ALT 17  ALKPHOS 61  BILITOT 0.7  PROT 6.7  ALBUMIN 3.6     Basename 01/16/12 0828  WBC 11.8*  NEUTROABS 8.5*  HGB 13.9  HCT 40.6  MCV 85.5  PLT 143*   D-Dimer:  Basename 01/16/12 0828  DDIMER 1.72*   CBG:  Basename 01/16/12 0752  GLUCAP 108*    Other results: EKG:  normal sinus rhythm", mild < 1mm ST elevation in V2 through V6"unchanged from previous tracings from 05/13"}.  Assessment & Plan by Problem: 36 year old man with past medical history significant for recurrent PE presents to the ER with an episode of syncope. He had right lower extremity Doppler in the ED that confirmed DVT.   #Syncope: Patient presents with an episode of syncope on the morning of his admission. Given his history of recurrent PE with Dopplers confirming the right lower extremity DVT  in the setting of being off from Coumadin for last 3 weeks , we suspect that his syncope was likely secondary to another recurrent pulmonary embolism. He did complain of  premonitory symptoms of dyspnea and lightheadedness which are probably related to his recurrent PE. His Well's score is more than 6 , making PE as high probability and he falls under high prob under modified Geneva  score.  His last echo from May 2013 was reviewed that showed an EF of 60-65% and no regional wall motion abnormality. His valves were normal. Neurological causes of the syncope seems less likely in the absence of any focal neurological deficits.Would hold off on CT angio as it would not change our further management. He will need anticoagulation lifelong. - Admit to telemetry bed -Orthostatic vitals -Start him on Lovenox and Coumadin -Educated him on medication adherence   #  Tobacco abuse: He was counseled and educated on tobacco cessation. - Nicotine patch.  #DVT: Therapeutic anticoagulation      Signed: Saxon Barich 01/16/2012, 5:42 PM

## 2012-01-16 NOTE — ED Provider Notes (Signed)
History     CSN: [id number]  Arrival date & time 01/16/12  6578   First MD Initiated Contact with Patient 01/16/12 0813      Chief Complaint  Patient presents with  . Loss of Consciousness    (Consider location/radiation/quality/duration/timing/severity/associated sxs/prior treatment) HPI Comments: Patient has history of PE in 2011, again in 2013.  Has been off coumadin as he has not had transportation to get to doctor appointments.  This morning he woke up and got into the shower.  When he getting out he passed out and woke up with the dog licking his face.  He denies any seizure activity.  Never happened before.   He reports having swelling and pain in the back of his knee and right calf for the past week.  He was planning to come here for this prior to this morning's episode.  Patient is a 36 y.o. male presenting with syncope. The history is provided by the patient.  Loss of Consciousness This is a new problem. The current episode started less than 1 hour ago. Episode frequency: once. The problem has been resolved. Nothing aggravates the symptoms. Nothing relieves the symptoms.    Past Medical History  Diagnosis Date  . PE (pulmonary embolism)     Past Surgical History  Procedure Date  . No past surgeries   . Mouth surgery     History reviewed. No pertinent family history.  History  Substance Use Topics  . Smoking status: Current Every Day Smoker -- 1.0 packs/day for 5 years    Types: Cigarettes  . Smokeless tobacco: Never Used  . Alcohol Use: Yes     occassional      Review of Systems  Cardiovascular: Positive for syncope.  All other systems reviewed and are negative.    Allergies  Review of patient's allergies indicates no known allergies.  Home Medications   Current Outpatient Rx  Name Route Sig Dispense Refill  . ENOXAPARIN SODIUM 120 MG/0.8ML [place] SOLN Subcutaneous Inject 0.73 mLs (110 mg total) into the skin 2 (two) times daily. 8 Syringe 0  .  IBUPROFEN 200 MG PO TABS Oral Take 400 mg by mouth every 6 (six) hours as needed. For pain    . WARFARIN SODIUM 5 MG PO TABS  Take as directed by anticoagulation clinic provider. 30 tablet 3    BP 135/84  Pulse 86  Temp 98.9 F (37.2 C) (Oral)  Resp 17  SpO2 98%  Physical Exam  Nursing note and vitals reviewed. Constitutional: He is oriented to person, place, and time. He appears well-developed and well-nourished. No distress.  HENT:  Head: Normocephalic and atraumatic.  Mouth/Throat: Oropharynx is clear and moist.  Neck: Normal range of motion. Neck supple.  Cardiovascular: Normal rate and regular rhythm.   No murmur heard. Pulmonary/Chest: Effort normal and breath sounds normal. No respiratory distress. He has no wheezes.  Abdominal: Soft. Bowel sounds are normal. He exhibits no distension. There is no tenderness.  Musculoskeletal: Normal range of motion.       There is ttp in the right calf and popliteal region.  There is no distal swelling or edema.  DP pulses are intact.    Neurological: He is alert and oriented to person, place, and time.  Skin: Skin is warm and dry. He is not diaphoretic.    ED Course  Procedures (including critical care time)  Labs Reviewed  GLUCOSE, CAPILLARY - Abnormal; Notable for the following:    Glucose-Capillary 108 (*)  All other components within normal limits  CBC WITH DIFFERENTIAL  COMPREHENSIVE METABOLIC PANEL  D-DIMER, QUANTITATIVE   No results found.   No diagnosis found.   Date: 01/16/2012  Rate: 88  Rhythm: normal sinus rhythm  QRS Axis: normal  Intervals: normal  ST/T Wave abnormalities: early repolarization  Conduction Disutrbances:none  Narrative Interpretation:   Old EKG Reviewed: unchanged    MDM  The patient presents with calf pain and syncope and a positive venous doppler for dvt.  He has had pe in the past on two occasions and this may well be the cause of the syncope.  He is stable and there is no hypoxia,  however I have consulted [facility] for admission for anticoagulation.          Geoffery Lyons, MD 01/16/12 1154

## 2012-01-16 NOTE — Progress Notes (Addendum)
ANTICOAGULATION CONSULT NOTE - Initial Consult  Pharmacy Consult for Heparin Indication: RLE DVT  No Known Allergies  Patient Measurements: Height: 6\' 4"  (193 cm) Weight: 250 lb (113.399 kg) IBW/kg (Calculated) : 86.8  Heparin Dosing Weight: 110 kg  Vital Signs: Temp: 98.1 F (36.7 C) (10/28 1232) Temp src: Oral (10/28 1232) BP: 131/83 mmHg (10/28 1232) Pulse Rate: 80  (10/28 1232)  Labs:  [facility] 01/16/12 0828  HGB 13.9  HCT 40.6  PLT 143*  APTT --  LABPROT --  INR --  HEPARINUNFRC --  CREATININE 1.34  CKTOTAL --  CKMB --  TROPONINI --    Estimated Creatinine Clearance: 107 ml/min (by C-G formula based on Cr of 1.34).   Medical History: Past Medical History  Diagnosis Date  . PE (pulmonary embolism)     Assessment: 36 y.o. M with hx of a prior PE in '11/'13 who presented to the [facility] with LOC and increasing swelling and pain in his RLE for 1 week. Dopplers were positive for a RLE DVT and pharmacy was consulted to start heparin for anticoagulation.  The patient was noted to be noncompliant with his warfarin PTA and had been off of this for ~1 month. No baseline INR taken on admission. Discussed any concern for CVA with Dr. [name] given LOC on presentation with prior clotting history and he stated no concern for this. No head CT done on admit. Given this information, will give full bolus and start heparin drip. Baseline Hgb/Hct wnl, plts slightly low at 143.  Goal of Therapy:  Heparin level 0.3-0.7 units/ml Monitor platelets by anticoagulation protocol: Yes   Plan:  1. Heparin bolus of 4000 units x 1 2. Initiate heparin drip at rate of 1750 units/hr (17.5 ml/hr) 3. Daily heparin levels, CBC 4. Will continue to monitor for any signs/symptoms of bleeding and will follow up with heparin level in 6 hours  5. Will f/u plans for start or oral anticoagulation (either resuming warfarin or starting an alternative agent)  Georgina Pillion, PharmD, BCPS Clinical  Pharmacist Pager: [phone] 01/16/2012 12:45 PM

## 2012-01-16 NOTE — Progress Notes (Signed)
VASCULAR LAB PRELIMINARY  PRELIMINARY  PRELIMINARY  PRELIMINARY  Bilateral lower extremity venous duplex completed.    Preliminary report:  Right - Positive for an acute DVT  Coursing from the mid calf in the gastrocnemius, posterior tibial, and peroneal veins through the popliteal into the mid femoral vein. There is a total occlusion of the proximal popliteal vein. No evidence of a superficial thrombus was noted at this time nor a Baker's cyst. The left lower extremity was evaluated per protocol. Left:  No evidence of DVT, superficial thrombosis, or Baker's cyst.   Romero Letizia, RVS 01/16/2012, 10:53 AM

## 2012-01-17 DIAGNOSIS — N181 Chronic kidney disease, stage 1: Secondary | ICD-10-CM

## 2012-01-17 DIAGNOSIS — I82409 Acute embolism and thrombosis of unspecified deep veins of unspecified lower extremity: Secondary | ICD-10-CM

## 2012-01-17 LAB — LUPUS ANTICOAGULANT PANEL
Lupus Anticoagulant: DETECTED — AB
PTT Lupus Anticoagulant: 72.1 secs — ABNORMAL HIGH (ref 28.0–43.0)

## 2012-01-17 LAB — PROTIME-INR: INR: 1.11 (ref 0.00–1.49)

## 2012-01-17 LAB — COMPREHENSIVE METABOLIC PANEL
ALT: 27 U/L (ref 0–53)
Alkaline Phosphatase: 62 U/L (ref 39–117)
CO2: 27 mEq/L (ref 19–32)
GFR calc Af Amer: 70 mL/min — ABNORMAL LOW (ref >90)
GFR calc non Af Amer: 61 mL/min — ABNORMAL LOW (ref >90)
Glucose, Bld: 101 mg/dL — ABNORMAL HIGH (ref 70–99)
Potassium: 4.1 mEq/L (ref 3.5–5.1)
Sodium: 143 mEq/L (ref 135–145)

## 2012-01-17 LAB — CBC
HCT: 39.6 % (ref 39.0–52.0)
Hemoglobin: 13.3 g/dL (ref 13.0–17.0)
MCH: 29 pg (ref 26.0–34.0)
MCHC: 33.6 g/dL (ref 30.0–36.0)
RBC: 4.58 MIL/uL (ref 4.22–5.81)

## 2012-01-17 LAB — PROTEIN C ACTIVITY: Protein C Activity: 136 % — ABNORMAL HIGH (ref 75–133)

## 2012-01-17 LAB — BETA-2-GLYCOPROTEIN I ABS, IGG/M/A
Beta-2 Glyco I IgG: 0 G Units (ref <20)
Beta-2-Glycoprotein I IgM: 6 M Units (ref <20)

## 2012-01-17 MED ORDER — WARFARIN SODIUM 7.5 MG PO TABS
7.5000 mg | ORAL_TABLET | Freq: Once | ORAL | Status: AC
  Administered 2012-01-17: 7.5 mg via ORAL
  Filled 2012-01-17: qty 1

## 2012-01-17 MED ORDER — OXYCODONE-ACETAMINOPHEN 5-325 MG PO TABS
2.0000 | ORAL_TABLET | ORAL | Status: DC | PRN
  Administered 2012-01-17: 2 via ORAL
  Filled 2012-01-17: qty 2

## 2012-01-17 MED ORDER — ENOXAPARIN SODIUM 100 MG/ML ~~LOC~~ SOLN: 110.0000 mg | Freq: Two times a day (BID) | SUBCUTANEOUS | Status: DC

## 2012-01-17 MED ORDER — WARFARIN SODIUM 7.5 MG PO TABS: 7.5000 mg | ORAL_TABLET | Freq: Once | ORAL | Status: DC

## 2012-01-17 MED ORDER — ENOXAPARIN (LOVENOX) PATIENT EDUCATION KIT
PACK | Freq: Once | Status: AC
  Administered 2012-01-17: 18:00:00
  Filled 2012-01-17: qty 1

## 2012-01-17 NOTE — Progress Notes (Signed)
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   Medications:  Lovenox 110mg  SQ q12  Assessment: 36yom on Lovenox bridging to Coumadin (Day 2 of minimum 5 Day overlap) for DVT and suspected recurrent PE. Patient had previously been on Coumadin but had stopped ~ 3 weeks ago. Dr. Chancy Milroy had followed patient and the last therapeutic regimen was reported as Coumadin 5mg  daily except 7.5mg  on Mondays and Thursdays. INR (1.11) is subtherapeutic as expected after Coumadin 10mg  x 1.  - H/H stable, Plts trended down - No significant bleeding reported - Wt: 107kg, CrCl 95 ml/min  Goal of Therapy:  INR 2-3 Anti-Xa level 0.6-1.2 units/ml 4hrs after LMWH dose given Monitor platelets by anticoagulation protocol: Yes   Plan:  1. Coumadin 7.5mg  po x 1 today 2. Continue Lovenox 110mg  SQ q12h 3. Follow-up AM INR and discharge plans  Cleon Dew [phone] 01/17/2012,2:10 PM

## 2012-01-17 NOTE — Progress Notes (Signed)
   CARE MANAGEMENT NOTE 01/17/2012  Patient:  BRAHM, BARBEAU   Account Number:  [id number]  Date Initiated:  01/17/2012  Documentation initiated by:  GRAVES-BIGELOW,Lanise Mergen  Subjective/Objective Assessment:   Pt admitted with syncope and +DVT. On IV heparin gtt. Pt without insurance. 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 Usually gets rides from friends.     Action/Plan:   CM will continue to monitor for disposition needs. CM did speak to CSW [place] and she will f/u with transportation needs for pt.   Anticipated DC Date:  01/19/2012   Anticipated DC Plan:  HOME/SELF CARE      DC Planning Services  CM consult  Medication Assistance      Choice offered to / List presented to:             Status of service:  Completed, signed off Medicare Important Message given?   (If response is "NO", the following Medicare IM given date fields will be blank) Date Medicare IM given:   Date Additional Medicare IM given:    Discharge Disposition:  HOME/SELF CARE  Per UR Regulation:  Reviewed for med. necessity/level of care/duration of stay  If discussed at Long Length of Stay Meetings, dates discussed:    Comments:  01-17-12 1228 Tomi Bamberger, RN,BSN [phone] CM did assist pt with medications lovenox via the [employer] pt assitance. RN to pick up medicaitons before pt is d/c. NO furter needs from CM at this time.

## 2012-01-17 NOTE — H&P (Signed)
Internal Medicine Attending Admission Note Date: 01/17/2012  Patient name: TANISH PRIEN Medical record number: [id number] Date of birth: [birth year] Age: 36 y.o. Gender: male  I saw and evaluated the patient. I reviewed the resident's note and I agree with the resident's findings and plan as documented in the resident's note.  Chief Complaint(s): Syncope  History - key components related to admission:  Mr. Dieudonne is a 36 year old man with a history of pulmonary embolism x 2 who presents with syncope. He was in his usual state of health until the day of admission when he woke up feeling well and went to the shower. As he was getting out of the shower he noted some slight dizziness without vertigo, chest pain, shortness of breath, palpitations, headache, numbness, or weakness. The next thing he remembers is being woken up by his dog licking his face. He's not sure how he fell but had no overt injuries or pain. Of note, he no longer could afford his Coumadin as of 4 weeks ago and has been off since. He also admits, in retrospect, that he's had some mild pain in the right calf with perceived swelling. Upon regaining consciousness he noted some shortness of breath but denied any chest pain. In the ED a right lower extremity Doppler ultrasound was obtained which revealed a right lower extremity deep venous thrombosis. He was admitted to the internal medicine teaching service for further care. He currently is without complaints and is ready for discharge home.  Physical Exam - key components related to admission:  Filed Vitals:   01/16/12 1830 01/16/12 1833 01/16/12 2100 01/17/12 0500  BP: 142/91 142/95 149/83 116/72  Pulse: 99 98 82 75  Temp:   98.3 F (36.8 C) 97.7 F (36.5 C)  TempSrc:      Resp:   18 18  Height:      Weight:      SpO2:   96% 97%   General: Well-developed, well-nourished, man lying comfortably in bed in no acute distress. Lungs: Clear to auscultation bilaterally without  wheezes, rhonchi, rales. Heart: Regular rate and rhythm without murmurs, rubs, or gallops. Abdomen: Soft, nontender, active bowel sounds. Extremities: Without edema or palpable cords. The right lower extremity is without abnormalities when compared to the left lower extremity.  Lab results:  Basic Metabolic Panel:  Basename 01/17/12 0642 01/16/12 0828  NA 143 141  K 4.1 3.5  CL 107 104  CO2 27 30  GLUCOSE 101* 113*  BUN 15 11  CREATININE 1.47* 1.34  CALCIUM 8.9 9.3  MG -- --  PHOS -- --   Liver Function Tests:  [facility] 01/17/12 0642 01/16/12 0828  AST 25 13  ALT 27 17  ALKPHOS 62 61  BILITOT 0.2* 0.7  PROT 6.1 6.7  ALBUMIN 3.0* 3.6   CBC:  Basename 01/17/12 0642 01/16/12 0828  WBC 6.7 11.8*  NEUTROABS -- 8.5*  HGB 13.3 13.9  HCT 39.6 40.6  MCV 86.5 85.5  PLT 137* 143*   D-Dimer:  Basename 01/16/12 0828  DDIMER 1.72*   CBG:  Basename 01/16/12 0752  GLUCAP 108*   Coagulation:  Basename 01/17/12 0642 01/16/12 1815  INR 1.11 1.07   Imaging results:   RLE Doppler Ultrasound: Positive right lower extremity DVT.  Other results:  EKG: NSR at 90 bpm, normal axis and intervals, no LVH or significant Q waves, good R wave progression, inferior T-wave inversion.  Assessment & Plan by Problem:  Mr. Greenleaf is a 36 year old man with a  history of prior thromboembolic disease who presents with acute syncope. Important historical features include the fact that he stopped his Coumadin 4 weeks ago because he could no longer afford it and he had some pain and mild swelling in his right calf. Lower extremity Doppler ultrasound revealed a deep venous thrombosis. Therefore the most likely cause of his symptoms of syncope and lower extremity pain include venous thromboembolism to the lungs. Risk factors include hypercoagulability of a yet undiagnosed defect and noncompliance with his anticoagulation regimen. 13% of pulmonary emboli will present with syncope. Less than 50% of  patients with diagnosed pulmonary emboli will have positive Doppler ultrasounds. This number is improved in patients with symptoms or signs for DVT as Mr. Brechtel had. Although the differential for syncope is high in this clinical setting, pulmonary embolism is exceptionally high on the differential diagnosis list. No further workup is necessary at this time as lifelong anticoagulation is needed and this fact will not be changed by further testing.  1) Venous thromboembolism: Mr. Hall was started on low molecular weight heparin and Coumadin. He will be continued on low molecular weight heparin for at least 5 days and at least 48 hours after he achieves an INR greater than 2 from the Coumadin. The importance of continuing his Coumadin was stressed. We discussed potential barriers for him to get further care and he noted transportation was one of them. The case manager is working on figuring out transportation options for him to make his anticoagulation clinic visits. They're also working on ways to obtain Lovenox as a bridge until he is INR is therapeutic. If these issues can be addressed I anticipate Mr. Victory will be discharged home today.

## 2012-01-17 NOTE — Progress Notes (Signed)
Medical Student Daily Progress Note  Subjective: Patient was stable overnight with no complaint.  Patient states that he feels his right leg is swollen and warmer than the other leg.  Denied HA, dizziness, SOB, or CP.  Patient agrees that transportation is the major barrier for him to get adequate anticoagulation. He thinks that he should be able to get adequate care as long as he can get to our clinic for follow-up.    Objective: Vital signs in last 24 hours: Filed Vitals:   01/16/12 1830 01/16/12 1833 01/16/12 2100 01/17/12 0500  BP: 142/91 142/95 149/83 116/72  Pulse: 99 98 82 75  Temp:   98.3 F (36.8 C) 97.7 F (36.5 C)  TempSrc:      Resp:   18 18  Height:      Weight:      SpO2:   96% 97%   Weight change:   Intake/Output Summary (Last 24 hours) at 01/17/12 1317 Last data filed at 01/17/12 0800  Gross per 24 hour  Intake    360 ml  Output      0 ml  Net    360 ml   Physical Exam: General appearance: alert, cooperative, appears stated age and sitting in bed with no appearance distress Throat: lips, mucosa, and tongue normal; teeth and gums normal Lungs: normal breath sounds bilaterally without audible crackles, rhales, or wheezes though patient did not see to breathe with full effort when asked to Chest wall: no tenderness, no heaves Heart: regular rate and rhythm, S1, S2 normal, no murmur, click, rub or gallop and no prominant S2 Abdomen: soft, non-tender; bowel sounds normal; no masses,  no organomegaly Extremities: extremities normal, atraumatic, no cyanosis or edema and right leg and foot without erythema or edema, equal temperature compared to the left leg. + Homens sign on the right Pulses: 2+ and symmetric Skin: Skin color, texture, turgor normal. No rashes or lesions Neurologic: AAOx3, able to move all extremities, normal strength, and no noticeable sensory deficit.   Lab Results: Basic Metabolic Panel:  Lab 01/17/12 6962 01/16/12 0828  NA 143 141  K 4.1 3.5   CL 107 104  CO2 27 30  GLUCOSE 101* 113*  BUN 15 11  CREATININE 1.47* 1.34  CALCIUM 8.9 9.3  MG -- --  PHOS -- --   Liver Function Tests:  Lab 01/17/12 0642 01/16/12 0828  AST 25 13  ALT 27 17  ALKPHOS 62 61  BILITOT 0.2* 0.7  PROT 6.1 6.7  ALBUMIN 3.0* 3.6   CBC:  Lab 01/17/12 0642 01/16/12 0828  WBC 6.7 11.8*  NEUTROABS -- 8.5*  HGB 13.3 13.9  HCT 39.6 40.6  MCV 86.5 85.5  PLT 137* 143*   Coagulation:  Lab 01/17/12 0642 01/16/12 1815  LABPROT 14.2 13.8  INR 1.11 1.07   Medications: medication reviewed. Scheduled Meds:   . coumadin book   Does not apply Once  . enoxaparin (LOVENOX) injection  110 mg Subcutaneous Q12H  . heparin  4,000 Units Intravenous Once  . warfarin  10 mg Oral ONCE-1800  . warfarin   Does not apply Once  . Warfarin - Pharmacist Dosing Inpatient   Does not apply q1800  . DISCONTD: enoxaparin (LOVENOX) injection  1 mg/kg Subcutaneous Q12H   Continuous Infusions:   . sodium chloride 100 mL/hr at 01/16/12 1817  . DISCONTD: heparin 1,750 Units/hr (01/16/12 1404)   PRN Meds:.alum & mag hydroxide-simeth, ondansetron (ZOFRAN) IV, oxyCODONE-acetaminophen, DISCONTD: acetaminophen, DISCONTD: acetaminophen, DISCONTD:  HYDROmorphone (DILAUDID) injection, DISCONTD:  morphine injection Assessment/Plan: Principal Problem:  *Syncope Active Problems:  TOBACCO USER  DVT (deep venous thrombosis)  [name] is a 36 year old man with hx of recurrent PE presented to the hospital after an episode of syncope.  He also endorses right calf pain and he had stopped taking Warfarin last month.   PE-->Syncope:  [name] presented to the hospital after a syncopal episode the morning of this admission.  He experienced dizziness and SOB prior to passing out but denied palpitation, CP, numbness of face or extremities. DDx include new episode of PE, arrythmia, stroke, seizure, hypovolemia, and hypoglycemia.  Syncope is most likely caused by PE given patient's  reported calf pain in the past week, SOB prior to syncope, non-compliance with anticoagulation in the last month, and hx of recurrent PE in 2010 and 2012.  In addition, LE dopplers has shown a clot in his right LE.  INR on admission was 1.07.  His vital signs on admission was stable, with a heart rate of 86 beats per minute and oxygen saturation of 98% on room air. EKG does not show any right heart strain. CBG on admission was 108, making hypoglycemia less likely.  Patient has no history of arrythmia, stroke, or seizures and he does not exhibits clinical symptoms such as palpitations, numbness of face or extremities, and dysarthria, making these diagnosis less likely but not excluded.  Patient has a Geneva score of 7 (3 for previous PE and 4 for calf pain), indicating patient has an intermediate risk for having a PE.  Since LE Doppler has shown a clot in RLE, spiral CT was not necessary.  Anticoagulation will be re-started for patient and INR will be followed up in clinic after discharge -continue tele until discharge -fall precautions -cont Lovenox as bridge -warfarin per pharmacy          Smoking: Patient currently smokes about half a pack a day. This puts him at an increased risk for pulmonary embolism.  -Cigarette smoking cessation counseling will be provided while he is in the hospital..    LOS: 1 day   This is a Psychologist, occupational Note.  The care of the patient was discussed with Dr. Dorthula Rue and the assessment and plan formulated with their assistance.  Please see their attached note for official documentation of the daily encounter.  [name] 01/17/2012, 1:17 PM  Resident Co-sign Daily Note: I have seen the patient and reviewed the daily progress note by [place] MS IV and discussed the care of the patient with them.  See below for documentation of my findings, assessment, and plans.  Subjective: He denies any complaints - including chest pain , SOB or dizziness. No new episode of syncope  during this hospitalization.  Objective: Vital signs in last 24 hours: Filed Vitals:   01/16/12 1830 01/16/12 1833 01/16/12 2100 01/17/12 0500  BP: 142/91 142/95 149/83 116/72  Pulse: 99 98 82 75  Temp:   98.3 F (36.8 C) 97.7 F (36.5 C)  TempSrc:      Resp:   18 18  Height:      Weight:      SpO2:   96% 97%   Physical Exam: BP 116/72  Pulse 75  Temp 97.7 F (36.5 C) (Oral)  Resp 18  Ht 6\' 4"  (1.93 m)  Wt 236 lb 3.2 oz (107.14 kg)  BMI 28.75 kg/m2  SpO2 97%  General Appearance:    Alert, cooperative, no distress, appears stated  age  Head:    Normocephalic, without obvious abnormality, atraumatic  Eyes:    PERRL, conjunctiva/corneas clear, EOM's intact, fundi    benign, both eyes  Ears:    Normal TM's and external ear canals, both ears  Nose:   Nares normal, septum midline, mucosa normal, no drainage    or sinus tenderness  Throat:   Lips, mucosa, and tongue normal; teeth and gums normal  Neck:   Supple, symmetrical, trachea midline, no adenopathy;    thyroid:  no enlargement/tenderness/nodules; no carotid   bruit or JVD  Back:     Symmetric, no curvature, ROM normal, no CVA tenderness  Lungs:     Clear to auscultation bilaterally, respirations unlabored  Chest Wall:    No tenderness or deformity   Heart:    Regular rate and rhythm, S1 and S2 normal, no murmur, rub   or gallop  Breast Exam:    No tenderness, masses, or nipple abnormality  Abdomen:     Soft, non-tender, bowel sounds active all four quadrants,    no masses, no organomegaly  Genitalia:    Normal male without lesion, discharge or tenderness  Rectal:    Normal tone, normal prostate, no masses or tenderness;   guaiac negative stool  Extremities:   Mild left leg swelling as compared to right.  Pulses:   2+ and symmetric all extremities  Skin:   Skin color, texture, turgor normal, no rashes or lesions  Lymph nodes:   Cervical, supraclavicular, and axillary nodes normal  Neurologic:   CNII-XII intact,  normal strength, sensation and reflexes    throughout   Lab Results: Reviewed and documented in Electronic Record Micro Results: Reviewed and documented in Electronic Record Studies/Results: Reviewed and documented in Electronic Record Medications: I have reviewed the patient's current medications. Scheduled Meds:   . coumadin book   Does not apply Once  . enoxaparin (LOVENOX) injection  110 mg Subcutaneous Q12H  . enoxaparin   Does not apply Once  . warfarin  10 mg Oral ONCE-1800  . warfarin  7.5 mg Oral ONCE-1800  . warfarin   Does not apply Once  . Warfarin - Pharmacist Dosing Inpatient   Does not apply q1800  . DISCONTD: enoxaparin (LOVENOX) injection  1 mg/kg Subcutaneous Q12H   Continuous Infusions:   . sodium chloride 100 mL/hr at 01/16/12 1817  . DISCONTD: heparin 1,750 Units/hr (01/16/12 1404)   PRN Meds:.alum & mag hydroxide-simeth, ondansetron (ZOFRAN) IV, oxyCODONE-acetaminophen, DISCONTD: acetaminophen, DISCONTD: acetaminophen, DISCONTD:  HYDROmorphone (DILAUDID) injection, DISCONTD:  morphine injection Assessment/Plan: 36 year old man with past medical history significant for recurrent PE presents to the ER with an episode of syncope. He had right lower extremity Doppler in the ED that confirmed DVT.   #VTE: Patient denies having any further episodes of syncope. He was found to have right lower extremity DVT on the Dopplers and probably recurrent PE led to his syncopal event. He was started on Lovenox and Coumadin yesterday. He would be continued on low molecular weight heparin 5 days and at least 48 hours after he achieves INR greater than 2. He has followup appointment scheduled with Dr. Alexandria Lodge for Friday at 9:30 AM. -D/C home on Lovenox and Coumadin  -Educated him on medication adherence   #Tobacco abuse: He was counseled and educated on tobacco cessation.  - Nicotine patch.   #Acute kidney injury/CKD stage I: Patient bumped his creatinine from 1.34 at admission  to 1.47 this AM. He has history of elevated creatinine to  1.4 in the past. He could be developing CKD .Etiology unclear.Would get further work up in clinic if needed.  - Check UA - Recheck BMET at clinic follow up.  #DVT: Therapeutic anticoagulation      LOS: 1 day   [name] 01/17/2012, 2:53 PM

## 2012-01-17 NOTE — Discharge Summary (Signed)
Internal Medicine Teaching [facility] Discharge Note  Name: [name] MRN: [id number] DOB: 09/20/1975 36 y.o.  Date of Admission: 01/16/2012  7:47 AM Date of Discharge: 01/17/2012 Attending Physician: Mariana Arn, MD  Discharge Diagnosis: Principal Problem:  *Syncope likely 2/2 recurrent PE.   Left lower extremity DVT   Tobacco user   Discharge Medications:   Medication List     As of 01/17/2012  3:14 PM    TAKE these medications         enoxaparin 100 MG/ML injection   Commonly known as: LOVENOX   Inject 1.1 mLs (110 mg total) into the skin every 12 (twelve) hours.      warfarin 7.5 MG tablet   Commonly known as: COUMADIN   Take 1 tablet (7.5 mg total) by mouth one time only at 6 PM.        Disposition and follow-up:   [name] was discharged from [facility] in Stable condition.  At the hospital follow up visit please address : 1. BMET: His Cr bumped up at discharge with etiology unknown. Please recheck BMET at follow up appointment. Please follow up results of UA that were pending at d/c.  2. Councel him on medication adherence   Follow-up Appointments:     Follow-up Information    Follow up with Gar Ponto, PHARMD. On 01/20/2012. (at 9:30 AM)    Contact information:   - - - - [place] [address] [phone]       Follow up with Elyse Jarvis, MD. On 01/23/2012. (9:00)    Contact information:   [address] [place] [address] [phone]         Discharge Orders    Future Appointments: Provider: Department: Dept Phone: Center:   01/20/2012 10:00 AM [facility] [phone] [facility]   01/24/2012 9:00 AM Elyse Jarvis, MD [facility] [phone] [facility]     Future Orders Please Complete By Expires   Diet - low sodium heart healthy      Increase activity slowly      Discharge instructions      Comments:   Please keep up with your follow up appointment.   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  He was in usual state of his health until a week ago when he started having some pain and swelling in his right lower extremity. He denies any redness associated with it and states that his legs usually get swollen in the evening. On the morning of his admission he had an episode of syncope after he finished his shower. He states that he fell dizzy and short of breath and gradually fell on the floor. He could not exactly know the duration of his loss of consciousness but states that he found himself lying on the floor and dog licking him up when he woke up. He continued to feel short of breath and lightheaded and decided to come to the ER. Of note patient was supposed to be on Coumadin for his second episode of PE since May of 2013 but he has been  off his Coumadin for last 3 weeks due to transportation issues.  Physical Exam:  Blood pressure 119/81, pulse 81, temperature 97.8 F (36.6 C), temperature source Oral, resp. rate 19, height 6\' 4"  (1.93 m), weight 236 lb 3.2 oz (107.14 kg), SpO2 98.00%.  General appearance: alert, cooperative, appears stated age, no distress and seated in bed and having lunch  Head: Normocephalic, without obvious abnormality, atraumatic  Eyes: conjunctivae/corneas clear. PERRL, EOM's intact. Fundi benign.  Neck: no adenopathy, no carotid bruit, no JVD, supple, symmetrical, trachea midline and thyroid not enlarged, symmetric, no tenderness/mass/nodules  Back: symmetric, no curvature. ROM normal. No CVA tenderness.  Lungs: clear to auscultation bilaterally  Chest wall: no tenderness  Heart: regular rate and rhythm, S1, S2 normal, no murmur,  click, rub or gallop  Abdomen: soft, non-tender; bowel sounds normal; no masses, no organomegaly  Extremities: Right lower extremity marked for slight warmth without any areas of erythema or tenderness. Visually it does not appear to be larger than the left.  Pulses: 2+ and symmetric  Skin: Skin color, texture, turgor normal. No rashes or lesions  Neurologic: Alert and oriented X 3, normal strength and tone. Normal symmetric reflexes. Normal coordination and gait  Lab results:  Basic Metabolic Panel:   [facility]  01/16/12 0828   NA  141   K  3.5   CL  104   CO2  30   GLUCOSE  113*   BUN  11   CREATININE  1.34   CALCIUM  9.3   MG  --   PHOS  --    Liver Function Tests:   [facility]  01/16/12 0828   AST  13   ALT  17   ALKPHOS  61   BILITOT  0.7   PROT  6.7   ALBUMIN  3.6     Basename  01/16/12 0828   WBC  11.8*   NEUTROABS  8.5*   HGB  13.9   HCT  40.6   MCV  85.5   PLT  143*    D-Dimer:   Basename  01/16/12 0828   DDIMER  1.72*    CBG:   Basename  01/16/12 0752   GLUCAP  108*    Drugs of Abuse    Component  Value  Date/Time    LABOPIA  NONE DETECTED  10/27/2009 0217    COCAINSCRNUR  POSITIVE*  10/27/2009 0217    LABBENZ  NONE DETECTED  10/27/2009 0217    AMPHETMU  NONE DETECTED  10/27/2009 0217    THCU  NONE DETECTED  10/27/2009 0217    LABBARB  Value: NONE DETECTED DRUG SCREEN FOR MEDICAL PURPOSES ONLY. IF CONFIRMATION IS NEEDED FOR ANY PURPOSE, NOTIFY LAB WITHIN 5 DAYS. LOWEST DETECTABLE LIMITS FOR URINE DRUG SCREEN Drug Class Cutoff (ng/mL) Amphetamine 1000 Barbiturate 200 Benzodiazepine 200 Tricyclics 300 Opiates 300 Cocaine 300 THC 50  10/27/2009 0217      Dopplers: Right - Positive for an acute DVT Coursing from the mid calf in the gastrocnemius, posterior tibial, and peroneal veins through the popliteal into the mid femoral vein. There is a total occlusion of the proximal popliteal vein. No evidence of a superficial thrombus was noted at this time nor a Baker's  cyst. The left lower extremity was evaluated per protocol. Left: No evidence of DVT, superficial thrombosis, or Baker's cyst.   Hospital Course by problem list:  #Syncope: Patient presents with an episode of syncope on the morning of his admission. Given his history  of recurrent PE with Dopplers confirming the left lower extremity DVT in the setting of being off from Coumadin for last 3 weeks , we suspect that his syncope was likely secondary to another recurrent pulmonary embolism.CT angio was not obtained  time as lifelong anticoagulation is needed and this fact will not be changed by further testing. His hypercoagulable panel was pending at the time of discharge but regardless the results will not  change further management. He was discharged home on Lovenox and Coumadin for atleast 5 days and 48 hrs after his INR is above 2. He was schedueld a follow up appointment with Dr. Alexandria Lodge. He needs to be counseled on medication adherence has follow up.  #Elevated Cr: Patient has a history of decreased GFR and falls under CKD stage I. His creatinine bumped from 1.34 at admission to 1. 47 at discharge with etiology unclear. Will recheck his been met with clinic follow up. Please followup of his UA that was pending at the time of discharge.  # Tobacco abuse: He was counseled on tobacco cessation during this hospitalization.        Discharge Vitals:  BP 116/72  Pulse 75  Temp 97.7 F (36.5 C) (Oral)  Resp 18  Ht 6\' 4"  (1.93 m)  Wt 236 lb 3.2 oz (107.14 kg)  BMI 28.75 kg/m2  SpO2 97%  Discharge Labs:  Results for orders placed during the hospital encounter of 01/16/12 (from the past 24 hour(s))  ANTITHROMBIN III     Status: Normal   Collection Time   01/16/12  4:41 PM      Component Value Range   AntiThromb III Func 97  75 - 120 %  PROTEIN C ACTIVITY     Status: Abnormal   Collection Time   01/16/12  4:41 PM      Component Value Range   Protein C Activity 136 (*) 75 - 133 %  PROTEIN S  ACTIVITY     Status: Normal   Collection Time   01/16/12  4:41 PM      Component Value Range   Protein S Activity 107  69 - 129 %  LUPUS ANTICOAGULANT PANEL     Status: Abnormal   Collection Time   01/16/12  4:41 PM      Component Value Range   PTT Lupus Anticoagulant 72.1 (*) 28.0 - 43.0 secs   PTTLA Confirmation 9.5 (*) <8.0 secs   PTTLA 4:1 Mix 62.1 (*) 28.0 - 43.0 secs   Drvvt 35.3  <42.9 secs   Drvvt confirmation NOT APPL  <1.11 Ratio   dRVVT Incubated 1:1 Mix NOT APPL  <42.9 secs   Lupus Anticoagulant DETECTED (*) NOT DETECTED  BETA-2-GLYCOPROTEIN I ABS, IGG/M/A     Status: Normal   Collection Time   01/16/12  4:41 PM      Component Value Range   Beta-2 Glyco I IgG 0  <20 G Units   Beta-2-Glycoprotein I IgM 6  <20 M Units   Beta-2-Glycoprotein I IgA 6  <20 A Units  HOMOCYSTEINE     Status: Normal   Collection Time   01/16/12  4:41 PM      Component Value Range   Homocysteine 9.3  4.0 - 15.4 umol/L  CARDIOLIPIN ANTIBODIES, IGG, IGM, IGA     Status: Abnormal   Collection Time   01/16/12  4:41 PM      Component Value Range   Anticardiolipin IgG 12  <23 GPL U/mL   Anticardiolipin IgM 1 (*) <  11 MPL U/mL   Anticardiolipin IgA 5 (*) <22 APL U/mL  PROTIME-INR     Status: Normal   Collection Time   01/16/12  6:15 PM      Component Value Range   Prothrombin Time 13.8  11.6 - 15.2 seconds   INR 1.07  0.00 - 1.49  COMPREHENSIVE METABOLIC PANEL     Status: Abnormal   Collection Time   01/17/12  6:42 AM      Component Value Range   Sodium 143  135 - 145 mEq/L   Potassium 4.1  3.5 - 5.1 mEq/L   Chloride 107  96 - 112 mEq/L   CO2 27  19 - 32 mEq/L   Glucose, Bld 101 (*) 70 - 99 mg/dL   BUN 15  6 - 23 mg/dL   Creatinine, Ser 1.61 (*) 0.50 - 1.35 mg/dL   Calcium 8.9  8.4 - 09.6 mg/dL   Total Protein 6.1  6.0 - 8.3 g/dL   Albumin 3.0 (*) 3.5 - 5.2 g/dL   AST 25  0 - 37 U/L   ALT 27  0 - 53 U/L   Alkaline Phosphatase 62  39 - 117 U/L   Total Bilirubin 0.2 (*) 0.3 - 1.2  mg/dL   GFR calc non Af Amer 61 (*) >90 mL/min   GFR calc Af Amer 70 (*) >90 mL/min  PROTIME-INR     Status: Normal   Collection Time   01/17/12  6:42 AM      Component Value Range   Prothrombin Time 14.2  11.6 - 15.2 seconds   INR 1.11  0.00 - 1.49  CBC     Status: Abnormal   Collection Time   01/17/12  6:42 AM      Component Value Range   WBC 6.7  4.0 - 10.5 K/uL   RBC 4.58  4.22 - 5.81 MIL/uL   Hemoglobin 13.3  13.0 - 17.0 g/dL   HCT 04.5  40.9 - 81.1 %   MCV 86.5  78.0 - 100.0 fL   MCH 29.0  26.0 - 34.0 pg   MCHC 33.6  30.0 - 36.0 g/dL   RDW 91.4  78.2 - 95.6 %   Platelets 137 (*) 150 - 400 K/uL    Signed: Ikaika Showers 01/17/2012, 3:14 PM   Time Spent on Discharge: >30 min

## 2012-01-17 NOTE — Progress Notes (Signed)
Clinical Social Work-CSW met with pt at bedside to discuss transportation assistance-Full assessment to follow in EPIC-pt very guarded with regards to housing/income/transportation-declined CSW interventions (31 day bus pass) relaying that if he needed to pay someone to get to the bus line he may as well pay someone to get to the hospital-pt declined resources and thanked CSW for services- Jodean Lima, [phone]

## 2012-01-17 NOTE — Progress Notes (Addendum)
   CARE MANAGEMENT NOTE 01/17/2012  Patient:  [name], [name]   Account Number:  [id number]  Date Initiated:  01/17/2012  Documentation initiated by:  GRAVES-BIGELOW,Dezerae Freiberger  Subjective/Objective Assessment:   Pt admitted with syncope and +DVT. On IV heparin gtt. Pt without insurance. 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 Usually gets rides from friends. Pt states he is able to afford medications. [facility] and CM did speak to MD and he stated that they would follow him in the clinic. List of PCP's not given to pt due to MD stated they will f/u. CM received a referral for lovenox assistance. CM will discuss with pharmacists to see dosage of lovenox and amount needed.      Action/Plan:   CM will continue to monitor for disposition needs. CM did speak to CSW Chicken and she will f/u with transportation needs for pt.   Anticipated DC Date:  01/19/2012   Anticipated DC Plan:  HOME/SELF CARE      DC Planning Services  CM consult      Choice offered to / List presented to:             Status of service:  In process, will continue to follow Medicare Important Message given?   (If response is "NO", the following Medicare IM given date fields will be blank) Date Medicare IM given:   Date Additional Medicare IM given:    Discharge Disposition:    Per UR Regulation:  Reviewed for med. necessity/level of care/duration of stay  If discussed at Long Length of Stay Meetings, dates discussed:    Comments:

## 2012-01-18 LAB — PROTEIN C, TOTAL: Protein C, Total: 83 % (ref 72–160)

## 2012-01-19 ENCOUNTER — Telehealth: Payer: Self-pay | Admitting: Dietician

## 2012-01-20 ENCOUNTER — Emergency Department (HOSPITAL_COMMUNITY): Payer: Self-pay

## 2012-01-20 ENCOUNTER — Ambulatory Visit: Payer: Self-pay

## 2012-01-20 ENCOUNTER — Encounter (HOSPITAL_COMMUNITY): Payer: Self-pay | Admitting: *Deleted

## 2012-01-20 ENCOUNTER — Observation Stay (HOSPITAL_COMMUNITY)
Admission: EM | Admit: 2012-01-20 | Discharge: 2012-01-22 | Discharge status: Against Medical Advice | Payer: Self-pay | Attending: Internal Medicine | Admitting: Internal Medicine

## 2012-01-20 ENCOUNTER — Observation Stay (HOSPITAL_COMMUNITY): Payer: Self-pay

## 2012-01-20 DIAGNOSIS — Z7901 Long term (current) use of anticoagulants: Secondary | ICD-10-CM | POA: Insufficient documentation

## 2012-01-20 DIAGNOSIS — F17200 Nicotine dependence, unspecified, uncomplicated: Secondary | ICD-10-CM | POA: Diagnosis present

## 2012-01-20 DIAGNOSIS — R112 Nausea with vomiting, unspecified: Secondary | ICD-10-CM | POA: Insufficient documentation

## 2012-01-20 DIAGNOSIS — R079 Chest pain, unspecified: Principal | ICD-10-CM | POA: Insufficient documentation

## 2012-01-20 DIAGNOSIS — Z86711 Personal history of pulmonary embolism: Secondary | ICD-10-CM | POA: Insufficient documentation

## 2012-01-20 DIAGNOSIS — I2699 Other pulmonary embolism without acute cor pulmonale: Secondary | ICD-10-CM | POA: Diagnosis present

## 2012-01-20 DIAGNOSIS — R55 Syncope and collapse: Secondary | ICD-10-CM | POA: Diagnosis present

## 2012-01-20 DIAGNOSIS — F1410 Cocaine abuse, uncomplicated: Secondary | ICD-10-CM | POA: Insufficient documentation

## 2012-01-20 DIAGNOSIS — R51 Headache: Secondary | ICD-10-CM | POA: Insufficient documentation

## 2012-01-20 DIAGNOSIS — I82409 Acute embolism and thrombosis of unspecified deep veins of unspecified lower extremity: Secondary | ICD-10-CM | POA: Diagnosis present

## 2012-01-20 DIAGNOSIS — Z86718 Personal history of other venous thrombosis and embolism: Secondary | ICD-10-CM | POA: Insufficient documentation

## 2012-01-20 HISTORY — DX: Tobacco use: Z72.0

## 2012-01-20 HISTORY — DX: Family history of ischemic heart disease and other diseases of the circulatory system: Z82.49

## 2012-01-20 HISTORY — DX: Cocaine abuse, uncomplicated: F14.10

## 2012-01-20 LAB — DIFFERENTIAL
Basophils Relative: 0 % (ref 0–1)
Eosinophils Absolute: 0.1 10*3/uL (ref 0.0–0.7)
Eosinophils Relative: 1 % (ref 0–5)
Neutrophils Relative %: 77 % (ref 43–77)

## 2012-01-20 LAB — RAPID URINE DRUG SCREEN, HOSP PERFORMED
Amphetamines: NOT DETECTED
Barbiturates: NOT DETECTED
Benzodiazepines: NOT DETECTED
Cocaine: POSITIVE — AB
Tetrahydrocannabinol: NOT DETECTED

## 2012-01-20 LAB — COMPREHENSIVE METABOLIC PANEL
ALT: 98 U/L — ABNORMAL HIGH (ref 0–53)
AST: 37 U/L (ref 0–37)
Albumin: 3.5 g/dL (ref 3.5–5.2)
Calcium: 9.4 mg/dL (ref 8.4–10.5)
Potassium: 3.7 mEq/L (ref 3.5–5.1)
Sodium: 138 mEq/L (ref 135–145)
Total Protein: 6.8 g/dL (ref 6.0–8.3)

## 2012-01-20 LAB — CBC
MCH: 29.2 pg (ref 26.0–34.0)
MCHC: 34 g/dL (ref 30.0–36.0)
Platelets: 173 10*3/uL (ref 150–400)
RBC: 4.93 MIL/uL (ref 4.22–5.81)

## 2012-01-20 LAB — TSH: TSH: 3.704 u[IU]/mL (ref 0.350–4.500)

## 2012-01-20 LAB — TROPONIN I
Troponin I: <0.3 ng/mL (ref <0.30)
Troponin I: <0.3 ng/mL (ref <0.30)

## 2012-01-20 LAB — CK TOTAL AND CKMB (NOT AT ARMC)
CK, MB: 2.3 ng/mL (ref 0.3–4.0)
Total CK: 122 U/L (ref 7–232)

## 2012-01-20 LAB — PROTIME-INR
INR: 1.19 (ref 0.00–1.49)
Prothrombin Time: 14.9 seconds (ref 11.6–15.2)

## 2012-01-20 LAB — APTT: aPTT: 30 seconds (ref 24–37)

## 2012-01-20 MED ORDER — ENOXAPARIN SODIUM 120 MG/0.8ML ~~LOC~~ SOLN
110.0000 mg | Freq: Two times a day (BID) | SUBCUTANEOUS | Status: DC
  Filled 2012-01-20: qty 0.8

## 2012-01-20 MED ORDER — WARFARIN SODIUM 10 MG PO TABS
10.0000 mg | ORAL_TABLET | Freq: Once | ORAL | Status: AC
  Administered 2012-01-20: 10 mg via ORAL
  Filled 2012-01-20: qty 1

## 2012-01-20 MED ORDER — WARFARIN - PHARMACIST DOSING INPATIENT: Freq: Every day | Status: DC

## 2012-01-20 MED ORDER — MORPHINE SULFATE 4 MG/ML IJ SOLN
4.0000 mg | Freq: Once | INTRAMUSCULAR | Status: AC
  Administered 2012-01-20: 4 mg via INTRAVENOUS
  Filled 2012-01-20: qty 1

## 2012-01-20 MED ORDER — SODIUM CHLORIDE 0.9 % IV BOLUS (SEPSIS)
1000.0000 mL | Freq: Once | INTRAVENOUS | Status: AC
  Administered 2012-01-20: 1000 mL via INTRAVENOUS

## 2012-01-20 MED ORDER — ENOXAPARIN SODIUM 120 MG/0.8ML ~~LOC~~ SOLN
110.0000 mg | SUBCUTANEOUS | Status: AC
  Administered 2012-01-20: 110 mg via SUBCUTANEOUS
  Filled 2012-01-20: qty 0.8

## 2012-01-20 MED ORDER — ENOXAPARIN SODIUM 80 MG/0.8ML ~~LOC~~ SOLN
110.0000 mg | SUBCUTANEOUS | Status: DC
  Filled 2012-01-20: qty 0.3

## 2012-01-20 MED ORDER — ASPIRIN EC 81 MG PO TBEC
81.0000 mg | DELAYED_RELEASE_TABLET | Freq: Every day | ORAL | Status: DC
  Administered 2012-01-21: 81 mg via ORAL
  Filled 2012-01-20 (×2): qty 1

## 2012-01-20 MED ORDER — MORPHINE SULFATE 2 MG/ML IJ SOLN
1.0000 mg | Freq: Once | INTRAMUSCULAR | Status: AC
  Administered 2012-01-20: 1 mg via INTRAVENOUS
  Filled 2012-01-20: qty 1

## 2012-01-20 MED ORDER — NITROGLYCERIN 0.4 MG SL SUBL: 0.4000 mg | SUBLINGUAL_TABLET | SUBLINGUAL | Status: DC | PRN

## 2012-01-20 MED ORDER — ONDANSETRON HCL 4 MG/2ML IJ SOLN
4.0000 mg | Freq: Once | INTRAMUSCULAR | Status: AC
  Administered 2012-01-20: 4 mg via INTRAVENOUS
  Filled 2012-01-20: qty 2

## 2012-01-20 MED ORDER — ENOXAPARIN SODIUM 120 MG/0.8ML ~~LOC~~ SOLN
110.0000 mg | Freq: Two times a day (BID) | SUBCUTANEOUS | Status: DC
  Administered 2012-01-20 – 2012-01-21 (×3): 110 mg via SUBCUTANEOUS
  Filled 2012-01-20 (×5): qty 0.8

## 2012-01-20 MED ORDER — IOHEXOL 350 MG/ML SOLN
80.0000 mL | Freq: Once | INTRAVENOUS | Status: AC | PRN
  Administered 2012-01-20: 80 mL via INTRAVENOUS

## 2012-01-20 MED ORDER — SODIUM CHLORIDE 0.9 % IJ SOLN
3.0000 mL | Freq: Two times a day (BID) | INTRAMUSCULAR | Status: DC
  Administered 2012-01-20 – 2012-01-21 (×2): 3 mL via INTRAVENOUS

## 2012-01-20 MED ORDER — TRAMADOL-ACETAMINOPHEN 37.5-325 MG PO TABS
1.0000 | ORAL_TABLET | ORAL | Status: DC | PRN
  Administered 2012-01-20 (×3): 2 via ORAL
  Filled 2012-01-20 (×3): qty 2

## 2012-01-20 NOTE — H&P (Signed)
Internal Medicine Teaching Service Attending Note Date: 01/20/2012  Patient name: [name]  Medical record number: [id number]  Date of birth: 14-Mar-1976   I have seen and evaluated [name] and discussed their care with the Residency Team.    [name] is a 36yo man who presented today after an episode of Syncope.  [name] reports that this morning, as he was getting out of a friend's car, he walked approx 20 feet when he suddenly developed SOB and tachycardia which lasted a few seconds.  He was concerned about passing out, so he sat on the ground.  He then blacked out for a few minutes and awoke with a sharp headache, bitemporal which moved behind his eyes and had associated photophobia.  Further associated symptoms included ringing in his ears, nausea and vomiting.  He did not have any seizure like activity that he is aware of and did not have a post-ictal event after awaking.  Further symptoms include pleuritic substernal chest pain.  He denied ETOH or illicit drugs.   [name] was recently hospitalized at the end of October for a DVT and PE.  He has history of + lupus anticoagulant and previous clots.  He was started on Lovenox and coumadin at last evaluation, however, he continues to be subtherapeutic on his INR.  He also has a history of syncopal episodes in 2010 which were thought to be due to cocaine use, however, he denies cocaine use now.   He specifically denies losing bowel or bladder control, fever, chills, recent illness, abdominal pain, change in vision, jerking/weakness of extremities.    Further ROS is reported in Resident note.   Physical Exam: Blood pressure 134/80, pulse 79, temperature 98.2 F (36.8 C), temperature source Oral, resp. rate 18, height 6\' 4"  (1.93 m), weight 239 lb 13.8 oz (108.8 kg), SpO2 95.00%. General appearance: alert, cooperative and no distress Head: Normocephalic, without obvious abnormality, atraumatic Eyes: EOMI, no icterus Neck: supple, no  stiffness.  Lungs: clear to auscultation bilaterally and good effort Heart: regular rate and rhythm, S1, S2 normal, no murmur Abdomen: soft, non-tender; bowel sounds normal; no masses,  no organomegaly Extremities: no edema or trauma Skin: no rash/lesion Neurologic: Grossly normal, CN 2-12 grossly intact, motor strength equal bilaterally, no sensory deficits  Lab results: Results for orders placed during the hospital encounter of 01/20/12 (from the past 24 hour(s))  APTT     Status: Normal   Collection Time   01/20/12  5:50 AM      Component Value Range   aPTT 30  24 - 37 seconds  PROTIME-INR     Status: Normal   Collection Time   01/20/12  5:50 AM      Component Value Range   Prothrombin Time 14.9  11.6 - 15.2 seconds   INR 1.19  0.00 - 1.49  CBC     Status: Abnormal   Collection Time   01/20/12  5:50 AM      Component Value Range   WBC 12.3 (*) 4.0 - 10.5 K/uL   RBC 4.93  4.22 - 5.81 MIL/uL   Hemoglobin 14.4  13.0 - 17.0 g/dL   HCT 57.8  46.9 - 62.9 %   MCV 85.8  78.0 - 100.0 fL   MCH 29.2  26.0 - 34.0 pg   MCHC 34.0  30.0 - 36.0 g/dL   RDW 52.8  41.3 - 24.4 %   Platelets 173  150 - 400 K/uL  COMPREHENSIVE  METABOLIC PANEL     Status: Abnormal   Collection Time   01/20/12  5:50 AM      Component Value Range   Sodium 138  135 - 145 mEq/L   Potassium 3.7  3.5 - 5.1 mEq/L   Chloride 101  96 - 112 mEq/L   CO2 27  19 - 32 mEq/L   Glucose, Bld 109 (*) 70 - 99 mg/dL   BUN 9  6 - 23 mg/dL   Creatinine, Ser 1.61  0.50 - 1.35 mg/dL   Calcium 9.4  8.4 - 09.6 mg/dL   Total Protein 6.8  6.0 - 8.3 g/dL   Albumin 3.5  3.5 - 5.2 g/dL   AST 37  0 - 37 U/L   ALT 98 (*) 0 - 53 U/L   Alkaline Phosphatase 66  39 - 117 U/L   Total Bilirubin 0.4  0.3 - 1.2 mg/dL   GFR calc non Af Amer 70 (*) >90 mL/min   GFR calc Af Amer 82 (*) >90 mL/min  TROPONIN I     Status: Normal   Collection Time   01/20/12  5:50 AM      Component Value Range   Troponin I <0.30  <0.30 ng/mL  DIFFERENTIAL      Status: Abnormal   Collection Time   01/20/12  5:50 AM      Component Value Range   Neutrophils Relative 77  43 - 77 %   Neutro Abs 9.4 (*) 1.7 - 7.7 K/uL   Lymphocytes Relative 12  12 - 46 %   Lymphs Abs 1.4  0.7 - 4.0 K/uL   Monocytes Relative 10  3 - 12 %   Monocytes Absolute 1.2 (*) 0.1 - 1.0 K/uL   Eosinophils Relative 1  0 - 5 %   Eosinophils Absolute 0.1  0.0 - 0.7 K/uL   Basophils Relative 0  0 - 1 %   Basophils Absolute 0.0  0.0 - 0.1 K/uL  URINE RAPID DRUG SCREEN (HOSP PERFORMED)     Status: Abnormal   Collection Time   01/20/12  9:38 AM      Component Value Range   Opiates POSITIVE (*) NONE DETECTED   Cocaine POSITIVE (*) NONE DETECTED   Benzodiazepines NONE DETECTED  NONE DETECTED   Amphetamines NONE DETECTED  NONE DETECTED   Tetrahydrocannabinol NONE DETECTED  NONE DETECTED   Barbiturates NONE DETECTED  NONE DETECTED  CK TOTAL AND CKMB     Status: Normal   Collection Time   01/20/12 11:10 AM      Component Value Range   Total CK 122  7 - 232 U/L   CK, MB 2.3  0.3 - 4.0 ng/mL   Relative Index 1.9  0.0 - 2.5  TROPONIN I     Status: Normal   Collection Time   01/20/12 11:10 AM      Component Value Range   Troponin I <0.30  <0.30 ng/mL    Imaging results:  Ct Angio Chest Pe W/cm &/or Wo Cm  01/20/2012  *RADIOLOGY REPORT*  Clinical Data: chest pain, syncope.  history of pulmonary embolism and deep venous thrombosis.  CT ANGIOGRAPHY CHEST  Technique:  Multidetector CT imaging of the chest using the standard protocol during bolus administration of intravenous contrast. Multiplanar reconstructed images including MIPs were obtained and reviewed to evaluate the vascular anatomy.  Contrast: 80mL OMNIPAQUE IOHEXOL 350 MG/ML SOLN  Comparison: Plain film of earlier in the day.  CT of 08/10/2011.  Findings: Lung windows demonstrate mild motion degradation.  Patchy areas of atelectasis bilaterally.  A probable subpleural lymph node on the right major fissure 3 mm on image 35/series  5.  Soft tissue windows:  The quality of this exam for evaluation of pulmonary embolism is moderate.  There is a large amount of contrast remaining in the SVC.  A filling defect is identified within a right upper lobe segmental to sub segmental branch, including on image 110/series 6.  The overall clot burden is decreased since the prior exam.  No other definite pulmonary emboli are identified.  A focus of relative hypoenhancement within the lingula on image 149/series 6 could be due to mixing phenomenon.  Normal aortic caliber.  No evidence of right heart strain. Normal heart size without pericardial or pleural effusion.  No mediastinal or hilar adenopathy.  Limited abdominal imaging demonstrates no significant findings.  No acute osseous abnormality.  IMPRESSION: Moderate quality exam for evaluation of pulmonary embolism, with a large amount contrast remaining in the SVC.  A filling defect within the right upper lobe is highly suspicious for a small volume embolus; decreased since 08/10/2011.  An area in the lingula could be due to mixing phenomenon or small volume embolus. I personally called and discussed this report with Dr. Preston Fleeting  at 8:29 a.m. on 01/20/2012.   Original Report Authenticated By: Jeronimo Greaves, M.D.    Dg Chest Portable 1 View  01/20/2012  *RADIOLOGY REPORT*  Clinical Data: Chest pain.  PORTABLE CHEST - 1 VIEW  Comparison: PA and lateral chest 08/11/2011.  Findings: Lungs are clear.  Heart size is normal.  No pneumothorax or pleural fluid.  IMPRESSION: Negative chest.   Original Report Authenticated By: Holley Dexter, M.D.     Assessment and Plan: I agree with the formulated Assessment and Plan with the following changes:   Syncope Likely causes include vasovagal syncope, cardiogenic or neurogenic.  Because of prodrome, vasovagal is most likely, though palpitations may point to a cardiac source.  Since this appears to be an ongoing issue, further evaluation is warranted.   - Admit to  telemetry bed - CE X 3 - first negative - Check UDS as previous cocaine use was a factor in syncope - TTE to evaluate for structural abnormalities - Orthostatics - +/- on EEG, symptoms do not seem to point to a seizure having caused his symptoms - If no overt cause found, would consider outpatient event monitor as a clear prodrome was described  PE/DVT with subtherapeutic INR - Continue lovenox and coumadin, INR should be between 2-3  Other issues per resident note.   Inez Catalina, MD 11/1/20134:31 PM

## 2012-01-20 NOTE — Discharge Summary (Signed)
Please note that [name]' lupus anticoagulant is positive and may be the explanation for his recurrent venous thromboembolism.  He requires lifelong anticoagulation given his recurrent disease.  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

## 2012-01-20 NOTE — ED Notes (Signed)
Per EMS: [facility] from [facility] with c/o n/v with shortness of breath and sharp pain in his chest on inspiration. No radiation. EKG unremarkable. Pt hx of blood clots in legs as of 2011. Pt is A&Ox4, skin is warm and dry, respirations are equal and unlabored. Pain 8/10. 20g IV in left hand

## 2012-01-20 NOTE — Progress Notes (Signed)
Routine EEG completed.  

## 2012-01-20 NOTE — Progress Notes (Signed)
Pt continues to complain of headache which he rates an 8/10 also complains of chest pain which he rates a 5/10. EKG obtained. MD made aware and came up to assess pt. New order received for one time dose of Morphine 1 mg. Will continue to monitor pt.

## 2012-01-20 NOTE — Progress Notes (Signed)
Utilization review completed.  

## 2012-01-20 NOTE — Procedures (Signed)
History: 36 yo M with two episodes of sybncope  Sedation: None  Background: There is a well defined posterior dominant rhythm of 9 Hz that attenuates with eye opening. Sleep was recorded with bilateral symmetric k-complexes.   Photic stimulation: Physiologic driving is present  EEG Diagnosis: Normal EEG  Clinical Interpretation: This normal EEG is recorded in the waking and sleep state. There was no seizure or seizure predisposition recorded on this study.   Ritta Slot, MD [facility] [phone]  If 7pm- 7am, please page neurology on call at [phone].

## 2012-01-20 NOTE — H&P (Signed)
Date: 01/20/2012               Patient Name:  [name] MRN: [id number]  DOB: 02/10/1976 Age / Sex: 36 y.o., male   PCP: SAWHNEY,MEGHA              Medical Service: Internal Medicine Teaching Service              Attending Physician: Dr. Debe Coder    First Contact: Dr. Garald Braver Pager: [phone]  Second Contact: Dr. Clyde Lundborg Pager: [phone]            After Hours (After 5p/  First Contact Pager: [phone]  weekends / holidays): Second Contact Pager: [phone]     Chief Complaint: Syncope  History of Present Illness: Patient is a 36 y.o. male with a PMHx of multiple PE, lupus anticoagulant (+), family history of early MI who presents to [facility] for evaluation of an unwitnessed syncopal episode that occured at 4:00AM on the morning of admission. The patient states that he walked out of his friend's car and walked approximately 20 feet, after which he began to develop sudden shortness of breath with associated tachycardia, lasting for a few 5-10 seconds. He felt this to be a prodrome for a syncopal event, and therefore himself down to the ground to avoid injury. He subsequently completely blacked out for approximately 3-4 minutes, and then awoke with a sharp, bitemporal headache that also extends behind his eyes with associated photophobia. He also experienced tinnitus and nausea with one episode of vomiting. States he was able to recognize his surroundings and understood what just transpired immediately upon awakening. He notably did not sustain fecal or urinary incontinence or tongue biting during the episode. States that at the time of evaluation, he continues to have severe headache and photophobia and pleuritic chest pain.  Although the patient was at a Halloween party on the evening prior to admission, he denies use of alcohol or illicit drugs.  Of note, the patient was recently hospitalized from 10/28-10/29/2103 for recurrent DVT after being off of coumadin x 4 weeks. On presentation, he  also presented with syncopal event after getting out of his shower. The event was thought to be secondary to his known PE. Since hospital discharge, the patient has otherwise been doing well, without fevers, chills, cough, chest pain, congestion, abdominal pain, vision changes, vision loss, or headaches. He also states although he has previously experienced syncopal episodes in 2010, they were in the context of cocaine abuse, which he currently does not endorse. He played sports as a child and never had syncopal events after exertion.    Review of Systems: Constitutional:  denies fever, chills, diaphoresis, appetite change and fatigue.  HEENT: admits to photophobia. Denies eye pain, redness, hearing loss, ear pain, congestion, sore throat, rhinorrhea, sneezing, neck pain, neck stiffness and tinnitus.  Respiratory: admits to pleuritic CP Denies DOE, cough, chest tightness, and wheezing.  Cardiovascular: admits to pleuritic chest pain. Denies palpitations and leg swelling.  Gastrointestinal: denies nausea, vomiting, abdominal pain, diarrhea, constipation, blood in stool.  Genitourinary: denies dysuria, urgency, frequency, hematuria, flank pain and difficulty urinating.  Musculoskeletal: denies  myalgias, back pain, joint swelling, arthralgias and gait problem.   Skin: denies pallor, rash and wound.  Neurological: denies dizziness, seizures, syncope, weakness, light-headedness, numbness and headaches.   Hematological: denies adenopathy, easy bruising, personal or family bleeding history.  Psychiatric/ Behavioral: denies suicidal ideation, mood changes, confusion, nervousness, sleep disturbance and agitation.    Current  Outpatient Medications: Medications Administered in the ED Medication Dose  . iohexol (OMNIPAQUE) 350 MG/ML injection 80 mL  80 mL  . morphine 4 MG/ML injection 4 mg  4 mg  . morphine 4 MG/ML injection 4 mg  4 mg  . ondansetron (ZOFRAN) injection 4 mg  4 mg  . sodium chloride  0.9 % bolus 1,000 mL  1,000 mL   Current Outpatient Prescriptions Medication Sig  . enoxaparin (LOVENOX) 100 MG/ML injection Inject 1.1 mLs (110 mg total) into the skin every 12 (twelve) hours.  Marland Kitchen warfarin (COUMADIN) 7.5 MG tablet Take 1 tablet (7.5 mg total) by mouth one time only at 6 PM.    Allergies: No Known Allergies   Past Medical History: Past Medical History  Diagnosis Date  . PE (pulmonary embolism) 2011; 07/2011  . Syncope and collapse 01/16/2012    "woke up to dog licking my face" (01/16/2012)  . 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 Cocaine abuse   . Family history of early CAD     Father deceased at 13 yo with MI  . Tobacco abuse     Past Surgical History: Past Surgical History  Procedure Date  . No past surgeries     Family History: Family History  Problem Relation Age of Onset  . Coronary artery disease Father 19    MI at age 89  . Colon cancer Paternal Grandmother   . Cancer Maternal Grandfather     Social History: History   Social History  . Marital Status: Single    Spouse Name: N/A    Number of Children: 0  . Years of Education: college   Occupational History  . Musician     plays piano   Social History Main Topics  . Smoking status: Current Every Day Smoker -- 0.5 packs/day for 5 years    Types: Cigarettes  . Smokeless tobacco: Never Used  . Alcohol Use: Yes     01/16/2012 "1 beer or mixed drink q 2 wk or so"  . Drug Use: Yes    Special: Cocaine     01/16/2012 "last use was probably 1-2 yr ago"  . Sexually Active: Yes   Other Topics Concern  . Not on file   Social History Narrative   Lives in [place] by himself.     Vital Signs: Blood pressure 139/83, temperature 98.5 F (36.9 C), temperature source Oral, resp. rate 23, SpO2 99.00%.  Physical Exam:  General Exam:   Head: Normocephalic, atraumatic.  Eyes: No signs of anemia or jaundince.  Nose: Mucous membranes moist, not inflammed,  nonerythematous.  Throat: Oropharynx nonerythematous, no exudate appreciated.   Neck: No deformities, masses, or tenderness noted. Supple, No carotid Bruits, no JVD.  Lungs:  Normal respiratory effort. Clear to auscultation BL without crackles or wheezes.  Heart: RRR. S1 and S2 normal without gallop, murmur, or rubs.  Abdomen:  BS normoactive. Soft, Nondistended, non-tender.  No masses or organomegaly.  Extremities: No pretibial edema.  Skin: No visible rashes, scars.     Neurologic Exam:   Mental Status: Alert, oriented, thought content appropriate.  Speech fluent without evidence of aphasia. Able to follow 3 step commands without difficulty.  Cranial Nerves:   II: Visual fields grossly intact.  III/IV/VI: Extraocular movements intact.  Pupils reactive bilaterally.  V/VII: Smile symmetric. facial light touch sensation normal bilaterally.  VIII: Grossly intact.  IX/X: Normal gag.  XI: Bilateral shoulder shrug normal.  XII: Midline  tongue extension normal.  Motor:  5/5 bilaterally with normal tone and bulk  Sensory:  Pinprick and light touch intact throughout, bilaterally  DTRs: 2+ and symmetric throughout  Cerebellar: Normal finger-to-nose, normal rapid alternating movements and normal heel-to-shin test.  Normal gait and station.    Lab results: Comprehensive Metabolic Panel:    Component Value Date/Time   NA 138 01/20/2012 0550   K 3.7 01/20/2012 0550   CL 101 01/20/2012 0550   CO2 27 01/20/2012 0550   BUN 9 01/20/2012 0550   CREATININE 1.30 01/20/2012 0550   GLUCOSE 109* 01/20/2012 0550   CALCIUM 9.4 01/20/2012 0550   AST 37 01/20/2012 0550   ALT 98* 01/20/2012 0550   ALKPHOS 66 01/20/2012 0550   BILITOT 0.4 01/20/2012 0550   PROT 6.8 01/20/2012 0550   ALBUMIN 3.5 01/20/2012 0550      CBC:    Component Value Date/Time   WBC 12.3* 01/20/2012 0550   HGB 14.4 01/20/2012 0550   HCT 42.3 01/20/2012 0550   PLT 173 01/20/2012 0550   MCV 85.8 01/20/2012 0550   NEUTROABS 8.5* 01/16/2012  0828   LYMPHSABS 1.8 01/16/2012 0828   MONOABS 1.1* 01/16/2012 0828   EOSABS 0.3 01/16/2012 0828   BASOSABS 0.0 01/16/2012 0828    Lab Results  Component Value Date   INR 1.19 01/20/2012   INR 1.11 01/17/2012   INR 1.07 01/16/2012    Lab Results  Component Value Date   TSH 3.050 08/10/2011     Imaging results:   Ct Angio Chest Pe W/cm &/or Wo Cm (01/20/2012) - Moderate quality exam for evaluation of pulmonary embolism, with a large amount contrast remaining in the SVC.  A filling defect within the right upper lobe is highly suspicious for a small volume embolus; decreased since 08/10/2011.  An area in the lingula could be due to mixing phenomenon or small volume embolus. I personally called and discussed this report with Dr. Preston Fleeting  at 8:29 a.m. on 01/20/2012.   Original Report Authenticated By: Jeronimo Greaves, M.D.    Dg Chest Portable 1 View (01/20/2012) - Negative chest.   Original Report Authenticated By: Holley Dexter, M.D.     Other results:  EKG (01/20/2012) - Normal Sinus Rhythm, regular rate of approximately 90 bpm, normal axis, ST segments: nonspecific ST/T changes.    Assessment & Plan: Pt is a 36 y.o. yo male with a PMHx of chronic PE, (+) lupus anticoagulant, tobacco abuse who was admitted on 01/20/2012 with episode of syncope on the morning of admission, which is of unclear etiology, and continues to be investigated.   1) Syncope - unclear etiology. Patient's prodrome of shortness of breath and tachycardia of ~ 5 seconds immediately preceding episode suggest possible cardiovascular cause (such as arrhythmia or structural defect), also of concern given family history of early MI, personal history of ongoing smoking, and nonspecific ST changes per EKG. Orthostatic versus vasovagal cause is also considered given that both this and prior episode last week occurred after positional change, and after coming out of the shower. Given young age  Although the patient does have a PE,  given small size evidenced on CTA, unlikely this is the cause of recurrent syncopal events. There are otherwise no metabolic derangements to suggest contribution to syncopal event. As well, no neurological deficits to suggest strict neurologic syncope.  Plan: - Will admit to telemetry overnight for observation. - Will consider outpatient event monitor testing (24-hour Holter monitor may be insufficient given symptoms occuring  every few weeks). - Cycle cardiac enzymes, check TSH. - Check UDS (he has previously experienced multiple syncopal episodes in 2010 in setting of cocaine abuse). - Consider EEG to evaluate for seizure activity (given young age) if other source cannot be identified. - Will check 2-D echocardiogram to evaluate for structural abnormalities. - Check orthostatic vital signs  2) Headache - no neurologic deficits noted.  - Treat symptomatically with ultracet.  3) Pulmonary embolism in setting of (+) lupus anticoagulant - subtherapeutic INR at this time. The patient is on day 5 of his Lovenox bridge. Will need to be continued for at least 2 days beyond therapeutic INR.  - Continue full dose Lovenox. - Warfarin per pharmacy  4) Tobacco abuse - patient continues to smoke approximately 1/2 ppd. He was counseled on the dangers of smoking, importance of cessation, and tools that can be use to pursue smoking cessation.  DVT PPX - full dose lovenox and coumadin  Code Status - FULL CODE - this was confirmed with the patient.    Signed: Johnette Abraham, Roma Schanz, Internal Medicine Resident Pager: [phone] (7AM-5PM) 01/20/2012, 9:16 AM

## 2012-01-20 NOTE — Care Management Note (Signed)
    Page 1 of 1   01/20/2012     11:46:18 AM   CARE MANAGEMENT NOTE 01/20/2012  Patient:  [name], [name]   Account Number:  [id number]  Date Initiated:  01/20/2012  Documentation initiated by:  Donn Pierini  Subjective/Objective Assessment:   Pt admitted with syncope hx of DVT and PE     Action/Plan:   PTA pt lived at home   Anticipated DC Date:  01/21/2012   Anticipated DC Plan:  HOME/SELF CARE      DC Planning Services  CM consult      Choice offered to / List presented to:             Status of service:  In process, will continue to follow Medicare Important Message given?   (If response is "NO", the following Medicare IM given date fields will be blank) Date Medicare IM given:   Date Additional Medicare IM given:    Discharge Disposition:    Per UR Regulation:  Reviewed for med. necessity/level of care/duration of stay  If discussed at Long Length of Stay Meetings, dates discussed:    Comments:  01/20/12- 1145- Donn Pierini RN, BSN [phone] Pt has had recent admission for PE/DVT and had assistance with Lovenox medication at discharge. Pt is followed by the [facility] outpt clinics-PCP- SAWHNEY, MEGHA-   NCM to follow for potential d/c needs.

## 2012-01-20 NOTE — ED Notes (Signed)
Pt remains in CT, will given pain medication when pt returns.

## 2012-01-20 NOTE — Progress Notes (Signed)
Pt complained of chest pain which he rated at a 5/10 and compared to chest pain he experienced on admission and also complained of a headache which he rated a 8/10 on a 0-10 pain scale. MD on call made aware of patient's condition. MD ordered a troponin level be drawn. No new orders for pain. Will continue to monitor pt.

## 2012-01-20 NOTE — ED Provider Notes (Signed)
History     CSN: [id number]  Arrival date & time 01/20/12  0440   First MD Initiated Contact with Patient 01/20/12 [phone]      Chief Complaint  Patient presents with  . Chest Pain    on inspiration  . Nausea    (Consider location/radiation/quality/duration/timing/severity/associated sxs/prior treatment) Patient is a 36 y.o. male presenting with chest pain. The history is provided by the patient.  Chest Pain   He was at a Halloween party and as she was leaving he had sudden onset of lightheadedness and passed out. He is not sure how long he was passed out. When he woke up, he had nausea and vomiting. He also noted sharp anterior chest pain which was worse with deep breathing. There was no diaphoresis. He continues to have nausea and chest pain but no further dizziness or lightheadedness. Of note, he is currently on Lovenox and warfarin to treat DVT with possible pulmonary embolism and has history of pulmonary embolism in the past. Pain is moderately severe and he rates it 8/10. It is worse with deep breath, nothing makes it better. In spite of the chest pain, he denies dyspnea.  Past Medical History  Diagnosis Date  . PE (pulmonary embolism) 2011; 07/2011  . Syncope and collapse 01/16/2012    "woke up to dog licking my face" (01/16/2012)  . DVT of leg (deep venous thrombosis) 01/16/2012    right    Past Surgical History  Procedure Date  . No past surgeries     History reviewed. No pertinent family history.  History  Substance Use Topics  . Smoking status: Current Every Day Smoker -- 0.5 packs/day for 5 years    Types: Cigarettes  . Smokeless tobacco: Never Used  . Alcohol Use: Yes     01/16/2012 "1 beer or mixed drink q 2 wk or so"      Review of Systems  Cardiovascular: Positive for chest pain.  All other systems reviewed and are negative.    Allergies  Review of patient's allergies indicates no known allergies.  Home Medications   Current Outpatient Rx    Name Route Sig Dispense Refill  . ENOXAPARIN SODIUM 100 MG/ML [place] SOLN Subcutaneous Inject 1.1 mLs (110 mg total) into the skin every 12 (twelve) hours. 8 Syringe 0    Patient was given the supply of lovenox from the h ...  . WARFARIN SODIUM 7.5 MG PO TABS Oral Take 1 tablet (7.5 mg total) by mouth one time only at 6 PM. 5 tablet 0    BP 139/83  Temp 98.5 F (36.9 C) (Oral)  Resp 23  SpO2 99%  Physical Exam  Nursing note and vitals reviewed. 36 year old male, resting comfortably and in no acute distress. Vital signs are significant for tachypnea with respiratory rate of 23. Oxygen saturation is 99%, which is normal. Head is normocephalic and atraumatic. PERRLA, EOMI. Oropharynx is clear. Fundi show no hemorrhage, exudate, or papilledema. Neck is nontender and supple without adenopathy or JVD. There are no carotid bruits. Back is nontender and there is no CVA tenderness. Lungs are clear without rales, wheezes, or rhonchi. Chest is nontender. Heart has regular rate and rhythm without murmur. Abdomen is soft, flat, nontender without masses or hepatosplenomegaly and peristalsis is normoactive. Extremities have no cyanosis or edema, full range of motion is present. Skin is warm and dry without rash. Neurologic: Mental status is normal, cranial nerves are intact, there are no motor or sensory deficits.  ED Course  Procedures (including critical care time)  Results for orders placed during the hospital encounter of 01/20/12  APTT      Component Value Range   aPTT 30  24 - 37 seconds  PROTIME-INR      Component Value Range   Prothrombin Time 14.9  11.6 - 15.2 seconds   INR 1.19  0.00 - 1.49  CBC      Component Value Range   WBC 12.3 (*) 4.0 - 10.5 K/uL   RBC 4.93  4.22 - 5.81 MIL/uL   Hemoglobin 14.4  13.0 - 17.0 g/dL   HCT 78.4  69.6 - 29.5 %   MCV 85.8  78.0 - 100.0 fL   MCH 29.2  26.0 - 34.0 pg   MCHC 34.0  30.0 - 36.0 g/dL   RDW 28.4  13.2 - 44.0 %   Platelets 173  150 -  400 K/uL  COMPREHENSIVE METABOLIC PANEL      Component Value Range   Sodium 138  135 - 145 mEq/L   Potassium 3.7  3.5 - 5.1 mEq/L   Chloride 101  96 - 112 mEq/L   CO2 27  19 - 32 mEq/L   Glucose, Bld 109 (*) 70 - 99 mg/dL   BUN 9  6 - 23 mg/dL   Creatinine, Ser 1.02  0.50 - 1.35 mg/dL   Calcium 9.4  8.4 - 72.5 mg/dL   Total Protein 6.8  6.0 - 8.3 g/dL   Albumin 3.5  3.5 - 5.2 g/dL   AST 37  0 - 37 U/L   ALT 98 (*) 0 - 53 U/L   Alkaline Phosphatase 66  39 - 117 U/L   Total Bilirubin 0.4  0.3 - 1.2 mg/dL   GFR calc non Af Amer 70 (*) >90 mL/min   GFR calc Af Amer 82 (*) >90 mL/min  TROPONIN I      Component Value Range   Troponin I <0.30  <0.30 ng/mL   Ct Angio Chest Pe W/cm &/or Wo Cm  01/20/2012  *RADIOLOGY REPORT*  Clinical Data: chest pain, syncope.  history of pulmonary embolism and deep venous thrombosis.  CT ANGIOGRAPHY CHEST  Technique:  Multidetector CT imaging of the chest using the standard protocol during bolus administration of intravenous contrast. Multiplanar reconstructed images including MIPs were obtained and reviewed to evaluate the vascular anatomy.  Contrast: 80mL OMNIPAQUE IOHEXOL 350 MG/ML SOLN  Comparison: Plain film of earlier in the day.  CT of 08/10/2011.  Findings: Lung windows demonstrate mild motion degradation.  Patchy areas of atelectasis bilaterally.  A probable subpleural lymph node on the right major fissure 3 mm on image 35/series 5.  Soft tissue windows:  The quality of this exam for evaluation of pulmonary embolism is moderate.  There is a large amount of contrast remaining in the SVC.  A filling defect is identified within a right upper lobe segmental to sub segmental branch, including on image 110/series 6.  The overall clot burden is decreased since the prior exam.  No other definite pulmonary emboli are identified.  A focus of relative hypoenhancement within the lingula on image 149/series 6 could be due to mixing phenomenon.  Normal aortic caliber.  No  evidence of right heart strain. Normal heart size without pericardial or pleural effusion.  No mediastinal or hilar adenopathy.  Limited abdominal imaging demonstrates no significant findings.  No acute osseous abnormality.  IMPRESSION: Moderate quality exam for evaluation of pulmonary embolism, with a large  amount contrast remaining in the SVC.  A filling defect within the right upper lobe is highly suspicious for a small volume embolus; decreased since 08/10/2011.  An area in the lingula could be due to mixing phenomenon or small volume embolus. I personally called and discussed this report with Dr. Preston Fleeting  at 8:29 a.m. on 01/20/2012.   Original Report Authenticated By: Jeronimo Greaves, M.D.    Dg Chest Portable 1 View  01/20/2012  *RADIOLOGY REPORT*  Clinical Data: Chest pain.  PORTABLE CHEST - 1 VIEW  Comparison: PA and lateral chest 08/11/2011.  Findings: Lungs are clear.  Heart size is normal.  No pneumothorax or pleural fluid.  IMPRESSION: Negative chest.   Original Report Authenticated By: Holley Dexter, M.D.    Images viewed by me, discussed with radiologist.   Date: 01/20/2012  Rate: 91  Rhythm: normal sinus rhythm  QRS Axis: normal  Intervals: normal  ST/T Wave abnormalities: Mild J-point elevation in leads V2 and V3  Conduction Disutrbances:none  Narrative Interpretation: Mild J-point elevation in leads V2 and V3 which is probably early repolarization, mild PR depression in the inferior leads and anterolateral leads. When compared with ECG of 01/08/2012, no significant changes are seen.  Old EKG Reviewed: unchanged   1. Syncope   2. Pulmonary embolism       MDM  Syncope with pleuritic chest pain in a patient with history of pulmonary embolism. Old records are reviewed, and he actually was admitted for syncope which was felt to be likely due to pulmonary embolism. This occurred 4 days ago and he was sent out with Lovenox bridging treatment and warfarin. He does have 2 documented  episodes of pulmonary embolism. At the time of his hospitalization, he had a positive Doppler for DVT but did not have a CT angiogram done. Because of recurrent syncope while he is extensively being treated for her DVT and PE, a CT angiogram will be obtained. I do expect it to be positive, but I am interested in determining the clot burden to see if he might be a candidate for thrombolytic therapy or more aggressive anticoagulation.  CT scan does not demonstrate significant clot burden. He will need to be admitted for further evaluation of his syncope.      Dione Booze, MD 01/20/12 [phone]

## 2012-01-20 NOTE — Progress Notes (Signed)
Pt is currently resting in bed. Pt reports that pain is gradually decreasing. Will continue to monitor pt.

## 2012-01-20 NOTE — ED Notes (Signed)
Patient transported to CT 

## 2012-01-20 NOTE — Progress Notes (Addendum)
ANTICOAGULATION CONSULT NOTE - Initial Consult  Pharmacy Consult for Coumadin Indication: DVT, recent h/o PE  No Known Allergies  Patient Measurements: Height: 6\' 4"  (193 cm) Weight: 239 lb 13.8 oz (108.8 kg) IBW/kg (Calculated) : 86.8  Heparin Dosing Weight:   Vital Signs: Temp: 98.2 F (36.8 C) (11/01 1422) Temp src: Oral (11/01 1422) BP: 134/80 mmHg (11/01 1422) Pulse Rate: 79  (11/01 1422)  Labs:  Basename 01/20/12 1110 01/20/12 0550  HGB -- 14.4  HCT -- 42.3  PLT -- 173  APTT -- 30  LABPROT -- 14.9  INR -- 1.19  HEPARINUNFRC -- --  CREATININE -- 1.30  CKTOTAL 122 --  CKMB 2.3 --  TROPONINI <0.30 <0.30    Estimated Creatinine Clearance: 108.3 ml/min (by C-G formula based on Cr of 1.3).   Medical History: Past Medical History  Diagnosis Date  . PE (pulmonary embolism) 2011; 07/2011  . Syncope and collapse 01/16/2012    "woke up to dog licking my face" (01/16/2012)  . 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 Cocaine abuse   . Family history of early CAD     Father deceased at 65 yo with MI  . Tobacco abuse     Medications:  Lovenox 110mg  SQ q12h  Assessment: 36yom on Lovenox bridging to Coumadin for DVT and recent h/o PE. Patient was discharged 10/29 and and now presents with syncope and subtherapeutic INR (1.19). Patient states that he did not take any of his Coumadin after discharge - will have to restart anticoagulation overlap (Day 1 of minimum 5). Patient had previously been on Coumadin and was managed by Dr. Alexandria Lodge - last therapeutic regimen was reported as Coumadin 5mg  daily except 7.5mg  on Mondays and Thursdays. - H/H and Plts wnl - No significant bleeding reported - Wt: 109kg, CrCl >100 ml/min  Goal of Therapy:  INR 2-3 Anti-Xa level 0.6-1.2 units/ml 4hrs after LMWH dose given Monitor platelets by anticoagulation protocol: Yes   Plan:  1. Coumadin 10mg  po x 1 today 2. Continue Lovenox 110mg  SQ q12h 3. Daily INR 4.  Discussed with patient the importance of obtaining and taking Coumadin daily  Cleon Dew [phone] 01/20/2012,2:34 PM

## 2012-01-20 NOTE — ED Notes (Signed)
Admission MD at bedside.  

## 2012-01-21 LAB — COMPREHENSIVE METABOLIC PANEL
AST: 96 U/L — ABNORMAL HIGH (ref 0–37)
Alkaline Phosphatase: 64 U/L (ref 39–117)
BUN: 13 mg/dL (ref 6–23)
CO2: 26 mEq/L (ref 19–32)
Chloride: 105 mEq/L (ref 96–112)
Creatinine, Ser: 1.26 mg/dL (ref 0.50–1.35)
GFR calc non Af Amer: 73 mL/min — ABNORMAL LOW (ref >90)
Potassium: 4.3 mEq/L (ref 3.5–5.1)
Total Bilirubin: 0.2 mg/dL — ABNORMAL LOW (ref 0.3–1.2)

## 2012-01-21 LAB — HEPATIC FUNCTION PANEL
ALT: 192 U/L — ABNORMAL HIGH (ref 0–53)
Alkaline Phosphatase: 73 U/L (ref 39–117)
Bilirubin, Direct: <0.1 mg/dL (ref 0.0–0.3)
Total Protein: 6.4 g/dL (ref 6.0–8.3)

## 2012-01-21 LAB — BASIC METABOLIC PANEL
CO2: 29 mEq/L (ref 19–32)
Calcium: 9.1 mg/dL (ref 8.4–10.5)
Creatinine, Ser: 1.4 mg/dL — ABNORMAL HIGH (ref 0.50–1.35)
GFR calc Af Amer: 75 mL/min — ABNORMAL LOW (ref >90)
GFR calc non Af Amer: 64 mL/min — ABNORMAL LOW (ref >90)
Sodium: 141 mEq/L (ref 135–145)

## 2012-01-21 LAB — CBC
MCH: 29 pg (ref 26.0–34.0)
MCV: 85 fL (ref 78.0–100.0)
Platelets: 158 10*3/uL (ref 150–400)
RBC: 4.72 MIL/uL (ref 4.22–5.81)
RDW: 13.7 % (ref 11.5–15.5)
WBC: 5.4 10*3/uL (ref 4.0–10.5)

## 2012-01-21 LAB — CK TOTAL AND CKMB (NOT AT ARMC): Relative Index: 2.1 (ref 0.0–2.5)

## 2012-01-21 LAB — TROPONIN I: Troponin I: <0.3 ng/mL (ref <0.30)

## 2012-01-21 LAB — PROTIME-INR: Prothrombin Time: 15.9 seconds — ABNORMAL HIGH (ref 11.6–15.2)

## 2012-01-21 MED ORDER — TRAMADOL HCL 50 MG PO TABS: 50.0000 mg | ORAL_TABLET | Freq: Four times a day (QID) | ORAL | Status: DC | PRN

## 2012-01-21 MED ORDER — WARFARIN SODIUM 7.5 MG PO TABS
7.5000 mg | ORAL_TABLET | Freq: Once | ORAL | Status: AC
  Administered 2012-01-21: 7.5 mg via ORAL
  Filled 2012-01-21 (×2): qty 1

## 2012-01-21 NOTE — Progress Notes (Signed)
  Echocardiogram 2D Echocardiogram has been performed.  [name] 01/21/2012, 9:35 AM

## 2012-01-21 NOTE — Progress Notes (Signed)
INTERNAL MEDICINE TEACHING SERVICE - NIGHT FLOAT   Called by RN, [name] complaining of continued headache and chest pain unchanged from admission.  Went to evaluate with resident. [name] was sleeping when we entered.  He claims he continues to have sharp chest pain with deep inspiration, non radiating, with no alleviating factors.  No complaints of shortness of breath, nausea, vomiting, or abdominal pain.  He claims morphine in the emergency helped the pain.  EKG obtained showing HR 66bpm with ST elevations in II, V3, V5, V6, changes from prior ekg. Tele monitoring did not show any acute changes. Hx of cocaine abuse.  Plan: Will continue to cycle CE. x4 negative at this time. AM EKG Morphine 1mg  IV x1 ASA Nitroglycerin SL PRN Consider labetalol and/or CCB  Darden Palmer 01/21/12 1236am

## 2012-01-21 NOTE — Progress Notes (Signed)
Medical Student Daily Progress Note  Subjective: Patient complained about HA and CP worse with breathing. Received morphine x1. Continue to have pleuritic CP. Denied HA, dizziness, SOB, palpitation.    Objective: Vital signs in last 24 hours: Filed Vitals:   01/20/12 2100 01/21/12 0500 01/21/12 0503 01/21/12 0506  BP: 129/77 126/83 124/85 128/89  Pulse: 77 55 62 68  Temp: 97.8 F (36.6 C) 97.4 F (36.3 C)    TempSrc:      Resp: 16 16    Height:      Weight:  109.3 kg (240 lb 15.4 oz)    SpO2: 97% 97%     Weight change:   Intake/Output Summary (Last 24 hours) at 01/21/12 1239 Last data filed at 01/21/12 0900  Gross per 24 hour  Intake    600 ml  Output      0 ml  Net    600 ml   Physical Exam: General appearance: awake, cooperative, appears stated age and lying in bed with no appearance distress  Throat: lips, mucosa, and tongue normal; teeth and gums normal  Lungs: normal breath sounds bilaterally without audible crackles, rhales, or wheezes  Chest wall: no tenderness, no heaves  Heart: regular rate and rhythm, S1, S2 normal, no murmur, click, rub or gallop and no prominant S2  Abdomen: soft, non-tender; bowel sounds normal; no masses, no organomegaly  Extremities: extremities normal, atraumatic, no cyanosis or edema and right leg and foot without erythema or edema, equal temperature compared to the left leg. + Homens sign on the right  Pulses: 2+ and symmetric  Skin: Skin color, texture, turgor normal. No rashes or lesions  Neurologic: AAOx3, able to move all extremities, normal strength, and no noticeable sensory deficit.   Lab Results: Basic Metabolic Panel:  Lab 01/21/12 4401 01/20/12 0550  NA 141 138  K 4.3 3.7  CL 105 101  CO2 26 27  GLUCOSE 93 109*  BUN 13 9  CREATININE 1.26 1.30  CALCIUM 9.1 9.4  MG -- --  PHOS -- --   Liver Function Tests:  Lab 01/21/12 0525 01/20/12 0550  AST 96* 37  ALT 156* 98*  ALKPHOS 64 66  BILITOT 0.2* 0.4  PROT 6.2 6.8    ALBUMIN 3.2* 3.5   CBC:  Lab 01/21/12 0525 01/20/12 0550 01/16/12 0828  WBC 5.4 12.3* --  NEUTROABS -- 9.4* 8.5*  HGB 13.7 14.4 --  HCT 40.1 42.3 --  MCV 85.0 85.8 --  PLT 158 173 --   Cardiac Enzymes:  Lab 01/21/12 0528 01/20/12 2345 01/20/12 1749 01/20/12 1110  CKTOTAL -- 125 128 122  CKMB -- 2.6 2.5 2.3  CKMBINDEX -- -- -- --  TROPONINI <0.30 <0.30 <0.30 --   Coagulation:  Lab 01/21/12 0525 01/20/12 0550 01/17/12 0642 01/16/12 1815  LABPROT 15.9* 14.9 14.2 13.8  INR 1.30 1.19 1.11 1.07   Urine Drug Screen: Drugs of Abuse     Component Value Date/Time   LABOPIA POSITIVE* 01/20/2012 0938   COCAINSCRNUR POSITIVE* 01/20/2012 0938   LABBENZ NONE DETECTED 01/20/2012 0938   AMPHETMU NONE DETECTED 01/20/2012 0938   THCU NONE DETECTED 01/20/2012 0938   LABBARB NONE DETECTED 01/20/2012 0938     Medications: I have reviewed the patient's current medications. Scheduled Meds:   . aspirin EC  81 mg Oral Daily  . enoxaparin (LOVENOX) injection  110 mg Subcutaneous NOW  . enoxaparin (LOVENOX) injection  110 mg Subcutaneous Q12H  .  morphine injection  1 mg  Intravenous Once  . sodium chloride  3 mL Intravenous Q12H  . warfarin  10 mg Oral ONCE-1800  . Warfarin - Pharmacist Dosing Inpatient   Does not apply q1800  . DISCONTD: enoxaparin  110 mg Subcutaneous NOW  . DISCONTD: enoxaparin (LOVENOX) injection  110 mg Subcutaneous Q12H   Continuous Infusions:  PRN Meds:.nitroGLYCERIN, traMADol-acetaminophen Assessment/Plan: Principal Problem:  *Syncope Active Problems:  TOBACCO USER  DVT (deep venous thrombosis)  Pulmonary embolism  Pt is a 36 yo man with a PMHx of recurrent PE, (+) lupus anticoagulant, cocaine and tobacco abuse who presented to the hospital after syncope.   # Syncope: patient experienced sudden onset of SOB and palpitation for a few seconds before syncopal event, which happened after he stepped out of a car at 4am after a Halloween party. He walked for about  44ft immediately before experiencing prodromal symptoms. His syncope was not witnessed. He reported LOC for 3~4 min without fecal or urinary incontinent. He also experienced bitemporal HA and photophobia when he woke up.  Though patient denied alcohol or drug use, his UDS showed positive for cocaine and opioit. Syncope is most likely cocaine induced secondary to tachycardia and vasovagal effects. No metabolic derangements to suggest contribution to syncopal event. As well, no neurological deficits to suggest strict neurologic syncope. DDx also include seizure, cardiac issues, and PE. EEG did not show signs of seizure or seizure predispositions.  Patient had normal orthostatic VS. CTA showed 1~2 small area of infarction.  EKG showed nonspecific ST changes initially. In the evening of admission, patient complained about CP worse with breathing. EKG showed ST elevation in Lead II, V3, V5, and V6.  CE remained negative. Pericarditis is a concern.     -2D echo pending -continue tele -continue to manage symptomatically  -will start NSAIDs when Cr normalizes  # Elevated Creatinine: unclear etiology at this time. Cr 1.30 on admission. Cr has been elevated in the past, most recently on 08/10/2011 to 1.40.  It is unclear at this time the cause of the elevated Cr. AKI and DKA are both possibilities. Cr is slightly decreased to 1.26  today. -continue to trend Cr -encourage fluid intake -U/A pending -will start NSAIDs when Cr normalizes  # Elevated LFT: AST 37 and ALT 98 on admission. Both elevated today: AST: 96, ALT 156. Unknown etiology at this time. Will get more history on patient's cocaine use to assess risk factors for hepatitis. Patient received a total of 6 tablets of ultracet yesterday for pain.  - Hep penal pending - will hold ultracet for now  # Headache: no neurologic deficits noted.  - Treat symptomatically with ultracet.   # Pulmonary embolism in setting of (+) lupus anticoagulant -  subtherapeutic INR at this time. The patient is on day 5 of his Lovenox bridge. Will need to be continued for at least 2 days beyond therapeutic INR.  - Continue full dose Lovenox.  - Warfarin per pharmacy   # Tobacco abuse - patient continues to smoke approximately 1/2 ppd. He was counseled on the dangers of smoking, importance of cessation, and tools that can be use to pursue smoking cessation.   # Cocaine abuse: Patient will be counseled regarding the negative effects of cocaine today.  Resources will be offered.    DVT PPX - full dose lovenox and coumadin  Code Status - FULL CODE - this was confirmed with the patient.    LOS: 1 day   This is a Psychologist, occupational Note.  The care of the patient was discussed with Dr. Clyde Lundborg and the assessment and plan formulated with their assistance.  Please see their attached note for official documentation of the daily encounter.  Maren Beach 01/21/2012, 12:39 PM    Resident Addendum to Medical Student Note   I have seen and examined the patient, and agree with the the medical student assessment and plan outlined above. Please see my brief note below for additional details.  S:  Patient reports having headache and pleuritic chest pain. Denied HA, dizziness, SOB, palpitation.   OBJECTIVE: VS: Reviewed  Meds: Reviewed  Labs: Reviewed  Imaging: Reviewed    General: resting in bed, not in acute distress HEENT: PERRL, EOMI, no scleral icterus Cardiac: S1/S2, RRR, No murmurs, gallops or rubs Pulm: Good air movement bilaterally, Clear to auscultation bilaterally, No rales, wheezing, rhonchi or rubs. Abd: Soft,  nondistended, nontender, no rebound pain, no organomegaly, BS present Ext: No rashes or edema, 2+DP/PT pulse bilaterally Musculoskeletal: No joint deformities, erythema, or stiffness, ROM full and no nontender Skin: no rashes. No skin bruise. Neuro: alert and oriented X3, cranial nerves II-XII grossly intact, muscle strength 5/5 in all  extremeties,  sensation to light touch intact.  Psych.: patient is not psychotic, no suicidal or hemocidal ideation.   ASSESSMENT/ PLAN:  # Syncope: etiology is not clear now. It is likely related to his cocaine use. His drug screen test is positive for cocaine. This may also explain the episodes of tachycardia. No metabolic derangements to suggest contribution to syncopal event. No neurological deficits to suggest neurologic syncope. Other differential diagnosis include seizure (less likely given no witnessed seizure episodes and negative EEG), cardiac issues (no red alarm on Tel, CE negative), and PE (less likely the major contributor, given that CTA showed 1~2 small area of infarction). EKG showed ST elevation in Lead II, V3, V5, and V6, which is concerning for pericarditis.    -pending 2D echo, if yes for pericarditis, will treat with NSAIDs ( his Cre is normal now). -continue to manage symptomatically    # Elevated LFT: AST 37 and ALT 98 on admission. Both elevated today: AST: 96, ALT 156. Unknown etiology at this time. Will get more history on patient's cocaine use to assess risk factors for hepatitis. Patient received a total of 6 tablets of ultracet yesterday for pain.    - will get hepatitis panel and LFT - will switch ultracet to tramadol for now   Length of Stay: 1  Lorretta Harp, MD PGY2, Internal Medicine Teaching Service Pager: [phone] 01/21/2012,  5:04 PM

## 2012-01-21 NOTE — Progress Notes (Signed)
ANTICOAGULATION CONSULT NOTE - Initial Consult  Pharmacy Consult for Coumadin Indication: DVT, recent h/o PE  No Known Allergies  Patient Measurements: Height: 6\' 4"  (193 cm) Weight: 240 lb 15.4 oz (109.3 kg) IBW/kg (Calculated) : 86.8    Vital Signs: Temp: 97.4 F (36.3 C) (11/02 0500) BP: 128/89 mmHg (11/02 0506) Pulse Rate: 68  (11/02 0506)  Labs:  Basename 01/21/12 0528 01/21/12 0525 01/20/12 2345 01/20/12 1749 01/20/12 1110 01/20/12 0550  HGB -- 13.7 -- -- -- 14.4  HCT -- 40.1 -- -- -- 42.3  PLT -- 158 -- -- -- 173  APTT -- -- -- -- -- 30  LABPROT -- 15.9* -- -- -- 14.9  INR -- 1.30 -- -- -- 1.19  HEPARINUNFRC -- -- -- -- -- --  CREATININE -- 1.26 -- -- -- 1.30  CKTOTAL -- -- 125 128 122 --  CKMB -- -- 2.6 2.5 2.3 --  TROPONINI <0.30 -- <0.30 <0.30 -- --    Estimated Creatinine Clearance: 111.9 ml/min (by C-G formula based on Cr of 1.26).   Medical History: Past Medical History  Diagnosis Date  . PE (pulmonary embolism) 2011; 07/2011  . Syncope and collapse 01/16/2012    "woke up to dog licking my face" (01/16/2012)  . 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 Cocaine abuse   . Family history of early CAD     Father deceased at 76 yo with MI  . Tobacco abuse     Medications:  Lovenox 110mg  SQ q12h  Assessment: 36yo M on Lovenox bridging to Coumadin for DVT and recent h/o PE. Patient was discharged 10/29 on Lovenox and Coumadin bridge. Pt readmitted for syncope, baseline INR 1.19. Patient states that he did not take any of his Coumadin after discharge - will have to restart anticoagulation overlap (Day 2 of minimum 5). Patient had previously been on Coumadin and was managed by Dr. Alexandria Lodge - last therapeutic regimen was reported as Coumadin 5mg  daily except 7.5mg  on Mondays and Thursdays. - H/H and Plts wnl - No significant bleeding reported  INR today 1.30, remains subtherapeutic.    Goal of Therapy:  INR 2-3 Anti-Xa level 0.6-1.2  units/ml 4hrs after LMWH dose given Monitor platelets by anticoagulation protocol: Yes   Plan:  1. Coumadin 7.5 mg po x 1 today 2. Continue Lovenox 110mg  SQ q12h, per MD  [BJY 2 of min 5].  3. Daily INR    Doris Cheadle, PharmD Clinical Pharmacist Pager: [phone] Phone: [phone] 01/21/2012 1:08 PM

## 2012-01-21 NOTE — Progress Notes (Signed)
Internal Medicine Teaching Service Attending Note Date: 01/21/2012  Patient name: [name]  Medical record number: [id number]  Date of birth: [birth year]    This patient has been seen and discussed with the house staff. Please see their note for complete details. I concur with their findings with the following additions/corrections:   Ultracet has been discontinued and tramadol started due to LFT derangement.  In previous note, it was not clear.   MULLEN, EMILY 01/21/2012, 8:26 PM

## 2012-01-22 DIAGNOSIS — Z7901 Long term (current) use of anticoagulants: Secondary | ICD-10-CM

## 2012-01-22 DIAGNOSIS — I82409 Acute embolism and thrombosis of unspecified deep veins of unspecified lower extremity: Secondary | ICD-10-CM

## 2012-01-22 DIAGNOSIS — F1410 Cocaine abuse, uncomplicated: Secondary | ICD-10-CM

## 2012-01-22 LAB — HEPATITIS PANEL, ACUTE
HCV Ab: NEGATIVE
Hepatitis B Surface Ag: NEGATIVE

## 2012-01-22 MED ORDER — SODIUM CHLORIDE 0.9 % IV SOLN: INTRAVENOUS | Status: DC

## 2012-01-22 NOTE — Progress Notes (Signed)
Patient left AMA prior to being seen by me.  He was seen by nursing staff and Dr. Clyde Lundborg and advised to remain in hospital for acute issues including possible pericarditis, renal insufficiency and elevated LFTs, however, patient decided to leave AMA.  He did not sign any paperwork.

## 2012-01-22 NOTE — Progress Notes (Signed)
Pt wanted to go AMA. Dr. Clyde Lundborg aware of situation. Pt indicated he has a family emergency and he needed to go home and he cannot wait  Any   longer.  Had spoken with the pt at length about his use of cocaine and its possible effect on the heart and its physiology. Had discussed with Med student about possible pericarditis on account of assessment findings. Pt left before this Rn had rte chance to let him sign AMA consent. Pt self remove his IV access x 2 seen in the trashcan. Thanks ! Ancil Linsey RN

## 2012-01-22 NOTE — Discharge Summary (Signed)
Patient Name:  [name]  MRN: [id number]  PCP: Elyse Jarvis, MD  DOB:  10/21/1975       Date of Admission:  01/20/2012  Date of Discharge:  01/22/2012      Attending Physician: Dr. Criselda Peaches     DISCHARGE DIAGNOSES:  1. Chest pain 2.  PULMONARY EMBOLISM 3.  Long term (current) use of anticoagulants 4.  DVT (deep venous thrombosis) 5. Cocaine abuse 6. TOBACCO USER 7. Abnormal LFT  DISPOSITION AND FOLLOW-UP:   [name] is to follow-up with the listed providers as detailed below, at patient's visiting, please address following issues:  Please check his INR. Please discontinue his Lovenox at 2 days after his INR becomes therapeutic.   Discharge Orders    Future Appointments: Provider: Department: Dept Phone: Center:   01/24/2012 9:00 AM Elyse Jarvis, MD [facility] [phone] [facility]      DISCHARGE MEDICATIONS:   Medication List     As of 01/22/2012  7:58 PM    ASK your doctor about these medications         enoxaparin 100 MG/ML injection   Commonly known as: LOVENOX   Inject 1.1 mLs (110 mg total) into the skin every 12 (twelve) hours.      warfarin 7.5 MG tablet   Commonly known as: COUMADIN   Take 1 tablet (7.5 mg total) by mouth one time only at 6 PM.         CONSULTS: none      PROCEDURES PERFORMED:  Ct Angio Chest Pe W/cm &/or Wo Cm  01/20/2012  *RADIOLOGY REPORT*  Clinical Data: chest pain, syncope.  history of pulmonary embolism and deep venous thrombosis.  CT ANGIOGRAPHY CHEST  Technique:  Multidetector CT imaging of the chest using the standard protocol during bolus administration of intravenous contrast. Multiplanar reconstructed images including MIPs were obtained and reviewed to evaluate the vascular anatomy.  Contrast: 80mL OMNIPAQUE IOHEXOL 350 MG/ML SOLN  Comparison: Plain film of earlier in the day.  CT of 08/10/2011.  Findings: Lung windows demonstrate mild motion degradation.  Patchy areas of atelectasis  bilaterally.  A probable subpleural lymph node on the right major fissure 3 mm on image 35/series 5.  Soft tissue windows:  The quality of this exam for evaluation of pulmonary embolism is moderate.  There is a large amount of contrast remaining in the SVC.  A filling defect is identified within a right upper lobe segmental to sub segmental branch, including on image 110/series 6.  The overall clot burden is decreased since the prior exam.  No other definite pulmonary emboli are identified.  A focus of relative hypoenhancement within the lingula on image 149/series 6 could be due to mixing phenomenon.  Normal aortic caliber.  No evidence of right heart strain. Normal heart size without pericardial or pleural effusion.  No mediastinal or hilar adenopathy.  Limited abdominal imaging demonstrates no significant findings.  No acute osseous abnormality.  IMPRESSION: Moderate quality exam for evaluation of pulmonary embolism, with a large amount contrast remaining in the SVC.  A filling defect within the right upper lobe is highly suspicious for a small volume embolus; decreased since 08/10/2011.  An area in the lingula could be due to mixing phenomenon or small volume embolus. I personally called and discussed this report with Dr. Preston Fleeting  at 8:29 a.m. on 01/20/2012.   Original Report Authenticated By: Jeronimo Greaves, M.D.    Dg Chest Portable 1 View  01/20/2012  *  RADIOLOGY REPORT*  Clinical Data: Chest pain.  PORTABLE CHEST - 1 VIEW  Comparison: PA and lateral chest 08/11/2011.  Findings: Lungs are clear.  Heart size is normal.  No pneumothorax or pleural fluid.  IMPRESSION: Negative chest.   Original Report Authenticated By: Holley Dexter, M.D.        ADMISSION DATA: H&P: Patient is a 36 y.o. male with a PMHx of multiple PE, lupus anticoagulant (+), family history of early MI who presents to [facility] for evaluation of an unwitnessed syncopal episode that occured at 4:00AM on the morning of admission. The patient  states that he walked out of his friend's car and walked approximately 20 feet, after which he began to develop sudden shortness of breath with associated tachycardia, lasting for a few 5-10 seconds. He felt this to be a prodrome for a syncopal event, and therefore himself down to the ground to avoid injury. He subsequently completely blacked out for approximately 3-4 minutes, and then awoke with a sharp, bitemporal headache that also extends behind his eyes with associated photophobia. He also experienced tinnitus and nausea with one episode of vomiting. States he was able to recognize his surroundings and understood what just transpired immediately upon awakening. He notably did not sustain fecal or urinary incontinence or tongue biting during the episode. States that at the time of evaluation, he continues to have severe headache and photophobia and pleuritic chest pain.  Although the patient was at a Halloween party on the evening prior to admission, he denies use of alcohol or illicit drugs.  Of note, the patient was recently hospitalized from 10/28-10/29/2103 for recurrent DVT after being off of coumadin x 4 weeks. On presentation, he also presented with syncopal event after getting out of his shower. The event was thought to be secondary to his known PE. Since hospital discharge, the patient has otherwise been doing well, without fevers, chills, cough, chest pain, congestion, abdominal pain, vision changes, vision loss, or headaches. He also states although he has previously experienced syncopal episodes in 2010, they were in the context of cocaine abuse, which he currently does not endorse. He played sports as a child and never had syncopal events after exertion.    Physical Exam:    General Exam:      Head:  Normocephalic, atraumatic.   Eyes:  No signs of anemia or jaundince.   Nose:  Mucous membranes moist, not inflammed, nonerythematous.   Throat:  Oropharynx nonerythematous, no exudate  appreciated.    Neck:  No deformities, masses, or tenderness noted. Supple, No carotid Bruits, no JVD.   Lungs:   Normal respiratory effort. Clear to auscultation BL without crackles or wheezes.   Heart:  RRR. S1 and S2 normal without gallop, murmur, or rubs.   Abdomen:   BS normoactive. Soft, Nondistended, non-tender.  No masses or organomegaly.   Extremities:  No pretibial edema.   Skin:  No visible rashes, scars.        Neurologic Exam:      Mental Status:  Alert, oriented, thought content appropriate.  Speech fluent without evidence of aphasia. Able to follow 3 step commands without difficulty.   Cranial Nerves:     II:  Visual fields grossly intact.   III/IV/VI:  Extraocular movements intact.  Pupils reactive bilaterally.   V/VII:  Smile symmetric. facial light touch sensation normal bilaterally.   VIII:  Grossly intact.   IX/X:  Normal gag.   XI:  Bilateral shoulder shrug normal.   XII:  Midline tongue extension normal.   Motor:   5/5 bilaterally with normal tone and bulk   Sensory:   Pinprick and light touch intact throughout, bilaterally   DTRs:  2+ and symmetric throughout   Cerebellar:  Normal finger-to-nose, normal rapid alternating movements and normal heel-to-shin test.  Normal gait and station.     Lab results: Comprehensive Metabolic Panel:    Component  Value  Date/Time     NA  138  01/20/2012 0550     K  3.7  01/20/2012 0550     CL  101  01/20/2012 0550     CO2  27  01/20/2012 0550     BUN  9  01/20/2012 0550     CREATININE  1.30  01/20/2012 0550     GLUCOSE  109*  01/20/2012 0550     CALCIUM  9.4  01/20/2012 0550     AST  37  01/20/2012 0550     ALT  98*  01/20/2012 0550     ALKPHOS  66  01/20/2012 0550     BILITOT  0.4  01/20/2012 0550     PROT  6.8  01/20/2012 0550     ALBUMIN  3.5  01/20/2012 0550      CBC:    Component  Value  Date/Time     WBC  12.3*  01/20/2012 0550     HGB  14.4  01/20/2012 0550     HCT  42.3  01/20/2012 0550     PLT  173  01/20/2012 0550       MCV  85.8  01/20/2012 0550     NEUTROABS  8.5*  01/16/2012 0828     LYMPHSABS  1.8  01/16/2012 0828     MONOABS  1.1*  01/16/2012 0828     EOSABS  0.3  01/16/2012 0828     BASOSABS  0.0  01/16/2012 0828     Lab Results   Component  Value  Date     INR  1.19  01/20/2012     INR  1.11  01/17/2012     INR  1.07  01/16/2012       Lab Results   Component  Value  Date     TSH  3.050  08/10/2011    HOSPITAL COURSE:  # Chest pain: The etiology of patient's chest pain is unclear at this time.  PE or cocaine induced are the most likely cause.  CT angio done on 11/1 showed 1-2 small areas of PE. UDS was positive for cocaine, THC, and opiates.  ACS is lower on differential given negative prior work up (trop negative, CE negative, 2d echo: normal systolic function, EF 60-65%, no wall motion abnormalities).  Pericarditis was thought to be a possibility.  However, echo showed no pericardial effusion. Patient was treated symptomatically with nitroglycerin and tramadol prn during his stay. Patient responded to the treatment well. His chest pain improved. Unfortunately, patient received a call from home and said that he has family emergency and insisted going home immeditely. His INR is still not not therapeutic. We explained all the risks to him for going home at this time. He agreed to continue his lovenox and coumadin and follow up in our clinic. He left hospital after signed AMA form.  Elevated Creatinine: unclear etiology at this time. Cr 1.42 on admission. 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 U/A negative for acute process except  for 15 ketones. Cr decreased to 1.26 before he left.  # Elevated LFT:  AST 116 and ALT 312 on admission. Hepatitis panel and HIV are negative. It is likely related to cocaine use. Cocaine use can cause increase in liver enzyme.    # Pulmonary embolism in setting of (+) lupus anticoagulant - subtherapeutic INR of 1.19 on  admission. He was being bridged with lovenox and on coumadin. He agreed to continue to take Lovenox and Warfarin. INR was 1.61 when he left against AMA  # Cocaine abuse:  He denied use of cocaine on admission. But his UDS is positive for cocaine, THC, and opiates on admission.  Counseled on the harm of cocaine to multiple organs.  DISCHARGE DATA: Vital Signs: BP 112/77  Pulse 71  Temp 98.3 F (36.8 C) (Oral)  Resp 16  Ht 6\' 4"  (1.93 m)  Wt 234 lb 12.6 oz (106.5 kg)  BMI 28.58 kg/m2  SpO2 98%  Labs: Results for orders placed during the hospital encounter of 01/20/12 (from the past 24 hour(s))  PROTIME-INR     Status: Abnormal   Collection Time   01/22/12  5:00 AM      Component Value Range   Prothrombin Time 17.9 (*) 11.6 - 15.2 seconds   INR 1.52 (*) 0.00 - 1.49     Time Spent on Discharge: 35 min   Signed: Lorretta Harp, MD PGY I, Internal Medicine Resident 01/22/2012, 7:58 PM

## 2012-01-23 ENCOUNTER — Observation Stay (HOSPITAL_COMMUNITY)
Admission: EM | Admit: 2012-01-23 | Discharge: 2012-01-23 | Disposition: A | Discharge status: Home / Self Care | Payer: Self-pay | Attending: Internal Medicine | Admitting: Internal Medicine

## 2012-01-23 ENCOUNTER — Encounter (HOSPITAL_COMMUNITY): Payer: Self-pay | Admitting: Emergency Medicine

## 2012-01-23 DIAGNOSIS — I2782 Chronic pulmonary embolism: Secondary | ICD-10-CM | POA: Insufficient documentation

## 2012-01-23 DIAGNOSIS — I825Z9 Chronic embolism and thrombosis of unspecified deep veins of unspecified distal lower extremity: Secondary | ICD-10-CM | POA: Insufficient documentation

## 2012-01-23 DIAGNOSIS — I82409 Acute embolism and thrombosis of unspecified deep veins of unspecified lower extremity: Secondary | ICD-10-CM | POA: Diagnosis present

## 2012-01-23 DIAGNOSIS — R799 Abnormal finding of blood chemistry, unspecified: Secondary | ICD-10-CM | POA: Insufficient documentation

## 2012-01-23 DIAGNOSIS — F1410 Cocaine abuse, uncomplicated: Secondary | ICD-10-CM | POA: Insufficient documentation

## 2012-01-23 DIAGNOSIS — F17200 Nicotine dependence, unspecified, uncomplicated: Secondary | ICD-10-CM | POA: Diagnosis present

## 2012-01-23 DIAGNOSIS — I2699 Other pulmonary embolism without acute cor pulmonale: Secondary | ICD-10-CM | POA: Diagnosis present

## 2012-01-23 DIAGNOSIS — R7989 Other specified abnormal findings of blood chemistry: Secondary | ICD-10-CM | POA: Insufficient documentation

## 2012-01-23 DIAGNOSIS — R55 Syncope and collapse: Secondary | ICD-10-CM

## 2012-01-23 DIAGNOSIS — R079 Chest pain, unspecified: Principal | ICD-10-CM | POA: Diagnosis present

## 2012-01-23 DIAGNOSIS — R748 Abnormal levels of other serum enzymes: Secondary | ICD-10-CM

## 2012-01-23 DIAGNOSIS — R894 Abnormal immunological findings in specimens from other organs, systems and tissues: Secondary | ICD-10-CM | POA: Insufficient documentation

## 2012-01-23 DIAGNOSIS — Z7901 Long term (current) use of anticoagulants: Secondary | ICD-10-CM

## 2012-01-23 LAB — URINALYSIS, ROUTINE W REFLEX MICROSCOPIC
Bilirubin Urine: NEGATIVE
Hgb urine dipstick: NEGATIVE
Ketones, ur: 15 mg/dL — AB
Nitrite: NEGATIVE
Specific Gravity, Urine: 1.025 (ref 1.005–1.030)
Urobilinogen, UA: 0.2 mg/dL (ref 0.0–1.0)
pH: 5.5 (ref 5.0–8.0)

## 2012-01-23 LAB — LIPID PANEL
Cholesterol: 155 mg/dL (ref 0–200)
HDL: 36 mg/dL — ABNORMAL LOW (ref >39)
Total CHOL/HDL Ratio: 4.3 RATIO
Triglycerides: 126 mg/dL (ref <150)

## 2012-01-23 LAB — HEPATIC FUNCTION PANEL
ALT: 277 U/L — ABNORMAL HIGH (ref 0–53)
AST: 116 U/L — ABNORMAL HIGH (ref 0–37)
Albumin: 4 g/dL (ref 3.5–5.2)
Alkaline Phosphatase: 89 U/L (ref 39–117)
Alkaline Phosphatase: 83 U/L (ref 39–117)
Bilirubin, Direct: <0.1 mg/dL (ref 0.0–0.3)
Total Bilirubin: 0.3 mg/dL (ref 0.3–1.2)
Total Bilirubin: 0.2 mg/dL — ABNORMAL LOW (ref 0.3–1.2)
Total Protein: 6.9 g/dL (ref 6.0–8.3)

## 2012-01-23 LAB — RAPID URINE DRUG SCREEN, HOSP PERFORMED
Amphetamines: NOT DETECTED
Barbiturates: NOT DETECTED
Benzodiazepines: NOT DETECTED
Tetrahydrocannabinol: POSITIVE — AB

## 2012-01-23 LAB — CBC
HCT: 44.3 % (ref 39.0–52.0)
Hemoglobin: 15.4 g/dL (ref 13.0–17.0)
Hemoglobin: 14.6 g/dL (ref 13.0–17.0)
MCH: 29.9 pg (ref 26.0–34.0)
MCH: 29.3 pg (ref 26.0–34.0)
MCHC: 34.8 g/dL (ref 30.0–36.0)
MCV: 86 fL (ref 78.0–100.0)
Platelets: 196 10*3/uL (ref 150–400)
RBC: 5.15 MIL/uL (ref 4.22–5.81)
RBC: 4.99 MIL/uL (ref 4.22–5.81)
WBC: 12.4 10*3/uL — ABNORMAL HIGH (ref 4.0–10.5)

## 2012-01-23 LAB — BASIC METABOLIC PANEL
BUN: 19 mg/dL (ref 6–23)
CO2: 26 mEq/L (ref 19–32)
Calcium: 9.4 mg/dL (ref 8.4–10.5)
Calcium: 9.3 mg/dL (ref 8.4–10.5)
GFR calc Af Amer: 85 mL/min — ABNORMAL LOW (ref >90)
GFR calc non Af Amer: 63 mL/min — ABNORMAL LOW (ref >90)
GFR calc non Af Amer: 73 mL/min — ABNORMAL LOW (ref >90)
Glucose, Bld: 107 mg/dL — ABNORMAL HIGH (ref 70–99)
Potassium: 3.8 mEq/L (ref 3.5–5.1)
Sodium: 141 mEq/L (ref 135–145)
Sodium: 141 mEq/L (ref 135–145)

## 2012-01-23 LAB — PROTIME-INR: INR: 1.61 — ABNORMAL HIGH (ref 0.00–1.49)

## 2012-01-23 LAB — HIV ANTIBODY (ROUTINE TESTING W REFLEX): HIV: NONREACTIVE

## 2012-01-23 MED ORDER — TRAMADOL HCL 50 MG PO TABS: 50.0000 mg | ORAL_TABLET | Freq: Four times a day (QID) | ORAL | Status: DC | PRN

## 2012-01-23 MED ORDER — MORPHINE SULFATE 4 MG/ML IJ SOLN
4.0000 mg | INTRAMUSCULAR | Status: AC
  Administered 2012-01-23: 4 mg via INTRAVENOUS
  Filled 2012-01-23: qty 1

## 2012-01-23 MED ORDER — WARFARIN - PHARMACIST DOSING INPATIENT: Freq: Every day | Status: DC

## 2012-01-23 MED ORDER — ASPIRIN EC 81 MG PO TBEC
81.0000 mg | DELAYED_RELEASE_TABLET | Freq: Every day | ORAL | Status: DC
  Administered 2012-01-23: 81 mg via ORAL
  Filled 2012-01-23: qty 1

## 2012-01-23 MED ORDER — NITROGLYCERIN 0.4 MG SL SUBL: 0.4000 mg | SUBLINGUAL_TABLET | SUBLINGUAL | Status: DC | PRN

## 2012-01-23 MED ORDER — SODIUM CHLORIDE 0.9 % IJ SOLN: 3.0000 mL | Freq: Two times a day (BID) | INTRAMUSCULAR | Status: DC

## 2012-01-23 MED ORDER — WARFARIN SODIUM 7.5 MG PO TABS
7.5000 mg | ORAL_TABLET | Freq: Once | ORAL | Status: AC
  Administered 2012-01-23: 7.5 mg via ORAL
  Filled 2012-01-23: qty 1

## 2012-01-23 MED ORDER — SODIUM CHLORIDE 0.9 % IJ SOLN
3.0000 mL | Freq: Two times a day (BID) | INTRAMUSCULAR | Status: DC
  Administered 2012-01-23: 3 mL via INTRAVENOUS

## 2012-01-23 MED ORDER — NITROGLYCERIN 0.4 MG SL SUBL
0.4000 mg | SUBLINGUAL_TABLET | SUBLINGUAL | Status: DC | PRN
  Administered 2012-01-23 (×3): 0.4 mg via SUBLINGUAL
  Filled 2012-01-23: qty 75

## 2012-01-23 MED ORDER — TRAMADOL HCL 50 MG PO TABS
50.0000 mg | ORAL_TABLET | Freq: Four times a day (QID) | ORAL | Status: DC | PRN
  Administered 2012-01-23: 50 mg via ORAL
  Filled 2012-01-23: qty 1

## 2012-01-23 MED ORDER — ASPIRIN 81 MG PO TBEC: 81.0000 mg | DELAYED_RELEASE_TABLET | Freq: Every day | ORAL | Status: DC

## 2012-01-23 MED ORDER — ENOXAPARIN SODIUM 120 MG/0.8ML ~~LOC~~ SOLN
1.0000 mg/kg | Freq: Two times a day (BID) | SUBCUTANEOUS | Status: DC
  Administered 2012-01-23: 105 mg via SUBCUTANEOUS
  Filled 2012-01-23 (×3): qty 0.8

## 2012-01-23 NOTE — Progress Notes (Deleted)
ANTICOAGULATION CONSULT NOTE - Follow-Up Consult  Pharmacy Consult for Lovenox, Coumadin Indication: H/o PE, DVT, lupus anticoagulant (+)  No Known Allergies  Labs:  Basename 01/23/12 0429 01/23/12 0108 01/22/12 0500 01/21/12 1308 01/21/12 0528 01/21/12 0525 01/20/12 2345 01/20/12 1749 01/20/12 1110  HGB 14.6 15.4 -- -- -- -- -- -- --  HCT 42.6 44.3 -- -- -- 40.1 -- -- --  PLT 196 200 -- -- -- 158 -- -- --  APTT -- -- -- -- -- -- -- -- --  LABPROT -- 18.6* 17.9* -- -- 15.9* -- -- --  INR -- 1.61* 1.52* -- -- 1.30 -- -- --  HEPARINUNFRC -- -- -- -- -- -- -- -- --  CREATININE 1.26 1.42* -- 1.40* -- -- -- -- --  CKTOTAL -- -- -- -- -- -- 125 128 122  CKMB -- -- -- -- -- -- 2.6 2.5 2.3  TROPONINI -- -- -- -- <0.30 -- <0.30 <0.30 --    Estimated Creatinine Clearance: 110.9 ml/min (by C-G formula based on Cr of 1.26).   Assessment: 36 yo male with h/o of multiple PE and DVT, lupus anticoagulant (+) admitted with chest pain.  INR is below-goal. Pharmacy consulted to manage Coumadin and Lovenox.  INR = 1.61 today   Goal of Therapy:  INR 2-3 Monitor platelets by anticoagulation protocol: Yes   Plan:  1. Coumadin 10 mg po today. 2. Lovenox 105mg  SQ Q12H. 3. Daily PT / INR  Thank you. Okey Regal, PharmD [phone]  01/23/2012,10:11 AM

## 2012-01-23 NOTE — ED Provider Notes (Signed)
History     CSN: [id number]  Arrival date & time 01/23/12  0059   First MD Initiated Contact with Patient 01/23/12 0105      Chief Complaint  Patient presents with  . Chest Pain    (Consider location/radiation/quality/duration/timing/severity/associated sxs/prior treatment) Patient is a 36 y.o. male presenting with chest pain. The history is provided by the patient.  Chest Pain   He has history of DVT and PE for which she is on warfarin. He had been admitted to the hospital for evaluation of syncope but left AGAINST MEDICAL ADVICE on the morning of free air. He was told there were still some tests and that his physician had wanted to do. Since discharge, he has developed a heaviness across the anterior chest and recurrence of dyspnea. Chest pain is rated at 8/10. It has been as severe as 10/10. Nothing makes it better nothing makes it worse. He has also developed some aching in his legs and in both flank areas which has been as severe as 8/10. He denies fever, chills, sweats. Denies nausea, vomiting, diarrhea. He is returned to the hospital primarily because he wanted to check back in positions and wanted to do.  Past Medical History  Diagnosis Date  . PE (pulmonary embolism) 2011; 07/2011  . Syncope and collapse 01/16/2012    "woke up to dog licking my face" (01/16/2012)  . 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 Cocaine abuse   . Family history of early CAD     Father deceased at 52 yo with MI  . Tobacco abuse     Past Surgical History  Procedure Date  . No past surgeries     Family History  Problem Relation Age of Onset  . Coronary artery disease Father 43    MI at age 51  . Colon cancer Paternal Grandmother   . Cancer Maternal Grandfather     History  Substance Use Topics  . Smoking status: Current Every Day Smoker -- 0.5 packs/day for 5 years    Types: Cigarettes  . Smokeless tobacco: Never Used  . Alcohol Use: Yes     Comment: occasion       Review of Systems  Cardiovascular: Positive for chest pain.  All other systems reviewed and are negative.    Allergies  Review of patient's allergies indicates no known allergies.  Home Medications   Current Outpatient Rx  Name  Route  Sig  Dispense  Refill  . ENOXAPARIN SODIUM 100 MG/ML [place] SOLN   Subcutaneous   Inject 1.1 mLs (110 mg total) into the skin every 12 (twelve) hours.   8 Syringe   0     Patient was given the supply of lovenox from the h ...   . WARFARIN SODIUM 7.5 MG PO TABS   Oral   Take 1 tablet (7.5 mg total) by mouth one time only at 6 PM.   5 tablet   0     BP 141/89  Pulse 110  Temp 97.9 F (36.6 C) (Oral)  SpO2 95%  Physical Exam  Nursing note and vitals reviewed. 36 year old male, resting comfortably and in no acute distress. Vital signs are significant for borderline hypertension with blood pressure 141/89, and tachycardia with heart rate 110. Oxygen saturation is 95%, which is normal. Head is normocephalic and atraumatic. PERRLA, EOMI. Oropharynx is clear. Neck is nontender and supple without adenopathy or JVD. There are no carotid bruits. Back is  nontender and there is no CVA tenderness. Lungs are clear without rales, wheezes, or rhonchi. Chest is nontender. Heart has regular rate and rhythm without murmur. Abdomen is soft, flat, nontender without masses or hepatosplenomegaly and peristalsis is normoactive. Extremities have no cyanosis or edema, full range of motion is present. Skin is warm and dry without rash. Neurologic: Mental status is normal, cranial nerves are intact, there are no motor or sensory deficits.   ED Course  Procedures (including critical care time)  Results for orders placed during the hospital encounter of 01/23/12  CBC      Component Value Range   WBC 13.9 (*) 4.0 - 10.5 K/uL   RBC 5.15  4.22 - 5.81 MIL/uL   Hemoglobin 15.4  13.0 - 17.0 g/dL   HCT 29.5  62.1 - 30.8 %   MCV 86.0  78.0 - 100.0 fL   MCH  29.9  26.0 - 34.0 pg   MCHC 34.8  30.0 - 36.0 g/dL   RDW 65.7  84.6 - 96.2 %   Platelets 200  150 - 400 K/uL  BASIC METABOLIC PANEL      Component Value Range   Sodium 141  135 - 145 mEq/L   Potassium 4.0  3.5 - 5.1 mEq/L   Chloride 105  96 - 112 mEq/L   CO2 26  19 - 32 mEq/L   Glucose, Bld 107 (*) 70 - 99 mg/dL   BUN 19  6 - 23 mg/dL   Creatinine, Ser 9.52 (*) 0.50 - 1.35 mg/dL   Calcium 9.4  8.4 - 84.1 mg/dL   GFR calc non Af Amer 63 (*) >90 mL/min   GFR calc Af Amer 73 (*) >90 mL/min  PROTIME-INR      Component Value Range   Prothrombin Time 18.6 (*) 11.6 - 15.2 seconds   INR 1.61 (*) 0.00 - 1.49  HEPATIC FUNCTION PANEL      Component Value Range   Total Protein 7.4  6.0 - 8.3 g/dL   Albumin 4.0  3.5 - 5.2 g/dL   AST 324 (*) 0 - 37 U/L   ALT 312 (*) 0 - 53 U/L   Alkaline Phosphatase 89  39 - 117 U/L   Total Bilirubin 0.3  0.3 - 1.2 mg/dL   Bilirubin, Direct <4.0  0.0 - 0.3 mg/dL   Indirect Bilirubin NOT CALCULATED  0.3 - 0.9 mg/dL  POCT I-STAT TROPONIN I      Component Value Range   Troponin i, poc 0.00  0.00 - 0.08 ng/mL   Comment 3              Date: 01/23/2012  Rate: 107  Rhythm: sinus tachycardia  QRS Axis: normal  Intervals: normal  ST/T Wave abnormalities: nonspecific T wave changes  Conduction Disutrbances:none  Narrative Interpretation: Sinus tachycardia with nonspecific T wave flattening. When compared with ECG of 01/21/2012, heart rate has increased, early repolarization is no longer present, nonspecific T-wave flattening is now present.  Old EKG Reviewed: none available    1. Chest pain   2. Syncope   3. Elevated liver enzymes       MDM  Recurrent chest pain in a patient with known pulmonary embolism. Old records are reviewed, and CT angiogram done days ago did demonstrate a persistent embolism in an upper lobe artery.  Laboratory workup shows rising transaminase levels. Case is discussed with resident on call for outpatient clinics, and  arrangements are made to admit the patient. Also, of  note, he remains subtherapeutic on his warfarin.     Dione Booze, MD 01/23/12 [phone]

## 2012-01-23 NOTE — Progress Notes (Signed)
ANTICOAGULATION CONSULT NOTE - Initial Consult  Pharmacy Consult for Lovenox, Coumadin Indication: H/o PE, DVT, lupus anticoagulant (+)  No Known Allergies  Patient Measurements: Height: 6\' 4"  (193 cm) Weight: 236 lb 3.2 oz (107.14 kg) IBW/kg (Calculated) : 86.8   Vital Signs: Temp: 97.6 F (36.4 C) (11/04 0514) Temp src: Oral (11/04 0101) BP: 115/65 mmHg (11/04 0514) Pulse Rate: 69  (11/04 0514)  Labs:  Basename 01/23/12 0429 01/23/12 0108 01/22/12 0500 01/21/12 1308 01/21/12 0528 01/21/12 0525 01/20/12 2345 01/20/12 1749 01/20/12 1110  HGB 14.6 15.4 -- -- -- -- -- -- --  HCT 42.6 44.3 -- -- -- 40.1 -- -- --  PLT 196 200 -- -- -- 158 -- -- --  APTT -- -- -- -- -- -- -- -- --  LABPROT -- 18.6* 17.9* -- -- 15.9* -- -- --  INR -- 1.61* 1.52* -- -- 1.30 -- -- --  HEPARINUNFRC -- -- -- -- -- -- -- -- --  CREATININE 1.26 1.42* -- 1.40* -- -- -- -- --  CKTOTAL -- -- -- -- -- -- 125 128 122  CKMB -- -- -- -- -- -- 2.6 2.5 2.3  TROPONINI -- -- -- -- <0.30 -- <0.30 <0.30 --    Estimated Creatinine Clearance: 110.9 ml/min (by C-G formula based on Cr of 1.26).   Medical History: Past Medical History  Diagnosis Date  . PE (pulmonary embolism) 2011; 07/2011  . Syncope and collapse 01/16/2012    "woke up to dog licking my face" (01/16/2012)  . 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 Cocaine abuse   . Family history of early CAD     Father deceased at 5 yo with MI  . Tobacco abuse     Medications:  Scheduled:    . aspirin EC  81 mg Oral Daily  . [COMPLETED]  morphine injection  4 mg Intravenous STAT  . sodium chloride  3 mL Intravenous Q12H  . sodium chloride  3 mL Intravenous Q12H    Assessment: 36 yo male with h/o of multiple PE and DVT, lupus anticoagulant (+) admitted with chest pain.  INR is below-goal. Pharmacy consulted to manage Coumadin and Lovenox.  Goal of Therapy:  INR 2-3 Monitor platelets by anticoagulation protocol: Yes   Plan:    1. Coumadin 7.5mg  po today. 2. Lovenox 105mg  SQ Q12H. 3. Daily PT / INR 4. CBC Q72H while on Lovenox.   Emeline Gins 01/23/2012,6:55 AM

## 2012-01-23 NOTE — Care Management Note (Unsigned)
    Page 1 of 1   01/23/2012     12:27:57 PM   CARE MANAGEMENT NOTE 01/23/2012  Patient:  [name], [name]   Account Number:  [id number]  Date Initiated:  01/23/2012  Documentation initiated by:  GRAVES-BIGELOW,Sharone Picchi  Subjective/Objective Assessment:   Pt admitted with cp. + UDS.     Action/Plan:   CM will continue ot monitor for disposition needs. Prior to recent d/c CM did help him with lovenox injections.   Anticipated DC Date:  01/25/2012   Anticipated DC Plan:  HOME/SELF CARE      DC Planning Services  CM consult      Choice offered to / List presented to:             Status of service:  In process, will continue to follow Medicare Important Message given?   (If response is "NO", the following Medicare IM given date fields will be blank) Date Medicare IM given:   Date Additional Medicare IM given:    Discharge Disposition:    Per UR Regulation:  Reviewed for med. necessity/level of care/duration of stay  If discussed at Long Length of Stay Meetings, dates discussed:    Comments:

## 2012-01-23 NOTE — ED Notes (Signed)
Old and new EKG given to Dr Glick 

## 2012-01-23 NOTE — Progress Notes (Signed)
Medical Student Daily Progress Note  Subjective: Patient's chest pain has improved overnight with Tramadol. He voiced understanding about the damage cocaine could do to one's health when he was counseled this morning. Denied HA, dizziness, SOB.   Objective: Vital signs in last 24 hours: Filed Vitals:   01/23/12 0430 01/23/12 0435 01/23/12 0445 01/23/12 0514  BP: 136/78 137/79 122/74 115/65  Pulse: 74 69 73 69  Temp:    97.6 F (36.4 C)  TempSrc:      Resp: 17 19 17 18   Height:    6\' 4"  (1.93 m)  Weight:    107.14 kg (236 lb 3.2 oz)  SpO2: 95% 96% 95% 95%   Weight change:   Intake/Output Summary (Last 24 hours) at 01/23/12 1356 Last data filed at 01/23/12 0615  Gross per 24 hour  Intake    240 ml  Output      0 ml  Net    240 ml   Physical Exam: General appearance: awake, cooperative, appears stated age and lying in bed with no appearance distress  Throat: lips, mucosa, and tongue normal; teeth and gums normal  Lungs: normal breath sounds bilaterally without audible crackles, rhales, or wheezes  Chest wall: no tenderness, no heaves  Heart: regular rate and rhythm, S1, S2 normal, no murmur, click, rub or gallop and no prominant S2  Abdomen: soft, non-tender; bowel sounds normal; no masses, no organomegaly  Extremities: extremities normal, atraumatic, no cyanosis or edema and right leg and foot without erythema or edema, equal temperature compared to the left leg. Pulses: 2+ and symmetric  Skin: Skin color, texture, turgor normal. No rashes or lesions  Neurologic: AAOx3, able to move all extremities, normal strength, and no noticeable sensory deficit.   Lab Results: Basic Metabolic Panel:  Lab 01/23/12 0865 01/23/12 0108  NA 141 141  K 3.8 4.0  CL 107 105  CO2 25 26  GLUCOSE 99 107*  BUN 18 19  CREATININE 1.26 1.42*  CALCIUM 9.3 9.4  MG -- --  PHOS -- --   Liver Function Tests:  Lab 01/23/12 0429 01/23/12 0153  AST 86* 116*  ALT 277* 312*  ALKPHOS 83 89    BILITOT 0.2* 0.3  PROT 6.9 7.4  ALBUMIN 3.6 4.0   CBC:  Lab 01/23/12 0429 01/23/12 0108 01/20/12 0550  WBC 12.4* 13.9* --  NEUTROABS -- -- 9.4*  HGB 14.6 15.4 --  HCT 42.6 44.3 --  MCV 85.4 86.0 --  PLT 196 200 --   Cardiac Enzymes:  Lab 01/23/12 0914 01/21/12 0528 01/20/12 2345 01/20/12 1749 01/20/12 1110  CKTOTAL -- -- 125 128 122  CKMB -- -- 2.6 2.5 2.3  CKMBINDEX -- -- -- -- --  TROPONINI <0.30 <0.30 <0.30 -- --   Fasting Lipid Panel:  Lab 01/23/12 0429  CHOL 155  HDL 36*  LDLCALC 94  TRIG 784  CHOLHDL 4.3  LDLDIRECT --   Coagulation:  Lab 01/23/12 0108 01/22/12 0500 01/21/12 0525 01/20/12 0550  LABPROT 18.6* 17.9* 15.9* 14.9  INR 1.61* 1.52* 1.30 1.19   Urine Drug Screen: Drugs of Abuse     Component Value Date/Time   LABOPIA POSITIVE* 01/23/2012 0255   COCAINSCRNUR POSITIVE* 01/23/2012 0255   LABBENZ NONE DETECTED 01/23/2012 0255   AMPHETMU NONE DETECTED 01/23/2012 0255   THCU POSITIVE* 01/23/2012 0255   LABBARB NONE DETECTED 01/23/2012 0255    Urinalysis:  Lab 01/23/12 0254  COLORURINE YELLOW  LABSPEC 1.025  PHURINE 5.5  GLUCOSEU NEGATIVE  HGBUR NEGATIVE  BILIRUBINUR NEGATIVE  KETONESUR 15*  PROTEINUR NEGATIVE  UROBILINOGEN 0.2  NITRITE NEGATIVE  LEUKOCYTESUR NEGATIVE   Medications: I have reviewed the patient's current medications. Scheduled Meds:   . aspirin EC  81 mg Oral Daily  . enoxaparin (LOVENOX) injection  1 mg/kg Subcutaneous Q12H  . [COMPLETED]  morphine injection  4 mg Intravenous STAT  . sodium chloride  3 mL Intravenous Q12H  . sodium chloride  3 mL Intravenous Q12H  . warfarin  7.5 mg Oral ONCE-1800  . Warfarin - Pharmacist Dosing Inpatient   Does not apply q1800   Continuous Infusions:  PRN Meds:.nitroGLYCERIN, traMADol Assessment/Plan: Active Problems:  TOBACCO USER  PULMONARY EMBOLISM  Long term (current) use of anticoagulants  DVT (deep venous thrombosis)  Chest pain  # Chest pain: The etiology of patient's  chest pain is unclear at this time.  PE or cocaine induced vasospasm is the most likely cause.  CT angio done on 11/1 showed 1-2 small areas of PE. UDS was positive for cocaine, THC, and opiates.  ACS is lower on differential given negative prior work up (trop x5 negative, CE negative, 2d echo: normal systolic function, EF 60-65%, no wall motion abnormalities).  Pericarditis was thought to be a possibility.  However, echo showed no pericardial effusion.  TSH 3.704. Patient was treated symptomatically with nitroglycerin and tramadol prn during his stay. This morning, patient stated that pain has improved compared to yesterday and agreed to be discharged home with tramadol for 1 week and be followed up in our clinic tomorrow.    # Elevated Creatinine: unclear etiology at this time. Cr 1.42 on admission. 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 U/A negative for acute process except for 15 ketones. Cr decreased to 1.26 on the day of discharge after IVF.   # Elevated LFT: AST 116 and ALT 312 on admission. ALT increased from 01/22/12: 192. Hepatitis panel and HIV are negative.  Cocaine use can cause increase in liver enzyme.  On day of discharge, patient's AST and ALT has decreased to 86 and 277 respectively.    # Pulmonary embolism in setting of (+) lupus anticoagulant - subtherapeutic INR of 1.61 on admission. He was being bridged with lovenox and on coumadin prior to him singing out AMA. Lovenox and Warfarin were continued per pharmacy. INR will be monitored at f/u appointments with Dr. Alexandria Lodge.  # Tobacco abuse - patient continues to smoke approximately 1/2 ppd. He has been counseled extensively on smoking and cessation strongly advised.   # Cocaine abuse: UDS positive for cocaine, THC, and opiates on admission. He denied use of cocaine on admission.  He was counseled on the harm of cocaine to multiple organs before discharge.    LOS: 0 days   This is a  Psychologist, occupational Note.  The care of the patient was discussed with Dr. Clyde Lundborg and the assessment and plan formulated with their assistance.  Please see their attached note for official documentation of the daily encounter.  Maren Beach 01/23/2012, 1:56 PM

## 2012-01-23 NOTE — H&P (Signed)
Internal Medicine Teaching Service Attending Note Date: 01/23/2012  Patient name: [name]  Medical record number: [id number]  Date of birth: 1975/07/07   I have seen and evaluated [name] and discussed their care with the Residency Team.    [name] is a 36yo man who presented to the hospital after leaving AMA on the morning prior to readmission.  He had left AMA due to a family emergency with his girlfriend.  He presents to the hospital again for evaluation of substernal chest pain/pressure which was previously being worked up as pericarditis vs. Pleuritic pain from known pulmonary emboli.  He reports at this admission that his chest pain is not different from previous, substernal and worse with breathing, 7/10 on pain scale and non radiating.  He also has some SOB and pain in his RLE which is due to his DVT.  He specifically denies fever, chills, nausea, vomiting, diarrhea, constipation or urinary complaints to me.   Prior to leaving AMA, he was being worked up and treated for pericarditis, AKI and elevated LFTs.  He is on lovenox and coumadin for LE DVT and PE.   Further PMH, PSH, meds, allergies can be found in resident note.   Physical Exam: Blood pressure 115/65, pulse 69, temperature 97.6 F (36.4 C), temperature source Oral, resp. rate 18, height 6\' 4"  (1.93 m), weight 236 lb 3.2 oz (107.14 kg), SpO2 95.00%. General appearance: alert, cooperative and no distress Head: Normocephalic, without obvious abnormality, atraumatic Eyes: PERRL, EOMI Lungs: clear to auscultation bilaterally and no wheezes Heart: RR, NR, no murmur noted, no rub Abdomen: soft, non-tender; bowel sounds normal; no masses,  no organomegaly Extremities: RLE swollen compared to right, no edema on left, no erythema Neurologic: Grossly normal  Lab results: Results for orders placed during the hospital encounter of 01/23/12 (from the past 24 hour(s))  CBC     Status: Abnormal   Collection Time   01/23/12   1:08 AM      Component Value Range   WBC 13.9 (*) 4.0 - 10.5 K/uL   RBC 5.15  4.22 - 5.81 MIL/uL   Hemoglobin 15.4  13.0 - 17.0 g/dL   HCT 78.2  95.6 - 21.3 %   MCV 86.0  78.0 - 100.0 fL   MCH 29.9  26.0 - 34.0 pg   MCHC 34.8  30.0 - 36.0 g/dL   RDW 08.6  57.8 - 46.9 %   Platelets 200  150 - 400 K/uL  BASIC METABOLIC PANEL     Status: Abnormal   Collection Time   01/23/12  1:08 AM      Component Value Range   Sodium 141  135 - 145 mEq/L   Potassium 4.0  3.5 - 5.1 mEq/L   Chloride 105  96 - 112 mEq/L   CO2 26  19 - 32 mEq/L   Glucose, Bld 107 (*) 70 - 99 mg/dL   BUN 19  6 - 23 mg/dL   Creatinine, Ser 6.29 (*) 0.50 - 1.35 mg/dL   Calcium 9.4  8.4 - 52.8 mg/dL   GFR calc non Af Amer 63 (*) >90 mL/min   GFR calc Af Amer 73 (*) >90 mL/min  PROTIME-INR     Status: Abnormal   Collection Time   01/23/12  1:08 AM      Component Value Range   Prothrombin Time 18.6 (*) 11.6 - 15.2 seconds   INR 1.61 (*) 0.00 - 1.49  POCT I-STAT TROPONIN I  Status: Normal   Collection Time   01/23/12  1:33 AM      Component Value Range   Troponin i, poc 0.00  0.00 - 0.08 ng/mL   Comment 3           HEPATIC FUNCTION PANEL     Status: Abnormal   Collection Time   01/23/12  1:53 AM      Component Value Range   Total Protein 7.4  6.0 - 8.3 g/dL   Albumin 4.0  3.5 - 5.2 g/dL   AST 562 (*) 0 - 37 U/L   ALT 312 (*) 0 - 53 U/L   Alkaline Phosphatase 89  39 - 117 U/L   Total Bilirubin 0.3  0.3 - 1.2 mg/dL   Bilirubin, Direct <1.3  0.0 - 0.3 mg/dL   Indirect Bilirubin NOT CALCULATED  0.3 - 0.9 mg/dL  URINALYSIS, ROUTINE W REFLEX MICROSCOPIC     Status: Abnormal   Collection Time   01/23/12  2:54 AM      Component Value Range   Color, Urine YELLOW  YELLOW   APPearance CLEAR  CLEAR   Specific Gravity, Urine 1.025  1.005 - 1.030   pH 5.5  5.0 - 8.0   Glucose, UA NEGATIVE  NEGATIVE mg/dL   Hgb urine dipstick NEGATIVE  NEGATIVE   Bilirubin Urine NEGATIVE  NEGATIVE   Ketones, ur 15 (*) NEGATIVE mg/dL    Protein, ur NEGATIVE  NEGATIVE mg/dL   Urobilinogen, UA 0.2  0.0 - 1.0 mg/dL   Nitrite NEGATIVE  NEGATIVE   Leukocytes, UA NEGATIVE  NEGATIVE  URINE RAPID DRUG SCREEN (HOSP PERFORMED)     Status: Abnormal   Collection Time   01/23/12  2:55 AM      Component Value Range   Opiates POSITIVE (*) NONE DETECTED   Cocaine POSITIVE (*) NONE DETECTED   Benzodiazepines NONE DETECTED  NONE DETECTED   Amphetamines NONE DETECTED  NONE DETECTED   Tetrahydrocannabinol POSITIVE (*) NONE DETECTED   Barbiturates NONE DETECTED  NONE DETECTED  BASIC METABOLIC PANEL     Status: Abnormal   Collection Time   01/23/12  4:29 AM      Component Value Range   Sodium 141  135 - 145 mEq/L   Potassium 3.8  3.5 - 5.1 mEq/L   Chloride 107  96 - 112 mEq/L   CO2 25  19 - 32 mEq/L   Glucose, Bld 99  70 - 99 mg/dL   BUN 18  6 - 23 mg/dL   Creatinine, Ser 0.86  0.50 - 1.35 mg/dL   Calcium 9.3  8.4 - 57.8 mg/dL   GFR calc non Af Amer 73 (*) >90 mL/min   GFR calc Af Amer 85 (*) >90 mL/min  CBC     Status: Abnormal   Collection Time   01/23/12  4:29 AM      Component Value Range   WBC 12.4 (*) 4.0 - 10.5 K/uL   RBC 4.99  4.22 - 5.81 MIL/uL   Hemoglobin 14.6  13.0 - 17.0 g/dL   HCT 46.9  62.9 - 52.8 %   MCV 85.4  78.0 - 100.0 fL   MCH 29.3  26.0 - 34.0 pg   MCHC 34.3  30.0 - 36.0 g/dL   RDW 41.3  24.4 - 01.0 %   Platelets 196  150 - 400 K/uL  HIV ANTIBODY (ROUTINE TESTING)     Status: Normal   Collection Time   01/23/12  4:29  AM      Component Value Range   HIV NON REACTIVE  NON REACTIVE  LIPID PANEL     Status: Abnormal   Collection Time   01/23/12  4:29 AM      Component Value Range   Cholesterol 155  0 - 200 mg/dL   Triglycerides 846  <962 mg/dL   HDL 36 (*) >95 mg/dL   Total CHOL/HDL Ratio 4.3     VLDL 25  0 - 40 mg/dL   LDL Cholesterol 94  0 - 99 mg/dL  HEPATIC FUNCTION PANEL     Status: Abnormal   Collection Time   01/23/12  4:29 AM      Component Value Range   Total Protein 6.9  6.0 - 8.3 g/dL    Albumin 3.6  3.5 - 5.2 g/dL   AST 86 (*) 0 - 37 U/L   ALT 277 (*) 0 - 53 U/L   Alkaline Phosphatase 83  39 - 117 U/L   Total Bilirubin 0.2 (*) 0.3 - 1.2 mg/dL   Bilirubin, Direct <2.8  0.0 - 0.3 mg/dL   Indirect Bilirubin NOT CALCULATED  0.3 - 0.9 mg/dL  TROPONIN I     Status: Normal   Collection Time   01/23/12  9:14 AM      Component Value Range   Troponin I <0.30  <0.30 ng/mL    Imaging results:   EKG: tachycardic, nonspecific TW changes, no ST depression/elevation, PR depression  Assessment and Plan: I agree with the formulated Assessment and Plan with the following changes:   1. Pleuritic chest pain - EKG no longer shows changes concerning for pericarditis.  This could have been a mild case of pericarditis which has resolved or could have been due to PEs from the beginning - TTE report was relatively normal - No rub on exam - Tramadol for pain - Lovenox and coumadin until INR 2-3 for treatment of PE  2. AKI - improved to baseline, unclear what inciting factor was (? Drug use) - Monitor while in house - Follow up in outpatient clinic  3. Elevated LFTs - Hepatitis panel negative, HIV negative - Further work up could include evaluation for hemachromatosis, autoimmune hepatitis, wilson's disease, hypothyroidism - As they are improving and the patient is clinically asymptomatic, will evaluate further in the outpatient setting.   - Appt for [facility] 01/26/12 with Dr. Dorthula Rue.    Inez Catalina, MD 11/4/20133:42 PM

## 2012-01-23 NOTE — Telephone Encounter (Signed)
Note patient readmitted.

## 2012-01-23 NOTE — ED Notes (Signed)
Pt reports was just recently in hospital for DVT and PE; pt reports that his cp and sob had gotten better, but was discharged today and started to get worse; pt having midsternal CP, SOB, and nausea
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
## 2012-01-23 NOTE — ED Notes (Signed)
Spoke with admitting MD regarding pt's request for stronger pain medication and states that Morphine did not help pain.  Advised to give pt NTG.

## 2012-01-23 NOTE — Progress Notes (Signed)
UR Completed Briauna Gilmartin Graves-Bigelow, RN,BSN [phone]  
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
## 2012-01-23 NOTE — Progress Notes (Signed)
Pt given discharge instructions and pt verbalized understanding of instructions.  Pt had no complaints of pain and vital signs within normal limits.   Pt insist upon ambulating out of building despite being advised that he could be wheelchair escorted out of building.

## 2012-01-24 ENCOUNTER — Ambulatory Visit: Payer: Self-pay | Admitting: Internal Medicine

## 2012-01-24 NOTE — Discharge Summary (Signed)
Please note that Mr. Vi left this hospital stay AMA despite risks of leaving the hospital prior to completed work up being explained to him.

## 2012-01-26 ENCOUNTER — Ambulatory Visit (INDEPENDENT_AMBULATORY_CARE_PROVIDER_SITE_OTHER): Payer: Self-pay | Admitting: Internal Medicine

## 2012-01-26 ENCOUNTER — Encounter: Payer: Self-pay | Admitting: Internal Medicine

## 2012-01-26 ENCOUNTER — Ambulatory Visit (INDEPENDENT_AMBULATORY_CARE_PROVIDER_SITE_OTHER): Payer: Self-pay | Admitting: Pharmacist

## 2012-01-26 VITALS — BP 124/83 | HR 80 | Temp 97.3°F | Ht 76.0 in | Wt 242.3 lb

## 2012-01-26 DIAGNOSIS — Z7901 Long term (current) use of anticoagulants: Secondary | ICD-10-CM

## 2012-01-26 DIAGNOSIS — F17200 Nicotine dependence, unspecified, uncomplicated: Secondary | ICD-10-CM

## 2012-01-26 DIAGNOSIS — I2699 Other pulmonary embolism without acute cor pulmonale: Secondary | ICD-10-CM

## 2012-01-26 DIAGNOSIS — R079 Chest pain, unspecified: Secondary | ICD-10-CM

## 2012-01-26 DIAGNOSIS — R55 Syncope and collapse: Secondary | ICD-10-CM

## 2012-01-26 LAB — POCT INR: INR: 2.5

## 2012-01-26 MED ORDER — WARFARIN SODIUM 7.5 MG PO TABS: 7.5000 mg | ORAL_TABLET | Freq: Every day | ORAL | Status: DC

## 2012-01-26 NOTE — Progress Notes (Signed)
Anti-Coagulation Progress Note  [name] is a 36 y.o. male who is currently on an anti-coagulation regimen.    RECENT RESULTS: Recent results are below, the most recent result is correlated with a dose of 52.5 mg. per week: Lab Results  Component Value Date   INR 2.5 01/26/2012   INR 1.61* 01/23/2012   INR 1.52* 01/22/2012    ANTI-COAG DOSE:   Latest dosing instructions   Total Sun Mon Tue Wed Thu Fri Sat   52.5 7.5 mg 7.5 mg 7.5 mg 7.5 mg 7.5 mg 7.5 mg 7.5 mg    (7.5 mg1) (7.5 mg1) (7.5 mg1) (7.5 mg1) (7.5 mg1) (7.5 mg1) (7.5 mg1)         ANTICOAG SUMMARY: Anticoagulation Episode Summary              Current INR goal 2.0-3.0 Next INR check 02/06/2012   INR from last check 2.5 (01/26/2012)     Weekly max dose (mg)  Target end date Indefinite   Indications PULMONARY EMBOLISM [415.19], Long term (current) use of anticoagulants [V58.61]   INR check location [facility]    Send INR reminders to    Comments             ANTICOAG TODAY: Anticoagulation Summary as of 01/26/2012              INR goal 2.0-3.0     Selected INR 2.5 (01/26/2012) Next INR check 02/06/2012   Weekly max dose (mg)  Target end date Indefinite   Indications PULMONARY EMBOLISM [415.19], Long term (current) use of anticoagulants [V58.61]    Anticoagulation Episode Summary              INR check location [facility]erred lab    Send INR reminders to    Comments             PATIENT INSTRUCTIONS: Patient Instructions  Patient instructed to take medications as defined in the Anti-coagulation Track section of this encounter.  Patient instructed to take today's dose.  Patient verbalized understanding of these instructions.        FOLLOW-UP Return in 11 days (on 02/06/2012) for Follow up INR at 1115h.  Hulen Luster, III Pharm.D., CACP

## 2012-01-26 NOTE — Progress Notes (Signed)
  Subjective:    Patient ID: [name], male    DOB: December 27, 1975, 36 y.o.   MRN: [id number]  HPI: 36 year old man with past medical history significant for recurrent VTE( PE in  2010 and then again in 2013 along with right DVT in 2013) likely in the setting of positive lupus anticoagulant presents to the clinic for a hospital followup.  He was recently hospitalized through 01/16/2012 to 01/17/2012 and then 01/20/2012 to  01/23/2012 where he left AMA on 01/22/2012 but got readmitted on 01/23/2012 for recurrent pulmonary embolism.  He presented with an episode of syncope in his first hospitalization and was confirmed to have right DVT and was discharged home on anticoagulation with presumed diagnosis of PE.  He returned on 01/20/2012 with complaints of chest pain and another syncopal event where PE was confirmed on CT - angio and he had extensive workup -he was ruled out for acute coronary syndrome. Of note his UDS was + cocaine in his 2nd hospitalization. His chest pain was thought to be likely secondary to recurrent PE versus cocaine-induced vasospasm. Denies any chest pain, SOB or new syncopal events.     Review of Systems  Constitutional: Negative for fever and chills.  HENT: Negative for congestion, sneezing and postnasal drip.   Eyes: Negative for visual disturbance.  Respiratory: Negative for cough and shortness of breath.   Cardiovascular: Negative for chest pain and leg swelling.  Gastrointestinal: Negative for nausea and blood in stool.  Neurological: Negative for facial asymmetry and headaches.       Objective:   Physical Exam  Constitutional: He is oriented to person, place, and time. He appears well-developed and well-nourished. No distress.  HENT:  Head: Normocephalic and atraumatic.  Eyes: Conjunctivae normal and EOM are normal. Pupils are equal, round, and reactive to light.  Neck: Normal range of motion. Neck supple. No JVD present. No tracheal deviation present. No  thyromegaly present.  Cardiovascular: Normal rate and regular rhythm.  Exam reveals no gallop and no friction rub.   No murmur heard. Pulmonary/Chest: Effort normal. No stridor. No respiratory distress. He has no wheezes.  Abdominal: Soft. Bowel sounds are normal. He exhibits no distension.  Musculoskeletal: Normal range of motion. He exhibits no edema and no tenderness.  Neurological: He is alert and oriented to person, place, and time. He has normal reflexes. He displays normal reflexes. No cranial nerve deficit. Coordination normal.  Skin: Skin is warm. He is not diaphoretic.          Assessment & Plan:

## 2012-01-26 NOTE — Assessment & Plan Note (Signed)
Hospital followup for chest pain likely secondary to recurrent PE versus cocaine-induced vasospasm. Denies any chest discomfort at today's visit. -Continue to monitor for now.

## 2012-01-26 NOTE — Patient Instructions (Addendum)
Please schedule a follow up appointment in 3 months. Please bring your medication bottles with your next appointment. Please take your medicines as prescribed.

## 2012-01-26 NOTE — Assessment & Plan Note (Signed)
No further syncopal events.

## 2012-01-26 NOTE — Assessment & Plan Note (Signed)
Counseled him on tobacco cessation. He doesn't seem to be interested in quitting at this time.

## 2012-01-26 NOTE — Assessment & Plan Note (Signed)
His INR today was 2.5. He was seen by Dr. Alexandria Lodge for further instructions on his Coumadin dosage.

## 2012-01-26 NOTE — Patient Instructions (Addendum)
Patient instructed to take medications as defined in the Anti-coagulation Track section of this encounter.  Patient instructed to take today's dose.  Patient verbalized understanding of these instructions.    

## 2012-01-30 ENCOUNTER — Ambulatory Visit: Payer: Self-pay

## 2012-02-06 ENCOUNTER — Ambulatory Visit: Payer: Self-pay

## 2012-02-19 ENCOUNTER — Encounter (HOSPITAL_COMMUNITY): Payer: Self-pay

## 2012-02-19 DIAGNOSIS — Z86718 Personal history of other venous thrombosis and embolism: Secondary | ICD-10-CM | POA: Insufficient documentation

## 2012-02-19 DIAGNOSIS — Z7901 Long term (current) use of anticoagulants: Secondary | ICD-10-CM | POA: Insufficient documentation

## 2012-02-19 DIAGNOSIS — Z86711 Personal history of pulmonary embolism: Secondary | ICD-10-CM | POA: Insufficient documentation

## 2012-02-19 DIAGNOSIS — F17200 Nicotine dependence, unspecified, uncomplicated: Secondary | ICD-10-CM | POA: Insufficient documentation

## 2012-02-19 DIAGNOSIS — D689 Coagulation defect, unspecified: Secondary | ICD-10-CM | POA: Insufficient documentation

## 2012-02-19 NOTE — ED Notes (Signed)
Pt complains of bleeding gums since this am when he woke up and also reports swelling in lower right leg and warm to touch, pt recently diagnosed with dvt in same leg and is on coumadin. Last level was checked in nov

## 2012-02-20 ENCOUNTER — Emergency Department (HOSPITAL_COMMUNITY)
Admission: EM | Admit: 2012-02-20 | Discharge: 2012-02-20 | Disposition: A | Discharge status: Home / Self Care | Payer: Self-pay | Attending: Emergency Medicine | Admitting: Emergency Medicine

## 2012-02-20 DIAGNOSIS — D6832 Hemorrhagic disorder due to extrinsic circulating anticoagulants: Secondary | ICD-10-CM

## 2012-02-20 LAB — PROTIME-INR
INR: 4.87 — ABNORMAL HIGH (ref 0.00–1.49)
Prothrombin Time: 42.3 seconds — ABNORMAL HIGH (ref 11.6–15.2)

## 2012-02-20 MED ORDER — WARFARIN SODIUM 5 MG PO TABS: 5.0000 mg | ORAL_TABLET | Freq: Every day | ORAL | Status: DC

## 2012-02-20 NOTE — ED Notes (Signed)
Per nurses orders, obtained and gave the patient a tea bag to assist in bleeding control of an abscess tooth. Cleaned room after patient left.

## 2012-02-20 NOTE — ED Provider Notes (Signed)
History     CSN: [id number]  Arrival date & time 02/19/12  2342   First MD Initiated Contact with Patient 02/20/12 0026      Chief Complaint  Patient presents with  . Coagulation Disorder    (Consider location/radiation/quality/duration/timing/severity/associated sxs/prior treatment) HPI Comments: Pt presents with c/o bleeding gums -s tarted 18 hours ago - noted that it has been improving slowly throughout the day - he was recently admitted to the hospital for small PE and hx of DVT 4 weeks ago at which time he was bridged back onto coumadin (life as he is lupus anticoag + and has had PE X several in past).  He has not started any other new medicines.  The bleeding is mild at this time.  He takes 7.5mg  of Coumadin daily - he held his dose for today.    The history is provided by the patient and medical records.    Past Medical History  Diagnosis Date  . PE (pulmonary embolism) 2011; 07/2011  . Syncope and collapse 01/16/2012    "woke up to dog licking my face" (01/16/2012)  . 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 Cocaine abuse   . Family history of early CAD     Father deceased at 77 yo with MI  . Tobacco abuse     Past Surgical History  Procedure Date  . No past surgeries     Family History  Problem Relation Age of Onset  . Coronary artery disease Father 84    MI at age 50  . Colon cancer Paternal Grandmother   . Cancer Maternal Grandfather     History  Substance Use Topics  . Smoking status: Current Every Day Smoker -- 0.5 packs/day for 5 years    Types: Cigarettes  . Smokeless tobacco: Never Used  . Alcohol Use: Yes     Comment: occasion      Review of Systems  Constitutional: Negative for fever and chills.  Respiratory: Negative for cough and shortness of breath.   Cardiovascular: Negative for chest pain.  Gastrointestinal: Negative for nausea and vomiting.  Musculoskeletal: Negative for back pain.  Hematological: Bruises/bleeds  easily.    Allergies  Review of patient's allergies indicates no known allergies.  Home Medications   Current Outpatient Rx  Name  Route  Sig  Dispense  Refill  . WARFARIN SODIUM 7.5 MG PO TABS   Oral   Take 1 tablet (7.5 mg total) by mouth daily at 6 PM.   31 tablet   2   . NITROGLYCERIN 0.4 MG SL SUBL   Sublingual   Place 1 tablet (0.4 mg total) under the tongue every 5 (five) minutes x 3 doses as needed for chest pain.   30 tablet   1     BP 138/87  Pulse 82  Temp 98.1 F (36.7 C) (Oral)  Resp 18  SpO2 96%  Physical Exam  Nursing note and vitals reviewed. Constitutional: He appears well-developed and well-nourished. No distress.  HENT:  Head: Normocephalic and atraumatic.       Poor dentition, isolated eroded tooth, rear lower left molar.  Has scant, barely visible am't of bleeding around base of tooth.  Eyes: Conjunctivae normal are normal. No scleral icterus.  Neck: Normal range of motion.  Pulmonary/Chest: Effort normal.  Musculoskeletal: Normal range of motion.  Neurological: He is alert. Coordination normal.  Skin: Skin is warm and dry. No rash noted. He is not diaphoretic.  ED Course  Procedures (including critical care time)  Labs Reviewed  PROTIME-INR - Abnormal; Notable for the following:    Prothrombin Time 42.3 (*)     INR 4.87 (*)     All other components within normal limits   No results found.   1. Warfarin-induced coagulopathy       MDM  At this time the patient appears well, he is a very slight scant bleeding around the base of the left lower molar, will place Tea bag, I am not going to give her bursal therapy due to the very mild nature of this bleed however I have recommended that he hold his Coumadin for Monday and restart on Tuesday, I have also recommended a change in his dosing regimen and to followup with his family Dr.        Vida Roller, MD 02/20/12 [phone]

## 2012-05-29 ENCOUNTER — Other Ambulatory Visit: Payer: Self-pay | Admitting: *Deleted

## 2012-05-30 NOTE — Telephone Encounter (Signed)
Resent to Dr Alexandria Lodge per Dr Dorthula Rue.

## 2012-06-01 ENCOUNTER — Other Ambulatory Visit: Payer: Self-pay | Admitting: Internal Medicine

## 2012-06-04 ENCOUNTER — Ambulatory Visit: Payer: Self-pay

## 2012-08-12 ENCOUNTER — Emergency Department (HOSPITAL_COMMUNITY)
Admission: EM | Admit: 2012-08-12 | Discharge: 2012-08-12 | Disposition: A | Discharge status: Home / Self Care | Payer: Self-pay | Attending: Emergency Medicine | Admitting: Emergency Medicine

## 2012-08-12 ENCOUNTER — Emergency Department (HOSPITAL_COMMUNITY): Payer: Self-pay

## 2012-08-12 ENCOUNTER — Encounter (HOSPITAL_COMMUNITY): Payer: Self-pay | Admitting: Emergency Medicine

## 2012-08-12 DIAGNOSIS — R0602 Shortness of breath: Secondary | ICD-10-CM | POA: Insufficient documentation

## 2012-08-12 DIAGNOSIS — R791 Abnormal coagulation profile: Secondary | ICD-10-CM

## 2012-08-12 DIAGNOSIS — F1210 Cannabis abuse, uncomplicated: Secondary | ICD-10-CM | POA: Insufficient documentation

## 2012-08-12 DIAGNOSIS — Z7901 Long term (current) use of anticoagulants: Secondary | ICD-10-CM | POA: Insufficient documentation

## 2012-08-12 DIAGNOSIS — F1410 Cocaine abuse, uncomplicated: Secondary | ICD-10-CM | POA: Insufficient documentation

## 2012-08-12 DIAGNOSIS — Z86711 Personal history of pulmonary embolism: Secondary | ICD-10-CM | POA: Insufficient documentation

## 2012-08-12 DIAGNOSIS — Z86718 Personal history of other venous thrombosis and embolism: Secondary | ICD-10-CM | POA: Insufficient documentation

## 2012-08-12 DIAGNOSIS — F17200 Nicotine dependence, unspecified, uncomplicated: Secondary | ICD-10-CM | POA: Insufficient documentation

## 2012-08-12 DIAGNOSIS — R079 Chest pain, unspecified: Secondary | ICD-10-CM | POA: Insufficient documentation

## 2012-08-12 LAB — CBC
MCV: 86.5 fL (ref 78.0–100.0)
Platelets: 145 10*3/uL — ABNORMAL LOW (ref 150–400)
RBC: 4.73 MIL/uL (ref 4.22–5.81)
RDW: 13.7 % (ref 11.5–15.5)
WBC: 15.3 10*3/uL — ABNORMAL HIGH (ref 4.0–10.5)

## 2012-08-12 LAB — BASIC METABOLIC PANEL
CO2: 24 mEq/L (ref 19–32)
Chloride: 106 mEq/L (ref 96–112)
Creatinine, Ser: 1.57 mg/dL — ABNORMAL HIGH (ref 0.50–1.35)
GFR calc Af Amer: 64 mL/min — ABNORMAL LOW (ref >90)
Potassium: 3.6 mEq/L (ref 3.5–5.1)
Sodium: 139 mEq/L (ref 135–145)

## 2012-08-12 LAB — PROTIME-INR
INR: 1.01 (ref 0.00–1.49)
Prothrombin Time: 13.2 seconds (ref 11.6–15.2)

## 2012-08-12 MED ORDER — LORAZEPAM 2 MG/ML IJ SOLN
1.0000 mg | Freq: Once | INTRAMUSCULAR | Status: AC
  Administered 2012-08-12: 1 mg via INTRAVENOUS
  Filled 2012-08-12: qty 1

## 2012-08-12 MED ORDER — ENOXAPARIN SODIUM 100 MG/ML ~~LOC~~ SOLN
110.0000 mg | Freq: Once | SUBCUTANEOUS | Status: DC
  Filled 2012-08-12: qty 2

## 2012-08-12 MED ORDER — IOHEXOL 350 MG/ML SOLN
75.0000 mL | Freq: Once | INTRAVENOUS | Status: AC | PRN
  Administered 2012-08-12: 75 mL via INTRAVENOUS

## 2012-08-12 MED ORDER — ENOXAPARIN SODIUM 100 MG/ML ~~LOC~~ SOLN: 110.0000 mg | Freq: Every day | SUBCUTANEOUS | Status: DC

## 2012-08-12 MED ORDER — OXYCODONE-ACETAMINOPHEN 5-325 MG PO TABS
1.0000 | ORAL_TABLET | Freq: Once | ORAL | Status: AC
  Administered 2012-08-12: 1 via ORAL
  Filled 2012-08-12: qty 1

## 2012-08-12 MED ORDER — ENOXAPARIN SODIUM 120 MG/0.8ML ~~LOC~~ SOLN
110.0000 mg | Freq: Once | SUBCUTANEOUS | Status: AC
  Administered 2012-08-12: 110 mg via SUBCUTANEOUS

## 2012-08-12 NOTE — ED Provider Notes (Signed)
History     CSN: [id number]  Arrival date & time 08/12/12  0440   First MD Initiated Contact with Patient 08/12/12 [phone]      Chief Complaint  Patient presents with  . Chest Pain    The history is provided by the patient.   patient is 37 years old and has a history of DVT as well as pulmonary embolism.  Is on Coumadin his.  He presents today with chest tightness and pressure that awoke him from sleep.  Some shortness of breath.  O2 sats are 92% on room air.  He states the last time his INR was checked was a month ago and was fine at that time.  He denies a history of asthma or COPD.  No cough or recent congestion or illness.  No fevers or chills.  No abdominal pain.  No nausea vomiting or diarrhea.  His symptoms are mild to moderate in severity.  He does have worsening discomfort in his chest with deep breathing.  Nothing improves his pain.  His a family history of early cardiac disease but he has no prior history of cardiac disease, cardiac catheter or cardiac stress testing.  He continues to smoke cigarettes.  His a prior history of cocaine abuse but states he no longer uses cocaine.  His urine drug screen was + for cocaine 6 months ago.  Past Medical History  Diagnosis Date  . PE (pulmonary embolism) 2011; 07/2011  . Syncope and collapse 01/16/2012    "woke up to dog licking my face" (01/16/2012)  . 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 Cocaine abuse   . Family history of early CAD     Father deceased at 36 yo with MI  . Tobacco abuse     Past Surgical History  Procedure Laterality Date  . No past surgeries      Family History  Problem Relation Age of Onset  . Coronary artery disease Father 48    MI at age 39  . Colon cancer Paternal Grandmother   . Cancer Maternal Grandfather     History  Substance Use Topics  . Smoking status: Current Every Day Smoker -- 0.50 packs/day for 5 years    Types: Cigarettes  . Smokeless tobacco: Never Used  . Alcohol  Use: Yes     Comment: occasion      Review of Systems  All other systems reviewed and are negative.    Allergies  Review of patient's allergies indicates no known allergies.  Home Medications   Current Outpatient Rx  Name  Route  Sig  Dispense  Refill  . nitroGLYCERIN (NITROSTAT) 0.4 MG SL tablet   Sublingual   Place 1 tablet (0.4 mg total) under the tongue every 5 (five) minutes x 3 doses as needed for chest pain.   30 tablet   1   . warfarin (COUMADIN) 5 MG tablet   Oral   Take 1 tablet (5 mg total) by mouth daily.   30 tablet   1   . warfarin (COUMADIN) 7.5 MG tablet      TAKE 1 TABLET BY MOUTH DAILY AT 6 PM   31 tablet   0     BP 140/73  Pulse 110  Temp(Src) 99 F (37.2 C) (Oral)  Resp 18  SpO2 96%  Physical Exam  Nursing note and vitals reviewed. Constitutional: He is oriented to person, place, and time. He appears well-developed and well-nourished.  HENT:  Head: Normocephalic and atraumatic.  Eyes: EOM are normal.  Neck: Normal range of motion.  Cardiovascular: Regular rhythm, normal heart sounds and intact distal pulses.   Tachycardia  Pulmonary/Chest: Effort normal and breath sounds normal. No respiratory distress.  Abdominal: Soft. He exhibits no distension. There is no tenderness.  Genitourinary: Rectum normal.  Musculoskeletal: Normal range of motion. He exhibits no edema and no tenderness.  Neurological: He is alert and oriented to person, place, and time.  Skin: Skin is warm and dry.  Psychiatric: He has a normal mood and affect. Judgment normal.    ED Course  Procedures (including critical care time)   Date: 08/12/2012  Rate: 113  Rhythm: normal sinus rhythm  QRS Axis: normal  Intervals: normal  ST/T Wave abnormalities: normal  Conduction Disutrbances: none  Narrative Interpretation:   Old EKG Reviewed: No significant changes noted     Labs Reviewed  CBC - Abnormal; Notable for the following:    WBC 15.3 (*)    Platelets  145 (*)    All other components within normal limits  BASIC METABOLIC PANEL - Abnormal; Notable for the following:    Glucose, Bld 127 (*)    Creatinine, Ser 1.57 (*)    GFR calc non Af Amer 56 (*)    GFR calc Af Amer 64 (*)    All other components within normal limits  PROTIME-INR  TROPONIN I   Dg Chest 2 View  08/12/2012   *RADIOLOGY REPORT*  Clinical Data: Chest pain; history of smoking.  CHEST - 2 VIEW  Comparison: Chest radiograph and CTA of the chest performed 01/20/2012  Findings: The lungs are well-aerated and clear.  There is no evidence of focal opacification, pleural effusion or pneumothorax.  The heart is normal in size; the mediastinal contour is within normal limits.  No acute osseous abnormalities are seen.  IMPRESSION: No acute cardiopulmonary process seen.   Original Report Authenticated By: Tonia Ghent, M.D.   Ct Angio Chest W/cm &/or Wo Cm  08/12/2012   *RADIOLOGY REPORT*  Clinical Data: Chest pain  CT ANGIOGRAPHY CHEST  Technique:  Multidetector CT imaging of the chest using the standard protocol during bolus administration of intravenous contrast. Multiplanar reconstructed images including MIPs were obtained and reviewed to evaluate the vascular anatomy.  Contrast: 75mL OMNIPAQUE IOHEXOL 350 MG/ML SOLN  Comparison: 01/20/2012  Findings: There are no filling defects in the pulmonary arterial tree to suggest acute pulmonary thromboembolism.  Mixing artifact and pulmonary veins is noted.  Small mediastinal nodes.  No pericardial effusion.  Minimal peribronchovascular soft tissue thickening in the hilar regions.  Dependent atelectasis at the lung bases.  Shallow 04/1976 disc herniation effaces the anterior thecal sac.  No pneumothorax or pleural effusion.  Visualized upper abdomen is benign.  IMPRESSION: No evidence of acute pulmonary thromboembolism.  Mid thoracic disc herniation as described.   Original Report Authenticated By: Jolaine Click, M.D.     1. Chest pain   2.  Subtherapeutic international normalized ratio (INR)       MDM  Patient subtherapeutic from an INR standpoint.  Subcutaneous Lovenox now.  Patient will undergo CT image of his chest to evaluate for PE.  Doubt ACS.  EKG normal sinus rhythm.  Troponin normal.  The patient does have prior history of cocaine abuse and some of this may be related.  He denies cocaine abuse to me.  Urine drug screen pending.  CT without evidence of pulmonary embolism.  Patient be discharged home with a  prescription for Lovenox and instructions to followup with his primary care physician closely for recheck of his INR.  He states he is compliant with his Coumadin.        Lyanne Co, MD 08/13/12 [phone]

## 2012-08-12 NOTE — ED Notes (Signed)
Pt discharged to home with family. NAD.  

## 2012-08-12 NOTE — ED Notes (Signed)
Patient with shortness of breath and chest pain that woke him from sleep.  Patient being treated with Coumadin for DVT.  Patient received 324mg  of ASA and one sl nitro, with no relief.  Patient was O2 sat of 92% on room air.  Patient is CAOx3.

## 2013-02-18 ENCOUNTER — Encounter: Payer: Self-pay | Admitting: Internal Medicine

## 2013-04-22 ENCOUNTER — Encounter: Payer: Self-pay | Admitting: Internal Medicine

## 2013-08-01 ENCOUNTER — Emergency Department (HOSPITAL_COMMUNITY): Payer: Self-pay

## 2013-08-01 ENCOUNTER — Encounter (HOSPITAL_COMMUNITY): Payer: Self-pay | Admitting: Emergency Medicine

## 2013-08-01 ENCOUNTER — Emergency Department (HOSPITAL_COMMUNITY)
Admission: EM | Admit: 2013-08-01 | Discharge: 2013-08-01 | Discharge status: Against Medical Advice | Payer: Self-pay | Attending: Emergency Medicine | Admitting: Emergency Medicine

## 2013-08-01 DIAGNOSIS — M7981 Nontraumatic hematoma of soft tissue: Secondary | ICD-10-CM | POA: Insufficient documentation

## 2013-08-01 DIAGNOSIS — R079 Chest pain, unspecified: Secondary | ICD-10-CM | POA: Insufficient documentation

## 2013-08-01 DIAGNOSIS — Z86711 Personal history of pulmonary embolism: Secondary | ICD-10-CM | POA: Insufficient documentation

## 2013-08-01 DIAGNOSIS — R0602 Shortness of breath: Secondary | ICD-10-CM | POA: Insufficient documentation

## 2013-08-01 DIAGNOSIS — F17200 Nicotine dependence, unspecified, uncomplicated: Secondary | ICD-10-CM | POA: Insufficient documentation

## 2013-08-01 DIAGNOSIS — R202 Paresthesia of skin: Secondary | ICD-10-CM

## 2013-08-01 DIAGNOSIS — Z86718 Personal history of other venous thrombosis and embolism: Secondary | ICD-10-CM | POA: Insufficient documentation

## 2013-08-01 DIAGNOSIS — R209 Unspecified disturbances of skin sensation: Secondary | ICD-10-CM | POA: Insufficient documentation

## 2013-08-01 LAB — COMPREHENSIVE METABOLIC PANEL
ALT: 51 U/L (ref 0–53)
AST: 35 U/L (ref 0–37)
Albumin: 3.8 g/dL (ref 3.5–5.2)
Alkaline Phosphatase: 54 U/L (ref 39–117)
BUN: 10 mg/dL (ref 6–23)
CALCIUM: 9.4 mg/dL (ref 8.4–10.5)
CO2: 25 mEq/L (ref 19–32)
CREATININE: 1.4 mg/dL — AB (ref 0.50–1.35)
Chloride: 105 mEq/L (ref 96–112)
GFR, EST AFRICAN AMERICAN: 74 mL/min — AB (ref >90)
GFR, EST NON AFRICAN AMERICAN: 63 mL/min — AB (ref >90)
GLUCOSE: 113 mg/dL — AB (ref 70–99)
Potassium: 4 mEq/L (ref 3.7–5.3)
SODIUM: 144 meq/L (ref 137–147)
TOTAL PROTEIN: 6.8 g/dL (ref 6.0–8.3)
Total Bilirubin: 0.4 mg/dL (ref 0.3–1.2)

## 2013-08-01 LAB — CBC WITH DIFFERENTIAL/PLATELET
Basophils Absolute: 0 10*3/uL (ref 0.0–0.1)
Basophils Relative: 0 % (ref 0–1)
EOS ABS: 0.1 10*3/uL (ref 0.0–0.7)
EOS PCT: 2 % (ref 0–5)
HEMATOCRIT: 42.4 % (ref 39.0–52.0)
Hemoglobin: 14.4 g/dL (ref 13.0–17.0)
LYMPHS ABS: 1.7 10*3/uL (ref 0.7–4.0)
Lymphocytes Relative: 21 % (ref 12–46)
MCH: 29.5 pg (ref 26.0–34.0)
MCHC: 34 g/dL (ref 30.0–36.0)
MCV: 86.9 fL (ref 78.0–100.0)
MONO ABS: 1 10*3/uL (ref 0.1–1.0)
MONOS PCT: 12 % (ref 3–12)
Neutro Abs: 5.4 10*3/uL (ref 1.7–7.7)
Neutrophils Relative %: 65 % (ref 43–77)
PLATELETS: 148 10*3/uL — AB (ref 150–400)
RBC: 4.88 MIL/uL (ref 4.22–5.81)
RDW: 14.3 % (ref 11.5–15.5)
WBC: 8.4 10*3/uL (ref 4.0–10.5)

## 2013-08-01 LAB — PROTIME-INR
INR: 0.97 (ref 0.00–1.49)
PROTHROMBIN TIME: 12.7 s (ref 11.6–15.2)

## 2013-08-01 LAB — TROPONIN I

## 2013-08-01 MED ORDER — SODIUM CHLORIDE 0.9 % IV BOLUS (SEPSIS)
500.0000 mL | INTRAVENOUS | Status: AC
  Administered 2013-08-01: 500 mL via INTRAVENOUS

## 2013-08-01 MED ORDER — IOHEXOL 350 MG/ML SOLN
100.0000 mL | Freq: Once | INTRAVENOUS | Status: AC | PRN
  Administered 2013-08-01: 100 mL via INTRAVENOUS

## 2013-08-01 NOTE — ED Notes (Signed)
38 yo male with chief complaint of Right Lower Ext swelling with hx of DVT in that same leg. Reports the leg has began to swell and bruise over the past 2 days, also experiencing numbness in that leg. Sob with inspiration describes feeling a pinch on inspiration. A/O x4 pain 7/10. Vitals Stable.

## 2013-08-01 NOTE — ED Notes (Signed)
Came in room to make introduction and advise patient of transition of care when patient stated he wanted to leave; pt stated that he had a lot going on and that he needed to go; IV removed/dc'd and pt was advised that if [facility] had continued or worsening symptoms [facility]. Left before signature could be obtained.

## 2013-08-01 NOTE — ED Notes (Signed)
MD at bedside. 

## 2013-08-01 NOTE — ED Notes (Signed)
Patient transported to CT 

## 2013-08-01 NOTE — ED Notes (Signed)
Patient transported to X-ray 

## 2013-08-01 NOTE — ED Notes (Signed)
Pt tearful. States that he's had a relapse of Cocaine this week.

## 2013-08-01 NOTE — ED Provider Notes (Signed)
CSN: [id number]     Arrival date & time 08/01/13  [id number] History   First MD Initiated Contact with Patient 08/01/13 [phone]     Chief Complaint  Patient presents with  . Leg Swelling    Right Leg  . Shortness of Breath     (Consider location/radiation/quality/duration/timing/severity/associated sxs/prior Treatment) Patient is a 38 y.o. male presenting with chest pain. The history is provided by the patient.  Chest Pain Pain location:  R chest Pain quality: sharp   Pain radiates to:  Does not radiate Pain radiates to the back: no   Pain severity:  Mild Onset quality:  Gradual Duration:  2 days Timing:  Intermittent Progression:  Unchanged Chronicity:  New Context: breathing   Relieved by:  Nothing Worsened by:  Nothing tried Ineffective treatments:  None tried Associated symptoms: no nausea and no numbness     Past Medical History  Diagnosis Date  . PE (pulmonary embolism) 2011; 07/2011  . Syncope and collapse 01/16/2012    "woke up to dog licking my face" (01/16/2012)  . 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 Cocaine abuse   . Family history of early CAD     Father deceased at 36 yo with MI  . Tobacco abuse    Past Surgical History  Procedure Laterality Date  . No past surgeries     Family History  Problem Relation Age of Onset  . Coronary artery disease Father [id number]    MI at age 36  . Colon cancer Paternal Grandmother   . Cancer Maternal Grandfather    History  Substance Use Topics  . Smoking status: Current Every Day Smoker -- 0.50 packs/day for 5 years    Types: Cigarettes  . Smokeless tobacco: Never Used  . Alcohol Use: Yes     Comment: occasion    Review of Systems  HENT: Negative for drooling and rhinorrhea.   Eyes: Negative for pain.  Cardiovascular: Positive for chest pain. Negative for leg swelling.  Gastrointestinal: Negative for nausea and diarrhea.  Genitourinary: Negative for dysuria and hematuria.  Musculoskeletal: Negative  for gait problem.  Skin: Negative for color change.  Neurological: Negative for numbness.  Hematological: Negative for adenopathy.  Psychiatric/Behavioral: Negative for behavioral problems.  All other systems reviewed and are negative.     Allergies  Review of patient's allergies indicates no known allergies.  Home Medications   Prior to Admission medications   Not on File   BP 146/83  Pulse 94  Temp(Src) 97.9 F (36.6 C) (Oral)  Resp 17  SpO2 97% Physical Exam  Nursing note and vitals reviewed. Constitutional: He is oriented to person, place, and time. He appears well-developed and well-nourished.  HENT:  Head: Normocephalic and atraumatic.  Right Ear: External ear normal.  Left Ear: External ear normal.  Nose: Nose normal.  Mouth/Throat: Oropharynx is clear and moist. No oropharyngeal exudate.  Eyes: Conjunctivae and EOM are normal. Pupils are equal, round, and reactive to light.  Neck: Normal range of motion. Neck supple.  Cardiovascular: Normal rate, regular rhythm, normal heart sounds and intact distal pulses.  Exam reveals no gallop and no friction rub.   No murmur heard. Pulmonary/Chest: Effort normal and breath sounds normal. No respiratory distress. He has no wheezes.  Abdominal: Soft. Bowel sounds are normal. He exhibits no distension. There is no tenderness. There is no rebound and no guarding.  Musculoskeletal: Normal range of motion. He exhibits no edema and no tenderness.  2+ distal pulses in all extremities.  Normal appearance of bilateral lower extremities without asymmetry. No focal tenderness to palpation of the lower extremities.  Normal strength and sensation in bilateral lower extremities.  Some mild nonspecific bruising is noted circumferentially around the ankles bilaterally.  LE's are warm and well perfused. <2 sec cap refill.   Neurological: He is alert and oriented to person, place, and time.  Skin: Skin is warm and dry.  Psychiatric: He  has a normal mood and affect. His behavior is normal.    ED Course  Procedures (including critical care time) Labs Review Labs Reviewed  CBC WITH DIFFERENTIAL  COMPREHENSIVE METABOLIC PANEL  TROPONIN I  PROTIME-INR    Imaging Review Dg Chest 2 View  08/01/2013   CLINICAL DATA:  Right lower chest pain, history of PE  EXAM: CHEST  2 VIEW  COMPARISON:  CTA chest dated 08/12/2012  FINDINGS: Lungs are clear.  No pleural effusion or pneumothorax.  The heart is normal in size.  Visualized osseous structures are within normal limits.  IMPRESSION: Normal chest radiographs.   Electronically Signed   By: Charline BillsSriyesh  Krishnan M.D.   On: 08/01/2013 08:28   Ct Angio Chest Pe W/cm &/or Wo Cm  08/01/2013   CLINICAL DATA:  Right lower inspiratory chest pain.  EXAM: CT ANGIOGRAPHY CHEST WITH CONTRAST  TECHNIQUE: Multidetector CT imaging of the chest was performed using the standard protocol during bolus administration of intravenous contrast. Multiplanar CT image reconstructions and MIPs were obtained to evaluate the vascular anatomy.  CONTRAST:  100mL OMNIPAQUE IOHEXOL 350 MG/ML SOLN  COMPARISON:  DG CHEST 2 VIEW dated 08/01/2013; CT ANGIO CHEST W/CM &/OR WO/CM dated 08/12/2012  FINDINGS: Contrast within the pulmonary arterial tree is normal in appearance. There are no filling defects to suggest an acute pulmonary embolism. The caliber of the thoracic aorta is normal. There is no evidence of a false lumen. The cardiac chambers are normal in size. There is no pleural nor pericardial effusion. There is no mediastinal nor hilar lymphadenopathy. The retrosternal soft tissues appear normal.  At lung window settings there is parenchymal density in the inferior medial aspect of the right lower lobe adjacent to the thoracic spine. This may reflect atelectasis or pneumonia. This is in an area of preexisting mild scarring. There is minimal increased density in the lingula which is not clearly new. There is no pneumothorax or  pneumomediastinum.  The sternum, thoracic spine, and observed portions of the ribs exhibit no acute abnormalities.  Within the upper abdomen the observed portions of the liver and spleen and adrenal glands appear normal. The gallbladder is contracted.  Review of the MIP images confirms the above findings.  IMPRESSION: 1. There is no evidence of an acute pulmonary embolism nor of acute thoracic aortic pathology 2. There is increased parenchymal density in the right lower lobe in the paravertebral region which may reflect pneumonia or atelectasis. This has become more conspicuous than in the past. Minimal density in the lingula is not clearly new. 3. There is no evidence of CHF. There is no mediastinal or hilar lymphadenopathy.   Electronically Signed   By: David  Swazil[place]Jordan   On: 08/01/2013 09:49     EKG Interpretation   Date/Time:  Thursday Aug 01 2013 07:32:23 EDT Ventricular Rate:  93 PR Interval:  164 QRS Duration: 71 QT Interval:  336 QTC Calculation: 418 R Axis:   28 Text Interpretation:  Age not entered, assumed to be  38 years old for  purpose of ECG interpretation Sinus rhythm Probable left atrial  enlargement Abnormal R-wave progression, early transition Borderline ST  elevation, inferior leads Confirmed by Phylis Javed  MD, Ginnie Marich ([id number]) on  08/01/2013 7:40:20 AM      MDM   Final diagnoses:  Chest pain  Paresthesia of right leg    7:59 AM 38 y.o. male with history of DVT and PE who presents with right leg paresthesias and pain with inspiration consistent with his previous PE. He states that he quit taking his Coumadin approximately one year ago and has not followed up with any physician. He states that he began feeling some paresthesias on the posterior aspect of his right calf about 2 weeks ago. He denies any pain or swelling in his lower extremities but has seen some mild bruising in the right posterior calf. He denies any shortness of breath that began having a pinching sensation in  his right lower chest with deep inspiration approximately 2 days ago. He is afebrile and vital signs are unremarkable here. His oxygen saturations 90% on room air and he has no increased worker breathing. Will get screening labwork and CT of chest.  Pt admits to cocaine and etoh use this morning.   Planned for [place] to r/o DVT. Pt eloped w/out warning prior to this. CTA was neg for PE.   Junius Argyle, MD 08/01/13 2036

## 2013-08-02 NOTE — Discharge Planning (Signed)
[facility] Community Liaison  I had spoke to the patient while in the ED about establishing care with a provider. Attempted to make the patient a follow up appointment but patient was discharged before appointment could be given. Follow up information will be sent to the address listed [facility].

## 2014-04-09 ENCOUNTER — Encounter (HOSPITAL_COMMUNITY): Payer: Self-pay | Admitting: Emergency Medicine

## 2014-04-09 ENCOUNTER — Emergency Department (HOSPITAL_COMMUNITY)
Admission: EM | Admit: 2014-04-09 | Discharge: 2014-04-09 | Disposition: A | Discharge status: Home / Self Care | Payer: Self-pay | Attending: Emergency Medicine | Admitting: Emergency Medicine

## 2014-04-09 ENCOUNTER — Other Ambulatory Visit: Payer: Self-pay

## 2014-04-09 DIAGNOSIS — I82409 Acute embolism and thrombosis of unspecified deep veins of unspecified lower extremity: Secondary | ICD-10-CM

## 2014-04-09 DIAGNOSIS — I82401 Acute embolism and thrombosis of unspecified deep veins of right lower extremity: Secondary | ICD-10-CM | POA: Insufficient documentation

## 2014-04-09 DIAGNOSIS — Z7901 Long term (current) use of anticoagulants: Secondary | ICD-10-CM | POA: Insufficient documentation

## 2014-04-09 DIAGNOSIS — Z86711 Personal history of pulmonary embolism: Secondary | ICD-10-CM | POA: Insufficient documentation

## 2014-04-09 DIAGNOSIS — F17200 Nicotine dependence, unspecified, uncomplicated: Secondary | ICD-10-CM

## 2014-04-09 DIAGNOSIS — F1410 Cocaine abuse, uncomplicated: Secondary | ICD-10-CM | POA: Insufficient documentation

## 2014-04-09 DIAGNOSIS — Z720 Tobacco use: Secondary | ICD-10-CM | POA: Insufficient documentation

## 2014-04-09 DIAGNOSIS — I2510 Atherosclerotic heart disease of native coronary artery without angina pectoris: Secondary | ICD-10-CM | POA: Insufficient documentation

## 2014-04-09 LAB — BASIC METABOLIC PANEL
Anion gap: 9 (ref 5–15)
BUN: 13 mg/dL (ref 6–23)
CO2: 28 mmol/L (ref 19–32)
Calcium: 9.1 mg/dL (ref 8.4–10.5)
Chloride: 105 mEq/L (ref 96–112)
Creatinine, Ser: 1.34 mg/dL (ref 0.50–1.35)
GFR calc Af Amer: 77 mL/min — ABNORMAL LOW (ref >90)
GFR calc non Af Amer: 66 mL/min — ABNORMAL LOW (ref >90)
Glucose, Bld: 115 mg/dL — ABNORMAL HIGH (ref 70–99)
Potassium: 3.2 mmol/L — ABNORMAL LOW (ref 3.5–5.1)
Sodium: 142 mmol/L (ref 135–145)

## 2014-04-09 LAB — RAPID URINE DRUG SCREEN, HOSP PERFORMED
Amphetamines: NOT DETECTED
Barbiturates: NOT DETECTED
Benzodiazepines: NOT DETECTED
Cocaine: POSITIVE — AB
Opiates: NOT DETECTED
Tetrahydrocannabinol: NOT DETECTED

## 2014-04-09 LAB — CBC WITH DIFFERENTIAL/PLATELET
Basophils Absolute: 0 10*3/uL (ref 0.0–0.1)
Basophils Relative: 0 % (ref 0–1)
Eosinophils Absolute: 0.1 10*3/uL (ref 0.0–0.7)
Eosinophils Relative: 1 % (ref 0–5)
HCT: 43.8 % (ref 39.0–52.0)
Hemoglobin: 14.6 g/dL (ref 13.0–17.0)
Lymphocytes Relative: 22 % (ref 12–46)
Lymphs Abs: 2.2 10*3/uL (ref 0.7–4.0)
MCH: 29.4 pg (ref 26.0–34.0)
MCHC: 33.3 g/dL (ref 30.0–36.0)
MCV: 88.1 fL (ref 78.0–100.0)
Monocytes Absolute: 0.7 10*3/uL (ref 0.1–1.0)
Monocytes Relative: 8 % (ref 3–12)
Neutro Abs: 6.9 10*3/uL (ref 1.7–7.7)
Neutrophils Relative %: 69 % (ref 43–77)
Platelets: 148 10*3/uL — ABNORMAL LOW (ref 150–400)
RBC: 4.97 MIL/uL (ref 4.22–5.81)
RDW: 14.1 % (ref 11.5–15.5)
WBC: 9.9 10*3/uL (ref 4.0–10.5)

## 2014-04-09 MED ORDER — XARELTO VTE STARTER PACK 15 & 20 MG PO TBPK: 15.0000 mg | ORAL_TABLET | ORAL | Status: DC

## 2014-04-09 NOTE — ED Notes (Signed)
Pt refused to sign out.

## 2014-04-09 NOTE — ED Notes (Signed)
Pt here to detox from crack cocaine, denies SI/HI, hallucinations.

## 2014-04-09 NOTE — ED Notes (Signed)
US in room 

## 2014-04-09 NOTE — Progress Notes (Signed)
  CARE MANAGEMENT ED NOTE 04/09/2014  Patient:  [name],[name]   Account Number:  [id number]  Date Initiated:  04/09/2014  Documentation initiated by:  Radford PaxFERRERO,Derrall Hicks  Subjective/Objective Assessment:   Patient presents to ED for detox from crack coccaine and dvt     Subjective/Objective Assessment Detail:     Action/Plan:   Action/Plan Detail:   Anticipated DC Date:  04/09/2014     Status Recommendation to Physician:   Result of Recommendation:    Other ED Services  Consult Working Plan    DC Planning Services  CM consult  Other  PCP issues    Choice offered to / List presented to:            Status of service:  Completed, signed off  ED Comments:   ED Comments Detail:  [facility] by [facility] to speak to patient regarding Xarelto.  EDCM spoke topatient at bedside.  Patient reports he has not taken his coumadin in a year and a half for history of dvt.  Patient reports he is working, he did not elaborate on this.  Patient did not elaborate as to why he has not taken his coumadin in a year.  Discussed Xarelto with patient.  Patient reports, "Do I have to get my labs drawn and watch what I eat, oh and the lovenox needles the bruising!"  EDCM [facility] who will come to educate patient on Xarelto and deliver discharge packet.  EDCM provided patient with 30 day free trial preactivated Xarelto card.  Patient reports he has not seen his pcp Dr. Delane GingerGill in "maybe over a year or two. Her name doesn't ring a bell with me."  After speaking to pharmacist, patient agreeable to Xarelto.  EDCM instructed patient to take prescription of Xarelto with 30 day free trial card to pharmacy to have it filled for free.  [facility]  [facility] with [facility] number to make appointment and to make an appointment with financial counselor.  EDCM received patient's permission to [facility] attempts to make an [facility].  EDCM  STRONGLY advised patient to follow up with a doctor and not to wait until 30 days are up before seeking care.  Patient verbalized understanding. No further EDCM needs at this time.

## 2014-04-09 NOTE — ED Provider Notes (Signed)
CSN: [id number]     Arrival date & time 04/09/14  1539 History   First MD Initiated Contact with Patient 04/09/14 1557     Chief Complaint  Patient presents with  . Cocaine Detox    . Leg Pain    calves     (Consider location/radiation/quality/duration/timing/severity/associated sxs/prior Treatment) HPI Patient presents to the emergency department requesting detox from cocaine and crack, and also complaining of right lower extremity pain.  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  Patient states that he has not had any chest pain, nausea, vomiting, abdominal pain, hallucinations, weakness, dizziness, headache, blurred vision, anorexia, polyurea polydipsia, hematemesis, bloody stool, numbness, lightheadedness, near syncope or syncope.  The patient states that he has not followed up with his primary care doctor.  Patient, states he has been on a 3 day binge of crack cocaine use Past Medical History  Diagnosis Date  . PE (pulmonary embolism) 2011; 07/2011  . Syncope and collapse 01/16/2012    "woke up to dog licking my face" (01/16/2012)  . 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 Cocaine abuse   . Family history of early CAD     Father deceased at 37 yo with MI  . Tobacco abuse    Past Surgical History  Procedure Laterality Date  . No past surgeries     Family History  Problem Relation Age of Onset  . Coronary artery disease Father 7536    MI at age 936  . Colon cancer Paternal Grandmother   . Cancer Maternal Grandfather    History  Substance Use Topics  . Smoking status: Current Every Day Smoker -- 0.50 packs/day for 5 years    Types: Cigarettes  . Smokeless tobacco: Never Used  . Alcohol Use: Yes     Comment: occasion    Review of Systems  All other systems negative except as documented in the HPI. All pertinent positives and negatives as reviewed in the HPI.  Allergies  Review of patient's allergies indicates  no known allergies.  Home Medications   Prior to Admission medications   Medication Sig Start Date End Date Taking? Authorizing Provider  WARFARIN SODIUM PO Take by mouth.   Yes Historical Provider, MD   BP 150/82 mmHg  Pulse 76  Temp(Src) 98 F (36.7 C) (Oral)  Resp 16  SpO2 96% Physical Exam  Constitutional: He is oriented to person, place, and time. He appears well-developed and well-nourished.  HENT:  Head: Normocephalic and atraumatic.  Mouth/Throat: Oropharynx is clear and moist.  Eyes: Pupils are equal, round, and reactive to light.  Neck: Normal range of motion. Neck supple.  Cardiovascular: Normal rate, regular rhythm and normal heart sounds.  Exam reveals no gallop and no friction rub.   No murmur heard. Pulmonary/Chest: Effort normal and breath sounds normal. No respiratory distress.  Musculoskeletal:       Right lower leg: He exhibits tenderness. He exhibits no bony tenderness, no swelling, no edema, no deformity and no laceration.  Neurological: He is alert and oriented to person, place, and time. He exhibits normal muscle tone. Coordination normal.  Skin: Skin is warm and dry. No rash noted. No erythema.  Nursing note and vitals reviewed.   ED Course  Procedures (including critical care time) Labs Review Labs Reviewed  BASIC METABOLIC PANEL - Abnormal; Notable for the following:    Potassium 3.2 (*)    Glucose,  Bld 115 (*)    GFR calc non Af Amer 66 (*)    GFR calc Af Amer 77 (*)    All other components within normal limits  CBC WITH DIFFERENTIAL - Abnormal; Notable for the following:    Platelets 148 (*)    All other components within normal limits  URINE RAPID DRUG SCREEN (HOSP PERFORMED) - Abnormal; Notable for the following:    Cocaine POSITIVE (*)    All other components within normal limits    Patient is given outpatient referral for substance abuse.  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  Patient  is given follow-up at the [facility]   MDM   Final diagnoses:  None        Carlyle Dolly, PA-C 04/10/14 0144  Gwyneth Sprout, MD 04/10/14 2259

## 2014-04-09 NOTE — Discharge Instructions (Addendum)
Information on my medicine - XARELTO (rivaroxaban)  This medication education was reviewed with me or my healthcare representative as part of my discharge preparation.  The pharmacist that spoke with me during my hospital stay was:  Rollene FareWilliamson, Erin R, [facility]  WHY WAS XARELTO PRESCRIBED FOR YOU? Xarelto was prescribed to treat blood clots that may have been found in the veins of your legs (deep vein thrombosis) or in your lungs (pulmonary embolism) and to reduce the risk of them occurring again.  What do you need to know about Xarelto? The starting dose is one 15 mg tablet taken TWICE daily with food for the FIRST 21 DAYS then on (enter date)  February 11th  the dose is changed to one 20 mg tablet taken ONCE A DAY with your evening meal.  DO NOT stop taking Xarelto without talking to the health care provider who prescribed the medication.  Refill your prescription for 20 mg tablets before you run out.  After discharge, you should have regular check-up appointments with your healthcare provider that is prescribing your Xarelto.  In the future your dose may need to be changed if your kidney function changes by a significant amount.  What do you do if you miss a dose? If you are taking Xarelto TWICE DAILY and you miss a dose, take it as soon as you remember. You may take two 15 mg tablets (total 30 mg) at the same time then resume your regularly scheduled 15 mg twice daily the next day.  If you are taking Xarelto ONCE DAILY and you miss a dose, take it as soon as you remember on the same day then continue your regularly scheduled once daily regimen the next day. Do not take two doses of Xarelto at the same time.   Important Safety Information Xarelto is a blood thinner medicine that can cause bleeding. You should call your healthcare provider right away if you experience any of the following: ? Bleeding from an injury or your nose that does not stop. ? Unusual colored urine (red or dark  brown) or unusual colored stools (red or black). ? Unusual bruising for unknown reasons. ? A serious fall or if you hit your head (even if there is no bleeding).  Some medicines may interact with Xarelto and might increase your risk of bleeding while on Xarelto. To help avoid this, consult your healthcare provider or pharmacist prior to using any new prescription or non-prescription medications, including herbals, vitamins, non-steroidal anti-inflammatory drugs (NSAIDs) and supplements.  This website has more information on Xarelto: [email or web address].xarelto.com.   Follow-up with the clinic provided.  Return here as needed.  Follow-up with the resources for substance abuse

## 2014-04-09 NOTE — ED Notes (Signed)
Report received from previous RN, Rock NephewLatroshia.

## 2014-04-09 NOTE — Progress Notes (Signed)
Bilateral lower extremity venous duplex completed.  Right:  DVT noted in the popliteal vein.  No evidence of superficial thrombosis.  No Baker's cyst.  Left:  No evidence of DVT, superficial thrombosis, or Baker's cyst.   

## 2014-04-10 NOTE — Progress Notes (Signed)
04/10/2014 A. Randeep Biondolillo RNCM 1623pm EDCM called and left voice message for patient to inform him that [facility] ahs [facility] ant 3pm with Dr. Armen PickupFunches.  [facility] to make [facility] appointment as he will need further assistance with Xarelto.  No further EDCM needs at this time

## 2014-04-11 ENCOUNTER — Emergency Department: Payer: Self-pay | Admitting: Emergency Medicine

## 2014-04-11 LAB — URINALYSIS, COMPLETE
BACTERIA: NONE SEEN
BILIRUBIN, UR: NEGATIVE
Blood: NEGATIVE
GLUCOSE, UR: NEGATIVE mg/dL (ref 0–75)
Ketone: NEGATIVE
LEUKOCYTE ESTERASE: NEGATIVE
Nitrite: NEGATIVE
PH: 6 (ref 4.5–8.0)
Protein: NEGATIVE
RBC,UR: <1 /HPF (ref 0–5)
SQUAMOUS EPITHELIAL: NONE SEEN
Specific Gravity: 1.014 (ref 1.003–1.030)
WBC UR: <1 /HPF (ref 0–5)

## 2014-04-11 LAB — DRUG SCREEN, URINE
AMPHETAMINES, UR SCREEN: NEGATIVE (ref ?–1000)
Barbiturates, Ur Screen: NEGATIVE (ref ?–200)
Benzodiazepine, Ur Scrn: NEGATIVE (ref ?–200)
Cannabinoid 50 Ng, Ur ~~LOC~~: NEGATIVE (ref ?–50)
Cocaine Metabolite,Ur ~~LOC~~: POSITIVE (ref ?–300)
MDMA (ECSTASY) UR SCREEN: NEGATIVE (ref ?–500)
Methadone, Ur Screen: NEGATIVE (ref ?–300)
Opiate, Ur Screen: NEGATIVE (ref ?–300)
Phencyclidine (PCP) Ur S: NEGATIVE (ref ?–25)
TRICYCLIC, UR SCREEN: NEGATIVE (ref ?–1000)

## 2014-04-11 LAB — COMPREHENSIVE METABOLIC PANEL
ALBUMIN: 3.4 g/dL (ref 3.4–5.0)
ANION GAP: 3 — AB (ref 7–16)
Alkaline Phosphatase: 74 U/L
BILIRUBIN TOTAL: 0.4 mg/dL (ref 0.2–1.0)
BUN: 11 mg/dL (ref 7–18)
CALCIUM: 8.5 mg/dL (ref 8.5–10.1)
CO2: 29 mmol/L (ref 21–32)
CREATININE: 1.48 mg/dL — AB (ref 0.60–1.30)
Chloride: 107 mmol/L (ref 98–107)
EGFR (African American): >60
EGFR (Non-African Amer.): 57 — ABNORMAL LOW
GLUCOSE: 100 mg/dL — AB (ref 65–99)
OSMOLALITY: 283 (ref 275–301)
POTASSIUM: 4 mmol/L (ref 3.5–5.1)
SGOT(AST): 39 U/L — ABNORMAL HIGH (ref 15–37)
SGPT (ALT): 54 U/L
SODIUM: 142 mmol/L (ref 136–145)
Total Protein: 6.8 g/dL (ref 6.4–8.2)

## 2014-04-11 LAB — CBC
HCT: 32.8 % — ABNORMAL LOW (ref 40.0–52.0)
HGB: 10.6 g/dL — AB (ref 13.0–18.0)
MCH: 29.2 pg (ref 26.0–34.0)
MCHC: 32.3 g/dL (ref 32.0–36.0)
MCV: 91 fL (ref 80–100)
Platelet: 105 10*3/uL — ABNORMAL LOW (ref 150–440)
RBC: 3.63 10*6/uL — AB (ref 4.40–5.90)
RDW: 14 % (ref 11.5–14.5)
WBC: 5.7 10*3/uL (ref 3.8–10.6)

## 2014-04-11 LAB — ACETAMINOPHEN LEVEL: Acetaminophen: <2 ug/mL

## 2014-04-11 LAB — ETHANOL

## 2014-04-11 LAB — TSH: THYROID STIMULATING HORM: 1.5 u[IU]/mL

## 2014-04-11 LAB — SALICYLATE LEVEL

## 2014-04-18 ENCOUNTER — Ambulatory Visit: Payer: Self-pay | Admitting: Family Medicine

## 2014-07-22 ENCOUNTER — Encounter (HOSPITAL_COMMUNITY): Payer: Self-pay | Admitting: Emergency Medicine

## 2014-07-22 DIAGNOSIS — R42 Dizziness and giddiness: Secondary | ICD-10-CM | POA: Insufficient documentation

## 2014-07-22 DIAGNOSIS — Z86711 Personal history of pulmonary embolism: Secondary | ICD-10-CM | POA: Insufficient documentation

## 2014-07-22 DIAGNOSIS — Z720 Tobacco use: Secondary | ICD-10-CM | POA: Insufficient documentation

## 2014-07-22 DIAGNOSIS — B349 Viral infection, unspecified: Secondary | ICD-10-CM | POA: Insufficient documentation

## 2014-07-22 DIAGNOSIS — Z86718 Personal history of other venous thrombosis and embolism: Secondary | ICD-10-CM | POA: Insufficient documentation

## 2014-07-22 LAB — CBC WITH DIFFERENTIAL/PLATELET
BASOS ABS: 0 10*3/uL (ref 0.0–0.1)
BASOS PCT: 0 % (ref 0–1)
EOS ABS: 0 10*3/uL (ref 0.0–0.7)
Eosinophils Relative: 0 % (ref 0–5)
HCT: 47.7 % (ref 39.0–52.0)
Hemoglobin: 16.1 g/dL (ref 13.0–17.0)
Lymphocytes Relative: 8 % — ABNORMAL LOW (ref 12–46)
Lymphs Abs: 0.7 10*3/uL (ref 0.7–4.0)
MCH: 29.3 pg (ref 26.0–34.0)
MCHC: 33.8 g/dL (ref 30.0–36.0)
MCV: 86.7 fL (ref 78.0–100.0)
Monocytes Absolute: 0.5 10*3/uL (ref 0.1–1.0)
Monocytes Relative: 6 % (ref 3–12)
NEUTROS PCT: 86 % — AB (ref 43–77)
Neutro Abs: 6.8 10*3/uL (ref 1.7–7.7)
PLATELETS: 155 10*3/uL (ref 150–400)
RBC: 5.5 MIL/uL (ref 4.22–5.81)
RDW: 13.7 % (ref 11.5–15.5)
WBC: 8 10*3/uL (ref 4.0–10.5)

## 2014-07-22 NOTE — ED Notes (Signed)
Pt. reports fatigue with dizziness and mild headache onset this evening .

## 2014-07-23 ENCOUNTER — Emergency Department (HOSPITAL_COMMUNITY)
Admission: EM | Admit: 2014-07-23 | Discharge: 2014-07-23 | Disposition: A | Discharge status: Home / Self Care | Payer: Self-pay | Attending: Emergency Medicine | Admitting: Emergency Medicine

## 2014-07-23 ENCOUNTER — Encounter (HOSPITAL_COMMUNITY): Payer: Self-pay | Admitting: Radiology

## 2014-07-23 ENCOUNTER — Emergency Department (HOSPITAL_COMMUNITY): Payer: Self-pay

## 2014-07-23 DIAGNOSIS — B349 Viral infection, unspecified: Secondary | ICD-10-CM

## 2014-07-23 LAB — COMPREHENSIVE METABOLIC PANEL
ALBUMIN: 4 g/dL (ref 3.5–5.0)
ALK PHOS: 59 U/L (ref 38–126)
ALT: 35 U/L (ref 17–63)
AST: 27 U/L (ref 15–41)
Anion gap: 9 (ref 5–15)
BUN: 17 mg/dL (ref 6–20)
CO2: 27 mmol/L (ref 22–32)
Calcium: 9 mg/dL (ref 8.9–10.3)
Chloride: 103 mmol/L (ref 101–111)
Creatinine, Ser: 1.63 mg/dL — ABNORMAL HIGH (ref 0.61–1.24)
GFR calc non Af Amer: 52 mL/min — ABNORMAL LOW (ref >60)
GLUCOSE: 107 mg/dL — AB (ref 70–99)
POTASSIUM: 3.7 mmol/L (ref 3.5–5.1)
Sodium: 139 mmol/L (ref 135–145)
TOTAL PROTEIN: 6.7 g/dL (ref 6.5–8.1)
Total Bilirubin: 0.6 mg/dL (ref 0.3–1.2)

## 2014-07-23 LAB — RAPID URINE DRUG SCREEN, HOSP PERFORMED
Amphetamines: NOT DETECTED
BARBITURATES: NOT DETECTED
Benzodiazepines: NOT DETECTED
COCAINE: NOT DETECTED
Opiates: NOT DETECTED
TETRAHYDROCANNABINOL: NOT DETECTED

## 2014-07-23 LAB — URINALYSIS, ROUTINE W REFLEX MICROSCOPIC
Bilirubin Urine: NEGATIVE
GLUCOSE, UA: NEGATIVE mg/dL
HGB URINE DIPSTICK: NEGATIVE
KETONES UR: NEGATIVE mg/dL
Leukocytes, UA: NEGATIVE
Nitrite: NEGATIVE
PH: 5.5 (ref 5.0–8.0)
Protein, ur: NEGATIVE mg/dL
SPECIFIC GRAVITY, URINE: 1.018 (ref 1.005–1.030)
Urobilinogen, UA: 1 mg/dL (ref 0.0–1.0)

## 2014-07-23 LAB — D-DIMER, QUANTITATIVE: D-Dimer, Quant: 0.59 ug/mL-FEU — ABNORMAL HIGH (ref 0.00–0.48)

## 2014-07-23 MED ORDER — IOHEXOL 350 MG/ML SOLN
100.0000 mL | Freq: Once | INTRAVENOUS | Status: AC | PRN
  Administered 2014-07-23: 100 mL via INTRAVENOUS

## 2014-07-23 MED ORDER — IBUPROFEN 800 MG PO TABS: 800.0000 mg | ORAL_TABLET | Freq: Three times a day (TID) | ORAL | Status: DC

## 2014-07-23 MED ORDER — IBUPROFEN 800 MG PO TABS
800.0000 mg | ORAL_TABLET | Freq: Once | ORAL | Status: AC
  Administered 2014-07-23: 800 mg via ORAL
  Filled 2014-07-23: qty 1

## 2014-07-23 NOTE — ED Provider Notes (Signed)
CSN: [id number]     Arrival date & time 07/22/14  2257 History  This chart was scribed for Dorethy Tomey, MD by Annye AsaAnna Dorsett, ED Scribe. This patient was seen in room B14C/B14C and the patient's care was started at 1:14 AM.    Chief Complaint  Patient presents with  . Fatigue  . Dizziness   Patient is a 39 y.o. male presenting with dizziness. The history is provided by the patient. No language interpreter was used.  Dizziness Quality:  Lightheadedness Severity:  Moderate Onset quality:  Gradual Timing:  Constant Progression:  Improving Chronicity:  New Context: not when bending over   Context comment:  Standing Relieved by:  Lying down Worsened by:  Movement and sitting upright Ineffective treatments:  None tried Associated symptoms: headaches   Associated symptoms: no chest pain, no diarrhea, no nausea, no palpitations and no vomiting   Risk factors: no multiple medications and no new medications     HPI Comments: [name] is a 39 y.o. male who presents to the Emergency Department complaining of mild headache, mild SOB, lightheadedness and fatigue beginning this evening. 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 His lightheadedness increases with sitting up or standing. Patient states he took Alka-Selzer for headache without significant relief. He denies fever, abdominal pain, nausea, vomiting, diarrhea, cough, congestion, sore throat, chest pain, rash. He denies new exercise regimen, diet, medications, supplements, sick contacts.   No PCP at this time. He is not taking Xarelto or Coumadin at present.   Past Medical History  Diagnosis Date  . PE (pulmonary embolism) 2011; 07/2011  . Syncope and collapse 01/16/2012    "woke up to dog licking my face" (01/16/2012)  . 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 Cocaine abuse   . Family  history of early CAD     Father deceased at 37 yo with MI  . Tobacco abuse    Past Surgical History  Procedure Laterality Date  . No past surgeries     Family History  Problem Relation Age of Onset  . Coronary artery disease Father 7536    MI at age 37  . Colon cancer Paternal Grandmother   . Cancer Maternal Grandfather    History  Substance Use Topics  . Smoking status: Current Every Day Smoker -- 0.50 packs/day for 5 years    Types: Cigarettes  . Smokeless tobacco: Never Used  . Alcohol Use: Yes     Comment: occasion    Review of Systems  Constitutional: Positive for fatigue. Negative for fever.  HENT: Negative for congestion and sore throat.   Respiratory: Negative for cough and chest tightness.   Cardiovascular: Negative for chest pain, palpitations and leg swelling.  Gastrointestinal: Negative for nausea, vomiting, abdominal pain and diarrhea.  Skin: Negative for rash.  Neurological: Positive for dizziness, light-headedness and headaches. Negative for speech difficulty and numbness.  All other systems reviewed and are negative.   Allergies  Review of patient's allergies indicates no known allergies.  Home Medications   Prior to Admission medications   Medication Sig Start Date End Date Taking? Authorizing Provider  WARFARIN SODIUM PO Take by mouth.    Historical Provider, MD  XARELTO STARTER PACK 15 & 20 MG TBPK Take 15-20 mg by mouth as directed. Take as directed on package: Start with one 15mg  tablet  by mouth twice a day with food. On Day 22, switch to one 20mg  tablet once a day with food. 04/09/14   Christopher Lawyer, PA-C   BP 122/62 mmHg  Pulse 85  Temp(Src) 98.4 F (36.9 C)  Resp 20  Ht 6\' 5"  (1.956 m)  Wt 270 lb (122.471 kg)  BMI 32.01 kg/m2  SpO2 97% Physical Exam  Constitutional: He is oriented to person, place, and time. He appears well-developed and well-nourished. No distress.  HENT:  Head: Normocephalic and atraumatic.  Mouth/Throat:  Oropharynx is clear and moist. No oropharyngeal exudate.  Moist mucous membranes  Eyes: EOM are normal. Pupils are equal, round, and reactive to light.  Neck: Normal range of motion. Neck supple. No JVD present.  Cardiovascular: Normal rate, regular rhythm and normal heart sounds.  Exam reveals no gallop and no friction rub.   No murmur heard. No change in pulses with change in position  Pulmonary/Chest: Effort normal and breath sounds normal. No respiratory distress. He has no wheezes. He has no rales.  Abdominal: Soft. He exhibits no mass. Bowel sounds are increased. There is no tenderness. There is no rebound and no guarding.  Musculoskeletal: Normal range of motion. He exhibits no edema.  Moves all extremities normally.   Lymphadenopathy:    He has no cervical adenopathy.  Neurological: He is alert and oriented to person, place, and time. He displays normal reflexes. No cranial nerve deficit.  Cranial nerves 2-12 intact.   Skin: Skin is warm and dry. No rash noted. He is not diaphoretic.  Psychiatric: He has a normal mood and affect. His behavior is normal.  Nursing note and vitals reviewed.   ED Course  Procedures   DIAGNOSTIC STUDIES: Oxygen Saturation is 96% on RA, adequate by my interpretation.    COORDINATION OF CARE: 1:21 AM Discussed treatment plan with pt at bedside and pt agreed to plan.   Labs Review Labs Reviewed  CBC WITH DIFFERENTIAL/PLATELET - Abnormal; Notable for the following:    Neutrophils Relative % 86 (*)    Lymphocytes Relative 8 (*)    All other components within normal limits  COMPREHENSIVE METABOLIC PANEL - Abnormal; Notable for the following:    Glucose, Bld 107 (*)    Creatinine, Ser 1.63 (*)    GFR calc non Af Amer 52 (*)    All other components within normal limits    Imaging Review No results found.   EKG Interpretation None      MDM   Final diagnoses:  None  Patient did not tell scribe and EDP about vomiting and diarrhea even  though asked.  Told nurse about this.  Ruled out for PE and now patient admit to n/v/d. symptoms in this setting viral in nature.  Bland diet.  Lots of water.  And close follow up  I personally performed the services described in this documentation, which was scribed in my presence. The recorded information has been reviewed and is accurate.      Cy BlamerApril Aviance [place], MD 07/23/14 [phone]

## 2014-07-23 NOTE — ED Notes (Signed)
The pt has not been feeling well since he ate a sausage sandwich this am.  n v some watery stools.  He was feeling better fr awhile then started feeling weak with no energy. Alert no distress

## 2014-07-23 NOTE — ED Notes (Signed)
Pt back from c-t.  D/c papers but they say they have not talked to a doctor yet

## 2014-07-23 NOTE — ED Notes (Signed)
Pt given ginger ale per request

## 2014-07-23 NOTE — ED Notes (Signed)
To ct

## 2014-12-11 ENCOUNTER — Encounter (HOSPITAL_COMMUNITY): Payer: Self-pay

## 2014-12-11 ENCOUNTER — Emergency Department (HOSPITAL_COMMUNITY)
Admission: EM | Admit: 2014-12-11 | Discharge: 2014-12-12 | Disposition: A | Discharge status: Other Acute Inpatient Hospital | Payer: Self-pay | Attending: Emergency Medicine | Admitting: Emergency Medicine

## 2014-12-11 ENCOUNTER — Emergency Department (HOSPITAL_COMMUNITY): Payer: Self-pay

## 2014-12-11 DIAGNOSIS — Z86718 Personal history of other venous thrombosis and embolism: Secondary | ICD-10-CM | POA: Insufficient documentation

## 2014-12-11 DIAGNOSIS — F1494 Cocaine use, unspecified with cocaine-induced mood disorder: Secondary | ICD-10-CM | POA: Diagnosis present

## 2014-12-11 DIAGNOSIS — Z86711 Personal history of pulmonary embolism: Secondary | ICD-10-CM | POA: Insufficient documentation

## 2014-12-11 DIAGNOSIS — R079 Chest pain, unspecified: Secondary | ICD-10-CM | POA: Insufficient documentation

## 2014-12-11 DIAGNOSIS — R45851 Suicidal ideations: Secondary | ICD-10-CM

## 2014-12-11 DIAGNOSIS — R002 Palpitations: Secondary | ICD-10-CM | POA: Insufficient documentation

## 2014-12-11 DIAGNOSIS — M79604 Pain in right leg: Secondary | ICD-10-CM | POA: Insufficient documentation

## 2014-12-11 DIAGNOSIS — F1490 Cocaine use, unspecified, uncomplicated: Secondary | ICD-10-CM

## 2014-12-11 DIAGNOSIS — F1410 Cocaine abuse, uncomplicated: Secondary | ICD-10-CM | POA: Diagnosis present

## 2014-12-11 DIAGNOSIS — Z720 Tobacco use: Secondary | ICD-10-CM | POA: Insufficient documentation

## 2014-12-11 DIAGNOSIS — R0602 Shortness of breath: Secondary | ICD-10-CM | POA: Insufficient documentation

## 2014-12-11 DIAGNOSIS — F329 Major depressive disorder, single episode, unspecified: Secondary | ICD-10-CM | POA: Insufficient documentation

## 2014-12-11 LAB — RAPID URINE DRUG SCREEN, HOSP PERFORMED
Amphetamines: NOT DETECTED
BARBITURATES: NOT DETECTED
Benzodiazepines: NOT DETECTED
Cocaine: POSITIVE — AB
Opiates: NOT DETECTED
TETRAHYDROCANNABINOL: NOT DETECTED

## 2014-12-11 LAB — COMPREHENSIVE METABOLIC PANEL
ALBUMIN: 4.3 g/dL (ref 3.5–5.0)
ALK PHOS: 53 U/L (ref 38–126)
ALT: 30 U/L (ref 17–63)
ANION GAP: 6 (ref 5–15)
AST: 25 U/L (ref 15–41)
BILIRUBIN TOTAL: 0.7 mg/dL (ref 0.3–1.2)
BUN: 9 mg/dL (ref 6–20)
CALCIUM: 9.1 mg/dL (ref 8.9–10.3)
CO2: 28 mmol/L (ref 22–32)
CREATININE: 1.47 mg/dL — AB (ref 0.61–1.24)
Chloride: 106 mmol/L (ref 101–111)
GFR calc Af Amer: >60 mL/min (ref >60)
GFR calc non Af Amer: 59 mL/min — ABNORMAL LOW (ref >60)
Glucose, Bld: 101 mg/dL — ABNORMAL HIGH (ref 65–99)
Potassium: 4 mmol/L (ref 3.5–5.1)
SODIUM: 140 mmol/L (ref 135–145)
TOTAL PROTEIN: 7.4 g/dL (ref 6.5–8.1)

## 2014-12-11 LAB — CBC
HEMATOCRIT: 45.4 % (ref 39.0–52.0)
HEMOGLOBIN: 15.4 g/dL (ref 13.0–17.0)
MCH: 29.3 pg (ref 26.0–34.0)
MCHC: 33.9 g/dL (ref 30.0–36.0)
MCV: 86.3 fL (ref 78.0–100.0)
Platelets: 159 10*3/uL (ref 150–400)
RBC: 5.26 MIL/uL (ref 4.22–5.81)
RDW: 14.9 % (ref 11.5–15.5)
WBC: 8.9 10*3/uL (ref 4.0–10.5)

## 2014-12-11 LAB — ETHANOL: Alcohol, Ethyl (B): <5 mg/dL (ref <5)

## 2014-12-11 LAB — SALICYLATE LEVEL: Salicylate Lvl: <4 mg/dL (ref 2.8–30.0)

## 2014-12-11 LAB — I-STAT TROPONIN, ED: TROPONIN I, POC: 0 ng/mL (ref 0.00–0.08)

## 2014-12-11 LAB — ACETAMINOPHEN LEVEL

## 2014-12-11 MED ORDER — ACETAMINOPHEN 325 MG PO TABS: 650.0000 mg | ORAL_TABLET | ORAL | Status: DC | PRN

## 2014-12-11 MED ORDER — LORAZEPAM 1 MG PO TABS: 1.0000 mg | ORAL_TABLET | Freq: Three times a day (TID) | ORAL | Status: DC | PRN

## 2014-12-11 MED ORDER — ALUM & MAG HYDROXIDE-SIMETH 200-200-20 MG/5ML PO SUSP: 30.0000 mL | ORAL | Status: DC | PRN

## 2014-12-11 MED ORDER — NICOTINE 21 MG/24HR TD PT24
21.0000 mg | MEDICATED_PATCH | Freq: Every day | TRANSDERMAL | Status: DC
  Filled 2014-12-11: qty 1

## 2014-12-11 MED ORDER — IBUPROFEN 200 MG PO TABS: 600.0000 mg | ORAL_TABLET | Freq: Three times a day (TID) | ORAL | Status: DC | PRN

## 2014-12-11 MED ORDER — SODIUM CHLORIDE 0.9 % IV BOLUS (SEPSIS)
1000.0000 mL | Freq: Once | INTRAVENOUS | Status: AC
  Administered 2014-12-11: 1000 mL via INTRAVENOUS

## 2014-12-11 MED ORDER — ONDANSETRON HCL 4 MG PO TABS: 4.0000 mg | ORAL_TABLET | Freq: Three times a day (TID) | ORAL | Status: DC | PRN

## 2014-12-11 NOTE — ED Notes (Signed)
Patient states he has smoked crack on and off for "years."  Patient states he was in rehab in February of this year and was drug free for 3 months after being discharged from that facility.  Patient states he has been using crack daily for the last week or so and prior to that several times a week.  Patient missed several days of work due to his drug abuse and recently lost both of his jobs and his wife left him because of his crack use.  Patient states tonight he went to a friend's house with the intention of overdosing on crack to kill himself.  Patient states he has tried to harm himself in a similar fashion before.  Patient denies use of alcohol or other substances.  Patient states he had some chest pain earlier this evening after using crack.  Patient is somewhat tearful on exam.

## 2014-12-11 NOTE — ED Notes (Signed)
Patient requested and received a sandwich and soft drink.

## 2014-12-11 NOTE — ED Notes (Signed)
Pt is unable to give a urine sample at this time. Pt is aware that a sample is needed. 

## 2014-12-11 NOTE — ED Provider Notes (Signed)
CSN: [id number]     Arrival date & time 12/11/14  2020 History   First MD Initiated Contact with Patient 12/11/14 2041     Chief Complaint  Patient presents with  . Suicidal   [name] is a 39 y.o. male with a history of PE, DVT, cocaine abuse and tobacco abuse who presents to the emergency department complaining of suicidal ideations today. Patient reports he has been on a crack cocaine binge and has use 3-4 grams of cocaine since last night around 830. He reports he last used one hour PTA. Patient reports tonight he began having palpitations and became nervous and wanted to find help but could not find a phone. He reports he finally got help and was brought to the emergency department. Reports having palpitations earlier but this has since resolved. He complains of 4 out of 10 sharp substernal nonradiating chest pain currently as well as slight shortness of breath. He endorses suicidal ideations with a plan to overdose on crack cocaine as well as homicidal ideations towards "just other people." The patient reports a history of right leg DVT and is supposed to be taking Xarelto. He reports he has not taken Xarelto over 8 months. Patient denies history of psychiatric admission. The patient denies history of suicide attempts. The patient denies fevers, chills, cough, wheezing, auditory hallucinations, visual hallucinations, LOC, abdominal pain, nausea, vomiting or diarrhea.  (Consider location/radiation/quality/duration/timing/severity/associated sxs/prior Treatment) HPI  Past Medical History  Diagnosis Date  . PE (pulmonary embolism) 2011; 07/2011  . Syncope and collapse 01/16/2012    "woke up to dog licking my face" (01/16/2012)  . 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 Cocaine abuse   . Family history of early CAD     Father deceased at 48 yo with MI  . Tobacco abuse    Past Surgical History  Procedure Laterality Date  . No past surgeries     Family History   Problem Relation Age of Onset  . Coronary artery disease Father 26    MI at age 28  . Colon cancer Paternal Grandmother   . Cancer Maternal Grandfather    Social History  Substance Use Topics  . Smoking status: Current Every Day Smoker -- 0.50 packs/day for 5 years    Types: Cigarettes  . Smokeless tobacco: Never Used  . Alcohol Use: Yes     Comment: occasion    Review of Systems  Constitutional: Negative for fever and chills.  HENT: Negative for congestion and sore throat.   Eyes: Negative for visual disturbance.  Respiratory: Positive for shortness of breath. Negative for cough and wheezing.   Cardiovascular: Positive for chest pain and palpitations.  Gastrointestinal: Negative for nausea, vomiting, abdominal pain and diarrhea.  Genitourinary: Negative for dysuria.  Musculoskeletal: Negative for back pain and neck pain.  Skin: Negative for rash.  Neurological: Negative for syncope, weakness, light-headedness, numbness and headaches.  Psychiatric/Behavioral: Positive for suicidal ideas and dysphoric mood.      Allergies  Review of patient's allergies indicates no known allergies.  Home Medications   Prior to Admission medications   Medication Sig Start Date End Date Taking? Authorizing Provider  ibuprofen (ADVIL,MOTRIN) 800 MG tablet Take 1 tablet (800 mg total) by mouth 3 (three) times daily. Patient not taking: Reported on 12/11/2014 07/23/14   April Palumbo, MD  XARELTO STARTER PACK 15 & 20 MG TBPK Take 15-20 mg by mouth as directed. Take as directed on package: Start  with one  tablet by mouth twice a day with food. On Day 22, switch to one  tablet once a day with food. Patient not taking: Reported on 07/23/2014 04/09/14   Charlestine Night, PA-C   BP 146/95 mmHg  Pulse 79  Temp(Src) 98.7 F (37.1 C) (Oral)  Resp 20  SpO2 98% Physical Exam  Constitutional: He is oriented to person, place, and time. He appears well-developed and well-nourished. No distress.   Nontoxic appearing.  HENT:  Head: Normocephalic and atraumatic.  Mouth/Throat: Oropharynx is clear and moist.  Eyes: Conjunctivae are normal. Pupils are equal, round, and reactive to light. Right eye exhibits no discharge. Left eye exhibits no discharge.  Neck: Normal range of motion. Neck supple. No JVD present. No tracheal deviation present.  Cardiovascular: Normal rate, regular rhythm, normal heart sounds and intact distal pulses.  Exam reveals no gallop and no friction rub.   No murmur heard. Bilateral radial, posterior tibialis and dorsalis pedis pulses are intact.    Pulmonary/Chest: Effort normal and breath sounds normal. No respiratory distress. He has no wheezes. He has no rales. He exhibits no tenderness.  Lungs are clear to auscultation bilaterally. No chest wall tenderness.  Abdominal: Soft. Bowel sounds are normal. There is no tenderness. There is no guarding.  Musculoskeletal: Normal range of motion. He exhibits edema. He exhibits no tenderness.  Possibly mild edema to his right lower leg without calf tenderness.  Patient is spontaneously moving all extremities in a coordinated fashion exhibiting good strength.   Lymphadenopathy:    He has no cervical adenopathy.  Neurological: He is alert and oriented to person, place, and time. Coordination normal.  Skin: Skin is warm and dry. No rash noted. He is not diaphoretic. No erythema. No pallor.  Psychiatric: His speech is normal and behavior is normal. He is not actively hallucinating. Thought content is not paranoid. He exhibits a depressed mood. He expresses homicidal and suicidal ideation. He expresses suicidal plans. He expresses no homicidal plans.  The patient appears depressed. Endorses suicidal ideations with a plan to overdose on crack cocaine. Endorses homicidal ideations that are non-specific.   Nursing note and vitals reviewed.   ED Course  Procedures (including critical care time) Labs Review Labs Reviewed   COMPREHENSIVE METABOLIC PANEL - Abnormal; Notable for the following:    Glucose, Bld 101 (*)    Creatinine, Ser 1.47 (*)    GFR calc non Af Amer 59 (*)    All other components within normal limits  URINE RAPID DRUG SCREEN, HOSP PERFORMED - Abnormal; Notable for the following:    Cocaine POSITIVE (*)    All other components within normal limits  ACETAMINOPHEN LEVEL - Abnormal; Notable for the following:    Acetaminophen (Tylenol), Serum <10 (*)    All other components within normal limits  CBC  ETHANOL  SALICYLATE LEVEL  I-STAT TROPOININ, ED    Imaging Review Dg Chest 2 View  12/11/2014   CLINICAL DATA:  Cocaine overdose.  EXAM: CHEST  2 VIEW  COMPARISON:  Chest CT 07/23/2014  FINDINGS: The cardiac silhouette, mediastinal and hilar contours are within normal limits and stable. The lungs are clear. No pleural effusion. The bony thorax is intact.  IMPRESSION: No acute cardiopulmonary findings.   Electronically Signed   By: Rudie Meyer M.D.   On: 12/11/2014 21:24   I have personally reviewed and evaluated these images and lab results as part of my medical decision-making.   EKG Interpretation   Date/Time:  Thursday December 11 2014 20:32:44 EDT Ventricular Rate:  87 PR Interval:  179 QRS Duration: 67 QT Interval:  342 QTC Calculation: 411 R Axis:   46 Text Interpretation:  Sinus rhythm ST elev, probable normal early repol  pattern No significant change since last tracing Confirmed by FLOYD MD,  DANIEL [phone]) on 12/11/2014 9:03:06 PM      Filed Vitals:   12/11/14 2252 12/11/14 2346 12/12/14 0000 12/12/14 0047  BP: 120/81 138/83 134/84 146/95  Pulse: 91 80 92 79  Temp:      TempSrc:      Resp: SpO2: 99% 98% 95% 98%     MDM   Final diagnoses:  Suicidal ideations  Crack cocaine use  Right leg pain   This is a 39 y.o. male with a history of PE, DVT, cocaine abuse and tobacco abuse who presents to the emergency department complaining of suicidal ideations  today. Patient reports he has been on a crack cocaine binge and has use 3-4 grams of cocaine since last night around 830. He reports he last used one hour PTA.  He complains of 4 out of 10 sharp substernal nonradiating chest pain currently as well as slight shortness of breath. He endorses suicidal ideations with a plan to overdose on crack cocaine as well as homicidal ideations towards "just other people." The patient reports a history of right leg DVT and is supposed to be taking Xarelto. He reports he has not taken Xarelto over 8 months.  On exam the patient is afebrile nontoxic appearing. The patient is not tachypneic, tachycardic or hypoxic. His lungs are clear to auscultation bilaterally. He appears have some mild right lower extremity edema without calf tenderness. Patient endorses suicidal ideations the plan to overdose on crack cocaine. He also endorses homicidal ideations towards "just other people." The patient appears depressed.  EKG reveals a sinus rhythm with no significant change from his last tracing. Urine drug screen is positive for cocaine. Troponin is negative. CBC is within normal limits. CMP reveals a mildly elevated creatinine 1.47 which is around the patient's baseline and is an improvement from his last visit. Patient received fluid bolus in the emergency department. Alcohol, acetaminophen and salicylate levels are negative. Chest x-ray is unremarkable.  The patient is awaiting Doppler ultrasound of his right lower extremity. This is unable to be obtained tonight but there is an order placed for this to take place tomorrow morning.  After ultrasound of his right lower extremity to rule out DVT the patient can be medically cleared for behavioral health admission. The patient meets inpatient criteria for psychiatric admission. Temporary psychiatric ordering orders place. The patient is voluntary.   This patient was discussed with Dr. Adela Lank who agrees with assessment and  plan.    Everlene Farrier, PA-C 12/12/14 0122  Melene Plan, DO 12/12/14 1538

## 2014-12-11 NOTE — ED Notes (Signed)
Pt was trying to commit suicide tonight on cocaine at a friends house, they didn't have a phone so he started walking and almost passed out in the street, patient is very tearful in triage, he states that he has lost his wife and grandkids and doesn't want to live anymore.

## 2014-12-11 NOTE — ED Notes (Signed)
Called staffing for a sitter 

## 2014-12-12 ENCOUNTER — Inpatient Hospital Stay (HOSPITAL_COMMUNITY)
Admission: EM | Admit: 2014-12-12 | Discharge: 2014-12-18 | DRG: 897 | Disposition: A | Discharge status: Home / Self Care | Payer: Federal, State, Local not specified - Other | Source: Intra-hospital | Attending: Psychiatry | Admitting: Psychiatry

## 2014-12-12 ENCOUNTER — Emergency Department (EMERGENCY_DEPARTMENT_HOSPITAL): Payer: Self-pay

## 2014-12-12 ENCOUNTER — Encounter (HOSPITAL_COMMUNITY): Payer: Self-pay

## 2014-12-12 DIAGNOSIS — T1491 Suicide attempt: Secondary | ICD-10-CM | POA: Diagnosis not present

## 2014-12-12 DIAGNOSIS — F1410 Cocaine abuse, uncomplicated: Secondary | ICD-10-CM | POA: Diagnosis present

## 2014-12-12 DIAGNOSIS — Z86718 Personal history of other venous thrombosis and embolism: Secondary | ICD-10-CM | POA: Diagnosis not present

## 2014-12-12 DIAGNOSIS — R45851 Suicidal ideations: Secondary | ICD-10-CM | POA: Diagnosis not present

## 2014-12-12 DIAGNOSIS — F1414 Cocaine abuse with cocaine-induced mood disorder: Secondary | ICD-10-CM | POA: Diagnosis present

## 2014-12-12 DIAGNOSIS — Z86711 Personal history of pulmonary embolism: Secondary | ICD-10-CM

## 2014-12-12 DIAGNOSIS — T405X2A Poisoning by cocaine, intentional self-harm, initial encounter: Secondary | ICD-10-CM | POA: Diagnosis not present

## 2014-12-12 DIAGNOSIS — F1494 Cocaine use, unspecified with cocaine-induced mood disorder: Secondary | ICD-10-CM | POA: Diagnosis present

## 2014-12-12 DIAGNOSIS — F17210 Nicotine dependence, cigarettes, uncomplicated: Secondary | ICD-10-CM | POA: Diagnosis present

## 2014-12-12 DIAGNOSIS — M7989 Other specified soft tissue disorders: Secondary | ICD-10-CM

## 2014-12-12 DIAGNOSIS — F329 Major depressive disorder, single episode, unspecified: Secondary | ICD-10-CM | POA: Diagnosis present

## 2014-12-12 DIAGNOSIS — Z915 Personal history of self-harm: Secondary | ICD-10-CM

## 2014-12-12 DIAGNOSIS — G4700 Insomnia, unspecified: Secondary | ICD-10-CM | POA: Diagnosis present

## 2014-12-12 MED ORDER — ALUM & MAG HYDROXIDE-SIMETH 200-200-20 MG/5ML PO SUSP: 30.0000 mL | ORAL | Status: DC | PRN

## 2014-12-12 MED ORDER — NICOTINE 21 MG/24HR TD PT24
21.0000 mg | MEDICATED_PATCH | Freq: Every day | TRANSDERMAL | Status: DC
  Administered 2014-12-12 – 2014-12-17 (×6): 21 mg via TRANSDERMAL
  Filled 2014-12-12 (×11): qty 1

## 2014-12-12 MED ORDER — LISINOPRIL 10 MG PO TABS
10.0000 mg | ORAL_TABLET | Freq: Every day | ORAL | Status: DC
  Administered 2014-12-13 – 2014-12-18 (×6): 10 mg via ORAL
  Filled 2014-12-12 (×2): qty 1
  Filled 2014-12-12: qty 7
  Filled 2014-12-12 (×5): qty 1

## 2014-12-12 MED ORDER — MAGNESIUM HYDROXIDE 400 MG/5ML PO SUSP: 30.0000 mL | Freq: Every day | ORAL | Status: DC | PRN

## 2014-12-12 MED ORDER — ACETAMINOPHEN 325 MG PO TABS: 650.0000 mg | ORAL_TABLET | ORAL | Status: DC | PRN

## 2014-12-12 MED ORDER — TRAZODONE HCL 100 MG PO TABS
ORAL_TABLET | ORAL | Status: AC
  Filled 2014-12-12: qty 1

## 2014-12-12 MED ORDER — ONDANSETRON HCL 4 MG PO TABS: 4.0000 mg | ORAL_TABLET | Freq: Three times a day (TID) | ORAL | Status: DC | PRN

## 2014-12-12 MED ORDER — LISINOPRIL 20 MG PO TABS
20.0000 mg | ORAL_TABLET | Freq: Once | ORAL | Status: AC
  Administered 2014-12-12: 20 mg via ORAL
  Filled 2014-12-12: qty 1

## 2014-12-12 MED ORDER — IBUPROFEN 600 MG PO TABS: 600.0000 mg | ORAL_TABLET | Freq: Three times a day (TID) | ORAL | Status: DC | PRN

## 2014-12-12 MED ORDER — TRAZODONE HCL 100 MG PO TABS
100.0000 mg | ORAL_TABLET | Freq: Every evening | ORAL | Status: DC | PRN
  Administered 2014-12-12 – 2014-12-17 (×4): 100 mg via ORAL
  Filled 2014-12-12: qty 7
  Filled 2014-12-12 (×3): qty 1

## 2014-12-12 MED ORDER — LISINOPRIL 10 MG PO TABS: 10.0000 mg | ORAL_TABLET | Freq: Every day | ORAL | Status: DC

## 2014-12-12 MED ORDER — RIVAROXABAN 20 MG PO TABS
20.0000 mg | ORAL_TABLET | Freq: Every day | ORAL | Status: DC
  Administered 2014-12-12 – 2014-12-17 (×6): 20 mg via ORAL
  Filled 2014-12-12 (×3): qty 1
  Filled 2014-12-12: qty 7
  Filled 2014-12-12 (×5): qty 1

## 2014-12-12 MED ORDER — ACETAMINOPHEN 325 MG PO TABS: 650.0000 mg | ORAL_TABLET | Freq: Four times a day (QID) | ORAL | Status: DC | PRN

## 2014-12-12 NOTE — ED Notes (Signed)
Patient is A&Ox4, denies A/V hallucination. Patient denies HI but does admit to SI, states he feels like hurting himself but he can contract and promises not to harm himself while he is here. Patient is isolative to his room, presents with a flat affect, but does brighten on approach.  Traxler, Wyman Songster, RN

## 2014-12-12 NOTE — Progress Notes (Signed)
Patient ID: [name], male   DOB: 02-25-1976, 39 y.o.   MRN: [id number]   39 year old male presents to the hospital with complaints of "wanting to get into some kind of rehab, I wanted to die a day ago." Pt reports relapsing on crack cocaine after 2 months of sobriety. Pt reports that he also has been smoking marijuana and drinking 12 beers 3 to 4 times a week. 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 Pt reports no mental health hisotry but states " that's why I need to see a psychiatrist because something isn't right." Pt reports he also had a "bad temper" sometimes."   Pt has been cooperative and slightly irritable today. Consents signed, skin/belongings search completed and pt oriented to unit. Pt stable at this time. Pt given the opportunity to express concerns and ask questions. Pt given toiletries and meal. Will continue to monitor. Pt denies any pain currently.

## 2014-12-12 NOTE — Progress Notes (Signed)
CM spoke with pt who confirms uninsured [place] resident with no pcp.  CM discussed and provided written information for uninsured accepting pcps, discussed the importance of pcp vs EDP services for f/u care, www.needymeds.org, www.goodrx.com, discounted pharmacies and other [place] resources such as [facility] , [facility], affordable care act, financial assistance, uninsured dental services, [place] med assist, DSS and  health department  Reviewed resources for [place] uninsured accepting pcps like [facility], [facility] [address], [facility], [facility], local urgent care centers, [facility], [facility], general medical clinics, [facility], [facility] plus others, medication resources, [facility] out patient [facility]ies and housing Pt voiced understanding and appreciation of resources provided   Provided [facility] contact information Pt agreed to a referral Cm completed referral Pt to be contact by [facility] clinical liason  

## 2014-12-12 NOTE — Progress Notes (Signed)
*  PRELIMINARY RESULTS* Vascular Ultrasound Right lower extremity venous duplex has been completed.  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 Appears unchanged from previous study 04/09/14.  Gave verbal results to Asher Muir, NP  Behavioral ED.    Farrel Demark, RDMS, RVT  12/12/2014, 9:01 AM

## 2014-12-12 NOTE — Progress Notes (Deleted)
Recreation Therapy Notes  Date: 09.23.2016 Time: 9:30am Location: [address] Group room   Group Topic: Stress Management  Goal Area(s) Addresses:  Patient will actively participate in stress management techniques presented during session.   Behavioral Response: Did not attend.   Marykay Lex Blanchfield, LRT/CTRS  Blanchfield, Denise L 12/12/2014 1:45 PM

## 2014-12-12 NOTE — Progress Notes (Signed)
Pt attended AA group this evening.  

## 2014-12-12 NOTE — ED Notes (Signed)
Patient is being transferred to [facility] via [place]. Patient aware and willing, no complaints. Patient still passive SI but is able to contract for safety.  Traxler, Wyman Songster, RN

## 2014-12-12 NOTE — Progress Notes (Signed)
D Pt. Presently denies SI and HI, no complaints of pain or discomfort noted at present time. Pt. Admits he was having thoughts of OD on cocaine d/t losing his job and wife leaving him due to his cocaine abuse.  A Writer offered support and encouragement.  R Pt. Was given food and fluids. Pt. Remains safe on the unit and is presently resting quietly.

## 2014-12-12 NOTE — Progress Notes (Signed)
Patient ID: [name], male   DOB: Dec 08, 1975, 39 y.o.   MRN: [id number] Initial Interdisciplinary Treatment Plan   PATIENT STRESSORS: Health problems Substance abuse   PATIENT STRENGTHS: Average or above average intelligence Communication skills General fund of knowledge Motivation for treatment/growth Special hobby/interest   PROBLEM LIST: Problem List/Patient Goals Date to be addressed Date deferred Reason deferred Estimated date of resolution  "I need to see a psychiatrist" 12/12/2014  12/12/2014    "I relapsed after 2 months" 12/12/2014 12/12/2014    "I wanted to kill myself one day ago" 12/12/2014 12/12/2014                                         DISCHARGE CRITERIA:  Ability to meet basic life and health needs Improved stabilization in mood, thinking, and/or behavior Medical problems require only outpatient monitoring Withdrawal symptoms are absent or subacute and managed without 24-hour nursing intervention  PRELIMINARY DISCHARGE PLAN: Attend PHP/IOP Attend 12-step recovery group Participate in family therapy Placement in alternative living arrangements  PATIENT/FAMIILY INVOLVEMENT: This treatment plan has been presented to and reviewed with the patient, [name].The patient and family have been given the opportunity to ask questions and make suggestions.  Aurora Mask 12/12/2014, 4:25 PM

## 2014-12-12 NOTE — BH Assessment (Addendum)
Tele Assessment Note   [name] is an 39 y.o. male.  -Clinician reviewed note by Everlene Farrier, PA.  Patient had attempted to kill himself by overdosing on crack.  He had been clean for 3 months after going through [facility].  Patient relapsed over the last few weeks and has been using crack daily for the last week.  Wife is threatening to leave him.  She has three daughters (not by him) and he is upset at the prospect of losing her and not seeing grandchildren.  Patient still feels like he wants to kill himself.  Patient has been thinking about it off and on over the past 6 months.  Tonight he went to a friend's home and used 3-4 grams of cocaine in an effort to kill himself.  Patient says he tried to go to a local store to use a phone but he collapsed on the ground on the way.  Someone driving by called 010 and that is how he came to [facility].  Patient has had one previous suicide attempt.  He says at times he wants to harm others but has no current plan or intention.  Patient denies any A/V hallucinations.  Patient says that he has lost his jobs and is about to lose his marriage because of his drug use.  Patient is tearful at times during assessment.  Patient says he uses ETOH and has used some tonight.  Patient has gone to NA meetings off and on during the past few months.  Patient has no current psychiatric care.  He was at [facility] in February 2016.  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  He has not taken his xarelto in the last 8 months so a doppler has been ordered for this AM (09/23).  -Clinician discussed patient care with Hulan Fess, NP who recommends inpatient psychiatric care.  No beds available tonight, patient can be referred to other placements.  Will Dansie, PA is in agreement with this disposition.  Axis I: Substance Abuse and Substance Induced Mood Disorder Axis II: Deferred Axis III:  Past Medical History  Diagnosis Date  . PE (pulmonary embolism) 2011; 07/2011  . Syncope  and collapse 01/16/2012    "woke up to dog licking my face" (01/16/2012)  . 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 Cocaine abuse   . Family history of early CAD     Father deceased at 29 yo with MI  . Tobacco abuse    Axis IV: economic problems, occupational problems, other psychosocial or environmental problems, problems related to social environment and problems with primary support group Axis V: 31-40 impairment in reality testing  Past Medical History:  Past Medical History  Diagnosis Date  . PE (pulmonary embolism) 2011; 07/2011  . Syncope and collapse 01/16/2012    "woke up to dog licking my face" (01/16/2012)  . 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 Cocaine abuse   . Family history of early CAD     Father deceased at 69 yo with MI  . Tobacco abuse     Past Surgical History  Procedure Laterality Date  . No past surgeries      Family History:  Family History  Problem Relation Age of Onset  . Coronary artery disease Father 57    MI at age 25  . Colon cancer Paternal Grandmother   . Cancer Maternal Grandfather  Social History:  reports that he has been smoking Cigarettes.  He has a 2.5 pack-year smoking history. He has never used smokeless tobacco. He reports that he drinks alcohol. He reports that he uses illicit drugs (Cocaine and Marijuana).  Additional Social History:  Alcohol / Drug Use Pain Medications: None Prescriptions: None Over the Counter: None History of alcohol / drug use?: Yes Withdrawal Symptoms: Weakness, Fever / Chills Substance #1 Name of Substance 1: ETOH 1 - Age of First Use: 16 years ofa ge 1 - Amount (size/oz): A pint or so at a time 1 - Frequency: 3-4 times in a week. 1 - Duration: on-going 1 - Last Use / Amount: 09/22, drank a couple of 40's Substance #2 Name of Substance 2: Marijuana 2 - Age of First Use: Teens 2 - Amount (size/oz): a blunt at a time 2 - Frequency: once per month 2 -  Duration: on-going 2 - Last Use / Amount: Last week Substance #3 Name of Substance 3: Crack cocaine 3 - Age of First Use: 39 years of age 13 - Amount (size/oz): 3-7 grams in a day 3 - Frequency: Daily 3 - Duration: For the last week at this rate. 3 - Last Use / Amount: 09/22  CIWA: CIWA-Ar BP: 146/95 mmHg Pulse Rate: 79 COWS:    PATIENT STRENGTHS: (choose at least two) Average or above average intelligence Capable of independent living Communication skills Work skills  Allergies: No Known Allergies  Home Medications:  (Not in a hospital admission)  OB/GYN Status:  No LMP for male patient.  General Assessment Data Location of Assessment: [facility] TTS Assessment: In system Is this a Tele or Face-to-Face Assessment?: Face-to-Face Is this an Initial Assessment or a Re-assessment for this encounter?: Initial Assessment Marital status: Married Is patient pregnant?: No Pregnancy Status: No Living Arrangements: Spouse/significant other Can pt return to current living arrangement?: Yes Admission Status: Voluntary Is patient capable of signing voluntary admission?: Yes Referral Source: Other (A stranger called EMS when pt was found laying on the ground) Insurance type: None     Crisis Care Plan Living Arrangements: Spouse/significant other Name of Psychiatrist: None Name of Therapist: NOne  Education Status Is patient currently in school?: No Highest grade of school patient has completed: some college  Risk to self with the past 6 months Suicidal Ideation: Yes-Currently Present Has patient been a risk to self within the past 6 months prior to admission? : Yes Suicidal Intent: Yes-Currently Present Has patient had any suicidal intent within the past 6 months prior to admission? : Yes Is patient at risk for suicide?: Yes Suicidal Plan?: Yes-Currently Present Has patient had any suicidal plan within the past 6 months prior to admission? : Yes Specify Current Suicidal  Plan: Pt wants to overdose on cocaine Access to Means: Yes Specify Access to Suicidal Means: Can obtain cocaine What has been your use of drugs/alcohol within the last 12 months?: Crack, ETOH, THC Previous Attempts/Gestures: No How many times?: 0 Other Self Harm Risks: None Triggers for Past Attempts: None known Intentional Self Injurious Behavior: None Family Suicide History: No Recent stressful life event(s): Conflict (Comment), Job Loss, Other (Comment) (Patient feels his life is falling apart.) Persecutory voices/beliefs?: Yes Depression: Yes Depression Symptoms: Despondent, Guilt, Loss of interest in usual pleasures, Feeling worthless/self pity, Tearfulness, Insomnia, Isolating Substance abuse history and/or treatment for substance abuse?: Yes Suicide prevention information given to non-admitted patients: Not applicable  Risk to Others within the past 6 months Homicidal Ideation: No-Not  Currently/Within Last 6 Months Does patient have any lifetime risk of violence toward others beyond the six months prior to admission? : No Thoughts of Harm to Others: No-Not Currently Present/Within Last 6 Months Current Homicidal Intent: No Current Homicidal Plan: No Access to Homicidal Means: No Identified Victim: No one History of harm to others?: Yes Assessment of Violence: In distant past Violent Behavior Description: Fights growing up Does patient have access to weapons?: Yes (Comment) (Knives, baseball bats, etc.) Criminal Charges Pending?: No Does patient have a court date: No Is patient on probation?: No  Psychosis Hallucinations: None noted Delusions: None noted  Mental Status Report Appearance/Hygiene: Disheveled, In scrubs Eye Contact: Fair Motor Activity: Freedom of movement, Unremarkable Speech: Logical/coherent, Soft Level of Consciousness: Drowsy Mood: Depressed, Despair, Empty, Guilty, Helpless, Sad Affect: Depressed, Blunted, Sad Anxiety Level: Moderate Thought  Processes: Coherent, Relevant Judgement: Impaired Orientation: Person, Place, Time, Situation Obsessive Compulsive Thoughts/Behaviors: None  Cognitive Functioning Concentration: Decreased Memory: Recent Intact, Remote Intact IQ: Average Insight: Fair Impulse Control: Poor Appetite: Poor Weight Loss: 0 Weight Gain: 0 Sleep: Decreased Total Hours of Sleep:  (<4H/D) Vegetative Symptoms: None  ADLScreening [facility] Assessment Services) Patient's cognitive ability adequate to safely complete daily activities?: Yes Patient able to express need for assistance with ADLs?: Yes Independently performs ADLs?: Yes (appropriate for developmental age)  Prior Inpatient Therapy Prior Inpatient Therapy: Yes Prior Therapy Dates: February 2016 Prior Therapy Facilty/Provider(s): [facility] Reason for Treatment: SA  Prior Outpatient Therapy Prior Outpatient Therapy: No Prior Therapy Dates: None Prior Therapy Facilty/Provider(s): None Reason for Treatment: None Does patient have an ACCT team?: No Does patient have Intensive In-House Services?  : No Does patient have [facility] services? : No Does patient have P4CC services?: No  ADL Screening (condition at time of admission) Patient's cognitive ability adequate to safely complete daily activities?: Yes Is the patient deaf or have difficulty hearing?: No Does the patient have difficulty seeing, even when wearing glasses/contacts?: No Does the patient have difficulty concentrating, remembering, or making decisions?: Yes Patient able to express need for assistance with ADLs?: Yes Does the patient have difficulty dressing or bathing?: No Independently performs ADLs?: Yes (appropriate for developmental age) Does the patient have difficulty walking or climbing stairs?: No Weakness of Legs: None Weakness of Arms/Hands: None       Abuse/Neglect Assessment (Assessment to be complete while patient is alone) Physical Abuse: Denies Verbal Abuse: Yes, past  (Comment) (Some verbal abuse growing up.) Sexual Abuse: Denies Exploitation of patient/patient's resources: Denies Self-Neglect: Denies     Merchant navy officer (For Healthcare) Does patient have an advance directive?: No Would patient like information on creating an advanced directive?: No - patient declined information    Additional Information 1:1 In Past 12 Months?: No CIRT Risk: No Elopement Risk: No Does patient have medical clearance?: Yes     Disposition:  Disposition Initial Assessment Completed for this Encounter: Yes Disposition of Patient: Inpatient treatment program, Referred to Type of inpatient treatment program: Adult Patient referred to:  (Pt to be reviewed by NP.)  Beatriz Stallion Ray 12/12/2014 12:57 AM

## 2014-12-12 NOTE — Progress Notes (Signed)
D: Jolon is endorsing depression and sadness due to his recent "relapse". His personal stressors are related to his current marital discord with his wife and his job situation. Rates anxiety 5/10 and depression 8.5/10. He has a strong desire to not relapse once he is discharged. He has a plan and is very interested in a treatment facility that has a 60 day program. He was in a 30 day IP treatment program before that was part of [facility] and he does not desire to return to this facility. States it was like a "prison". He has also self identified his weaknesses and knows he will need long term OP treatment and psychotherapy after the 60 day IP program. He states "If I don't have psychotherapy on a continuous basis I will likely relapse. I praised him for identifying his weak areas. Encouraged him to identify his strong areas and coping skills he will need upon discharge. A: Encouraged Jayland to continue attending group and participating fully. Setting short term realistic goals and establishing a strong support system. R: Continue to monitor patient for safety and medication effectiveness.

## 2014-12-12 NOTE — ED Notes (Signed)
Bed: [facility] Expected date:  Expected time:  Means of arrival:  Comments: rm 13

## 2014-12-13 DIAGNOSIS — R45851 Suicidal ideations: Secondary | ICD-10-CM

## 2014-12-13 DIAGNOSIS — T405X2A Poisoning by cocaine, intentional self-harm, initial encounter: Secondary | ICD-10-CM

## 2014-12-13 DIAGNOSIS — T1491 Suicide attempt: Secondary | ICD-10-CM

## 2014-12-13 DIAGNOSIS — F1494 Cocaine use, unspecified with cocaine-induced mood disorder: Principal | ICD-10-CM

## 2014-12-13 MED ORDER — QUETIAPINE FUMARATE 25 MG PO TABS
25.0000 mg | ORAL_TABLET | Freq: Two times a day (BID) | ORAL | Status: DC
  Administered 2014-12-13 – 2014-12-15 (×5): 25 mg via ORAL
  Filled 2014-12-13 (×8): qty 1

## 2014-12-13 MED ORDER — FLUOXETINE HCL 20 MG PO CAPS
20.0000 mg | ORAL_CAPSULE | Freq: Every day | ORAL | Status: DC
  Administered 2014-12-13 – 2014-12-18 (×6): 20 mg via ORAL
  Filled 2014-12-13 (×3): qty 1
  Filled 2014-12-13: qty 7
  Filled 2014-12-13 (×4): qty 1

## 2014-12-13 NOTE — Progress Notes (Signed)
D: Pt presents with flat affect and depressed mood. Pt rates depression 7/10. Hopeless 6/10. Anxiety 5/10. Pt denies suicidal thoughts. Pt reported martial issues with his wife. Pt has been in bed all morning. Pt have no interaction on the unit and did not attend any groups. Pt compliant with starting new med, Prozac. Pt stated goal, "get understanding of how I can get better".  A: Medications reviewed with pt. Medications administered as ordered per MD. Verbal support given. Pt encouraged to attend groups. R: Pt receptive to tx.

## 2014-12-13 NOTE — Plan of Care (Signed)
Problem: Ineffective individual coping Goal: STG: Patient will remain free from self harm Outcome: Progressing Pt denies self harm thoughts at this time.

## 2014-12-13 NOTE — Progress Notes (Signed)
D: [name] has been in his room sleeping most of the shift. His mood is more somber. He denies SI/HI/AVH. He is minimally communicative. He did not attend group this evening.  A:  Encouraged Davin to try to attend all groups tomorrow as well as work on his goals for discharge. R: Continue to monitor patient for safety.

## 2014-12-13 NOTE — Progress Notes (Signed)
Pt did not attend AA group this evening.  

## 2014-12-13 NOTE — [facility] Suicide Risk Assessment (Signed)
[facility] Admission Suicide Risk Assessment   Nursing information obtained from:    Demographic factors:    Current Mental Status:    Loss Factors:    Historical Factors:    Risk Reduction Factors:    Total Time spent with patient: 1 hour Principal Problem: Cocaine-induced mood disorder Diagnosis:   Patient Active Problem List   Diagnosis Date Noted  . Cocaine abuse [F14.10] 12/12/2014  . 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 Chest pain [R07.9] 01/23/2012  . Syncope [R55] 01/16/2012  . DVT (deep venous thrombosis) [I82.409] 01/16/2012  . Long term (current) use of anticoagulants [Z79.01] 08/16/2011  . TOBACCO USER [Z72.0] 12/27/2008  . NONDEPENDENT COCAINE ABUSE EPISODIC [F14.10] 10/01/2008  . PULMONARY EMBOLISM [I26.99] 08/26/2008     Continued Clinical Symptoms:  Alcohol Use Disorder Identification Test Final Score (AUDIT): 14 The "Alcohol Use Disorders Identification Test", Guidelines for Use in Primary Care, Second Edition.  World Science writer [facility]). Score between 0-7:  no or low risk or alcohol related problems. Score between 8-15:  moderate risk of alcohol related problems. Score between 16-19:  high risk of alcohol related problems. Score 20 or above:  warrants further diagnostic evaluation for alcohol dependence and treatment.   CLINICAL FACTORS:   Severe Anxiety and/or Agitation Depression:   Comorbid alcohol abuse/dependence Impulsivity Insomnia Recent sense of peace/wellbeing Severe Alcohol/Substance Abuse/Dependencies More than one psychiatric diagnosis Unstable or Poor Therapeutic Relationship Previous Psychiatric Diagnoses and Treatments Medical Diagnoses and Treatments/Surgeries   Musculoskeletal: Strength & Muscle Tone: decreased Gait & Station: normal Patient leans: N/A  Psychiatric Specialty Exam: Physical Exam  ROS  Blood pressure 124/86, pulse 87, temperature 97.6 F (36.4 C), temperature source Oral, resp. rate 16, height   (1.854 m), weight 117.935 kg (260 lb), SpO2 97 %.Body mass index is 34.31 kg/(m^2).     COGNITIVE FEATURES THAT CONTRIBUTE TO RISK:  Closed-mindedness, Loss of executive function, Polarized thinking and Thought constriction (tunnel vision)    SUICIDE RISK:   Moderate:  Frequent suicidal ideation with limited intensity, and duration, some specificity in terms of plans, no associated intent, good self-control, limited dysphoria/symptomatology, some risk factors present, and identifiable protective factors, including available and accessible social support.  PLAN OF CARE: ADMIT   Medical Decision Making:  New problem, with additional work up planned, Review of Psycho-Social Stressors (1), Review or order clinical lab tests (1), Established Problem, Worsening (2), Review of Last Therapy Session (1), Review or order medicine tests (1), Review of Medication Regimen & Side Effects (2) and Review of New Medication or Change in Dosage (2)  I certify that inpatient services furnished can reasonably be expected to improve the patient's condition.   JONNALAGADDA,JANARDHAHA R. 12/13/2014, 11:29 AM

## 2014-12-13 NOTE — Progress Notes (Signed)
Adult Psychoeducational Group Note  Date:  12/13/2014 Time:  0900  Group Topic/Focus:  Making Healthy Choices:   The focus of this group is to help patients identify negative/unhealthy choices they were using prior to admission and identify positive/healthier coping strategies to replace them upon discharge.  Participation Level:  Did Not Attend  Participation Quality:    Affect:    Cognitive:    Insight:   Engagement in Group:    Modes of Intervention:    Additional Comments:    Lawrnce Reyez L 12/13/2014, 10:34 AM

## 2014-12-13 NOTE — H&P (Signed)
Psychiatric Admission Assessment Adult  Patient Identification: [name] MRN:  [id number] Date of Evaluation:  12/13/2014 Chief Complaint:  Substance Induced Mood Disorder Principal Diagnosis: Cocaine-induced mood disorder Diagnosis:   Patient Active Problem List   Diagnosis Date Noted  . Cocaine abuse [F14.10] 12/12/2014  . 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 Chest pain [R07.9] 01/23/2012  . Syncope [R55] 01/16/2012  . DVT (deep venous thrombosis) [I82.409] 01/16/2012  . Long term (current) use of anticoagulants [Z79.01] 08/16/2011  . TOBACCO USER [Z72.0] 12/27/2008  . NONDEPENDENT COCAINE ABUSE EPISODIC [F14.10] 10/01/2008  . 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 Male admitted for increased symptoms of depression and attempted to kill himself by overdosing on crack. He had been clean for 3 months after going through [facility] in Feb 2016. Patient relapsed over the last few weeks and has been using crack daily for the last week. Wife is threatening to leave him and not sure he can go back to home. She has three step-daughters and he is upset at the prospect of losing her and not seeing grandchildren. Patient still feels like he wants to kill himself but denied current plan. Patient has been thinking about it off and on over the past 6 months. patient stated that he went to a friend's home and used 3-4 grams of cocaine in an effort to kill himself. Patient says he tried to go to a local store to use a phone but he collapsed on the ground on the way.Patient has had one previous suicide attempt. He says at times he wants to harm others but has no current plan or intention. Patient denies any A/V hallucinations. Patient has lost his jobs and is about to lose his marriage because of his drug use. Patient uses ETOH and has used some tonight. Patient has gone to NA meetings off and on during the past few months.  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 He has not taken his xarelto in the last 8 months so a doppler has been ordered for this AM (09/23). His BAL is less than 5 and UDS is positive for cocaine.   Elements:  Location:  substance abuse and depression. Quality:  poor. Severity:  suicide ideation and behaviors. Timing:  loss of job and relationship. Duration:  few days. Context:  psychosocial stresses and substance abuse. Associated Signs/Symptoms: Depression Symptoms:  depressed mood, anhedonia, psychomotor retardation, fatigue, feelings of worthlessness/guilt, hopelessness, suicidal thoughts with specific plan, suicidal attempt, disturbed sleep, weight loss, decreased labido, decreased appetite, (Hypo) Manic Symptoms:  Distractibility, Elevated Mood, Impulsivity, Irritable Mood, Labiality of Mood, Anxiety Symptoms:  Excessive Worry, Psychotic Symptoms:  n/a PTSD Symptoms: NA Total Time spent with patient: 1 hour  Past Medical History:  Past Medical History  Diagnosis Date  . PE (pulmonary embolism) 2011; 07/2011  . Syncope and collapse 01/16/2012    "woke up to dog licking my face" ([id number]/81/[id number])  . 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 Cocaine abuse   . Family history of early CAD     Father deceased at 32 yo with MI  . Tobacco abuse     Past Surgical History  Procedure Laterality Date  . No past surgeries     Family History:  Family History  Problem Relation Age of Onset  . Coronary artery disease Father 62    MI at  age 39  . Colon cancer Paternal Grandmother   . Cancer Maternal Grandfather    Social History:  History  Alcohol Use  . Yes    Comment: occasion     History  Drug Use  . Yes  . Special: Cocaine, Marijuana    Social History   Social History  . Marital Status: Single    Spouse Name: N/A  . Number of Children: 0  . Years of Education: college   Occupational  History  . Musician     plays piano   Social History Main Topics  . Smoking status: Current Every Day Smoker -- 0.50 packs/day for 5 years    Types: Cigarettes  . Smokeless tobacco: Never Used  . Alcohol Use: Yes     Comment: occasion  . Drug Use: Yes    Special: Cocaine, Marijuana  . Sexual Activity: Yes   Other Topics Concern  . None   Social History Narrative   Lives in [place] by himself.   Additional Social History:    Pain Medications: none Prescriptions: xarelto Over the Counter: none History of alcohol / drug use?: Yes Longest period of sobriety (when/how long): 2 months Negative Consequences of Use: Financial, Scientist, research (physical sciences), Personal relationships, Work / School Withdrawal Symptoms: Agitation, Fever / Chills, Change in blood pressure Name of Substance 1: ETOH 1 - Age of First Use: 28 yoa 1 - Amount (size/oz): 12 beers 1 - Frequency: 3-4 times a week 1 - Duration: on-going 1 - Last Use / Amount: 09/22 Name of Substance 2: Marijuana 2 - Age of First Use: [age] 2 - Amount (size/oz): a blunt at a time 2 - Frequency: once per month 2 - Duration: on-going 2 - Last Use / Amount: Last week Name of Substance 3: Crack cocaine 3 - Age of First Use: 39 years of age 43 - Amount (size/oz): 3-7 grams in a day 3 - Frequency: Daily 3 - Duration: For the last week at this rate. 3 - Last Use / Amount: 09/22               Musculoskeletal: Strength & Muscle Tone: within normal limits Gait & Station: normal Patient leans: N/A  Psychiatric Specialty Exam: Physical Exam Full physical performed in Emergency Department. I have reviewed this assessment and concur with its findings.   ROS depression, anxiety and mood swings No Fever-chills, No Headache, No changes with Vision or hearing, reports vertigo No problems swallowing food or Liquids, No Chest pain, Cough or Shortness of Breath, No Abdominal pain, No Nausea or Vommitting, Bowel movements are regular, No Blood in  stool or Urine, No dysuria, No new skin rashes or bruises, No new joints pains-aches,  No new weakness, tingling, numbness in any extremity, No recent weight gain or loss, No polyuria, polydypsia or polyphagia,   A full 10 point Review of Systems was done, except as stated above, all other Review of Systems were negative.  Blood pressure 124/86, pulse 87, temperature 97.6 F (36.4 C), temperature source Oral, resp. rate 16, height _0  (1.854 m), weight 117.935 kg (260 lb), SpO2 97 %.Body mass index is 34.31 kg/(m^2).  General Appearance: Guarded  Eye Contact::  Fair  Speech:  Clear and Coherent and Slow  Volume:  Decreased  Mood:  Anxious, Depressed and Irritable  Affect:  Depressed and Labile  Thought Process:  Coherent and Goal Directed  Orientation:  Full (Time, Place, and Person)  Thought Content:  WDL  Suicidal Thoughts:  Yes.  with intent/plan  Homicidal Thoughts:  No  Memory:  Immediate;   Good Recent;   Good  Judgement:  Impaired  Insight:  Fair  Psychomotor Activity:  Decreased  Concentration:  Fair  Recall:  Good  Fund of Knowledge:Good  Language: Good  Akathisia:  Negative  Handed:  Right  AIMS (if indicated):     Assets:  Communication Skills Desire for Improvement Financial Resources/Insurance Housing Leisure Time Physical Health Resilience Social Support Transportation  ADL's:  Intact  Cognition: WNL  Sleep:  Number of Hours: 5.75   Risk to Self: Is patient at risk for suicide?: Yes Risk to Others:   Prior Inpatient Therapy:   Prior Outpatient Therapy:    Alcohol Screening: 1. How often do you have a drink containing alcohol?: 2 to 3 times a week 2. How many drinks containing alcohol do you have on a typical day when you are drinking?: 10 or more 3. How often do you have six or more drinks on one occasion?: Weekly Preliminary Score: 7 4. How often during the last year have you found that you were not able to stop drinking once you had  started?: Never 5. How often during the last year have you failed to do what was normally expected from you becasue of drinking?: Never 6. How often during the last year have you needed a first drink in the morning to get yourself going after a heavy drinking session?: Never 7. How often during the last year have you had a feeling of guilt of remorse after drinking?: Monthly 8. How often during the last year have you been unable to remember what happened the night before because you had been drinking?: Monthly 9. Have you or someone else been injured as a result of your drinking?: No 10. Has a relative or friend or a doctor or another health worker been concerned about your drinking or suggested you cut down?: No Alcohol Use Disorder Identification Test Final Score (AUDIT): 14 Brief Intervention: Yes  Allergies:  No Known Allergies Lab Results:  Results for orders placed or performed during the hospital encounter of 12/11/14 (from the past 48 hour(s))  CBC     Status: None   Collection Time: 12/11/14  9:32 PM  Result Value Ref Range   WBC 8.9 4.0 - 10.5 K/uL   RBC 5.26 4.22 - 5.81 MIL/uL   Hemoglobin 15.4 13.0 - 17.0 g/dL   HCT 45.4 39.0 - 52.0 %   MCV 86.3 78.0 - 100.0 fL   MCH 29.3 26.0 - 34.0 pg   MCHC 33.9 30.0 - 36.0 g/dL   RDW 14.9 11.5 - 15.5 %   Platelets 159 150 - 400 K/uL  Comprehensive metabolic panel     Status: Abnormal   Collection Time: 12/11/14  9:32 PM  Result Value Ref Range   Sodium 140 135 - 145 mmol/L   Potassium 4.0 3.5 - 5.1 mmol/L   Chloride 106 101 - 111 mmol/L   CO2 28 22 - 32 mmol/L   Glucose, Bld 101 (H) 65 - 99 mg/dL   BUN 9 6 - 20 mg/dL   Creatinine, Ser 1.47 (H) 0.61 - 1.24 mg/dL   Calcium 9.1 8.9 - 10.3 mg/dL   Total Protein 7.4 6.5 - 8.1 g/dL   Albumin 4.3 3.5 - 5.0 g/dL   AST 25 15 - 41 U/L   ALT 30 17 - 63 U/L   Alkaline Phosphatase 53 38 - 126 U/L   Total Bilirubin 0.7  0.3 - 1.2 mg/dL   GFR calc non Af Amer 59 (L) >60 mL/min   GFR calc Af  Amer >60 >60 mL/min    Comment: (NOTE) The eGFR has been calculated using the CKD EPI equation. This calculation has not been validated in all clinical situations. eGFR's persistently <60 mL/min signify possible Chronic Kidney Disease.    Anion gap 6 5 - 15  Ethanol     Status: None   Collection Time: 12/11/14  9:32 PM  Result Value Ref Range   Alcohol, Ethyl (B) <5 <5 mg/dL    Comment:        LOWEST DETECTABLE LIMIT FOR SERUM ALCOHOL IS 5 mg/dL FOR MEDICAL PURPOSES ONLY   Acetaminophen level     Status: Abnormal   Collection Time: 12/11/14  9:32 PM  Result Value Ref Range   Acetaminophen (Tylenol), Serum <10 (L) 10 - 30 ug/mL    Comment:        THERAPEUTIC CONCENTRATIONS VARY SIGNIFICANTLY. A RANGE OF 10-30 ug/mL MAY BE AN EFFECTIVE CONCENTRATION FOR MANY PATIENTS. HOWEVER, SOME ARE BEST TREATED AT CONCENTRATIONS OUTSIDE THIS RANGE. ACETAMINOPHEN CONCENTRATIONS >150 ug/mL AT 4 HOURS AFTER INGESTION AND >50 ug/mL AT 12 HOURS AFTER INGESTION ARE OFTEN ASSOCIATED WITH TOXIC REACTIONS.   Salicylate level     Status: None   Collection Time: 12/11/14  9:32 PM  Result Value Ref Range   Salicylate Lvl <6.9 2.8 - 30.0 mg/dL  I-stat troponin, ED     Status: None   Collection Time: 12/11/14  9:37 PM  Result Value Ref Range   Troponin i, poc 0.00 0.00 - 0.08 ng/mL   Comment 3            Comment: Due to the release kinetics of cTnI, a negative result within the first hours of the onset of symptoms does not rule out myocardial infarction with certainty. If myocardial infarction is still suspected, repeat the test at appropriate intervals.   Urine rapid drug screen (hosp performed)     Status: Abnormal   Collection Time: 12/11/14 10:04 PM  Result Value Ref Range   Opiates NONE DETECTED NONE DETECTED   Cocaine POSITIVE (A) NONE DETECTED   Benzodiazepines NONE DETECTED NONE DETECTED   Amphetamines NONE DETECTED NONE DETECTED   Tetrahydrocannabinol NONE DETECTED NONE  DETECTED   Barbiturates NONE DETECTED NONE DETECTED    Comment:        DRUG SCREEN FOR MEDICAL PURPOSES ONLY.  IF CONFIRMATION IS NEEDED FOR ANY PURPOSE, NOTIFY LAB WITHIN 5 DAYS.        LOWEST DETECTABLE LIMITS FOR URINE DRUG SCREEN Drug Class       Cutoff (ng/mL) Amphetamine      1000 Barbiturate      200 Benzodiazepine   485 Tricyclics       462 Opiates          300 Cocaine          300 THC              50    Current Medications: Current Facility-Administered Medications  Medication Dose Route Frequency Provider Last Rate Last Dose  . acetaminophen (TYLENOL) tablet 650 mg  650 mg Oral Q6H PRN Patrecia Pour, NP      . alum & mag hydroxide-simeth (MAALOX/MYLANTA) 200-200-20 MG/5ML suspension 30 mL  30 mL Oral Q4H PRN Patrecia Pour, NP      . lisinopril (PRINIVIL,ZESTRIL) tablet 10 mg  10 mg Oral Daily Herbert Pun  I Nwoko, NP   10 mg at 12/13/14 0844  . magnesium hydroxide (MILK OF MAGNESIA) suspension 30 mL  30 mL Oral Daily PRN Patrecia Pour, NP      . nicotine (NICODERM CQ - dosed in mg/24 hours) patch 21 mg  21 mg Transdermal Daily Patrecia Pour, NP   21 mg at 12/12/14 2147  . ondansetron (ZOFRAN) tablet 4 mg  4 mg Oral Q8H PRN Patrecia Pour, NP      . rivaroxaban Alveda Reasons) tablet 20 mg  20 mg Oral Q supper Encarnacion Slates, NP   20 mg at 12/12/14 1629  . traZODone (DESYREL) tablet 100 mg  100 mg Oral QHS PRN Harriet Butte, NP   100 mg at 12/12/14 2332   PTA Medications: Prescriptions prior to admission  Medication Sig Dispense Refill Last Dose  . ibuprofen (ADVIL,MOTRIN) 800 MG tablet Take 1 tablet (800 mg total) by mouth 3 (three) times daily. (Patient not taking: Reported on 12/11/2014) 21 tablet 0 Not Taking at Unknown time  . XARELTO STARTER PACK 15 & 20 MG TBPK Take 15-20 mg by mouth as directed. Take as directed on package: Start with one 66m tablet by mouth twice a day with food. On Day 22, switch to one 2102mtablet once a day with food. (Patient not taking: Reported on  07/23/2014) 51 each 0 Not Taking at Unknown time    Previous Psychotropic Medications: No   Substance Abuse History in the last 12 months:  Yes.      Consequences of Substance Abuse: NA  Results for orders placed or performed during the hospital encounter of 12/11/14 (from the past 72 hour(s))  CBC     Status: None   Collection Time: 12/11/14  9:32 PM  Result Value Ref Range   WBC 8.9 4.0 - 10.5 K/uL   RBC 5.26 4.22 - 5.81 MIL/uL   Hemoglobin 15.4 13.0 - 17.0 g/dL   HCT 45.4 39.0 - 52.0 %   MCV 86.3 78.0 - 100.0 fL   MCH 29.3 26.0 - 34.0 pg   MCHC 33.9 30.0 - 36.0 g/dL   RDW 14.9 11.5 - 15.5 %   Platelets 159 150 - 400 K/uL  Comprehensive metabolic panel     Status: Abnormal   Collection Time: 12/11/14  9:32 PM  Result Value Ref Range   Sodium 140 135 - 145 mmol/L   Potassium 4.0 3.5 - 5.1 mmol/L   Chloride 106 101 - 111 mmol/L   CO2 28 22 - 32 mmol/L   Glucose, Bld 101 (H) 65 - 99 mg/dL   BUN 9 6 - 20 mg/dL   Creatinine, Ser 1.47 (H) 0.61 - 1.24 mg/dL   Calcium 9.1 8.9 - 10.3 mg/dL   Total Protein 7.4 6.5 - 8.1 g/dL   Albumin 4.3 3.5 - 5.0 g/dL   AST 25 15 - 41 U/L   ALT 30 17 - 63 U/L   Alkaline Phosphatase 53 38 - 126 U/L   Total Bilirubin 0.7 0.3 - 1.2 mg/dL   GFR calc non Af Amer 59 (L) >60 mL/min   GFR calc Af Amer >60 >60 mL/min    Comment: (NOTE) The eGFR has been calculated using the CKD EPI equation. This calculation has not been validated in all clinical situations. eGFR's persistently <60 mL/min signify possible Chronic Kidney Disease.    Anion gap 6 5 - 15  Ethanol     Status: None   Collection Time: 12/11/14  9:32 PM  Result Value Ref Range   Alcohol, Ethyl (B) <5 <5 mg/dL    Comment:        LOWEST DETECTABLE LIMIT FOR SERUM ALCOHOL IS 5 mg/dL FOR MEDICAL PURPOSES ONLY   Acetaminophen level     Status: Abnormal   Collection Time: 12/11/14  9:32 PM  Result Value Ref Range   Acetaminophen (Tylenol), Serum <10 (L) 10 - 30 ug/mL    Comment:         THERAPEUTIC CONCENTRATIONS VARY SIGNIFICANTLY. A RANGE OF 10-30 ug/mL MAY BE AN EFFECTIVE CONCENTRATION FOR MANY PATIENTS. HOWEVER, SOME ARE BEST TREATED AT CONCENTRATIONS OUTSIDE THIS RANGE. ACETAMINOPHEN CONCENTRATIONS >150 ug/mL AT 4 HOURS AFTER INGESTION AND >50 ug/mL AT 12 HOURS AFTER INGESTION ARE OFTEN ASSOCIATED WITH TOXIC REACTIONS.   Salicylate level     Status: None   Collection Time: 12/11/14  9:32 PM  Result Value Ref Range   Salicylate Lvl <8.3 2.8 - 30.0 mg/dL  I-stat troponin, ED     Status: None   Collection Time: 12/11/14  9:37 PM  Result Value Ref Range   Troponin i, poc 0.00 0.00 - 0.08 ng/mL   Comment 3            Comment: Due to the release kinetics of cTnI, a negative result within the first hours of the onset of symptoms does not rule out myocardial infarction with certainty. If myocardial infarction is still suspected, repeat the test at appropriate intervals.   Urine rapid drug screen (hosp performed)     Status: Abnormal   Collection Time: 12/11/14 10:04 PM  Result Value Ref Range   Opiates NONE DETECTED NONE DETECTED   Cocaine POSITIVE (A) NONE DETECTED   Benzodiazepines NONE DETECTED NONE DETECTED   Amphetamines NONE DETECTED NONE DETECTED   Tetrahydrocannabinol NONE DETECTED NONE DETECTED   Barbiturates NONE DETECTED NONE DETECTED    Comment:        DRUG SCREEN FOR MEDICAL PURPOSES ONLY.  IF CONFIRMATION IS NEEDED FOR ANY PURPOSE, NOTIFY LAB WITHIN 5 DAYS.        LOWEST DETECTABLE LIMITS FOR URINE DRUG SCREEN Drug Class       Cutoff (ng/mL) Amphetamine      1000 Barbiturate      200 Benzodiazepine   419 Tricyclics       622 Opiates          300 Cocaine          300 THC              50     Observation Level/Precautions:  15 minute checks  Laboratory:  Reviewed admission labs, UDS is positive for cocaine  Psychotherapy:  Substance abuse counseling and group therapies  Medications:  Seroquel 25 mg BID for agitation and  fluoxetine 20 mg for depression and trazodone 50 mg Qhs for insomnia.  Consultations:  none  Discharge Concerns:  safety  Estimated LOS: 4-7 days  Other:     Psychological Evaluations: Yes   Treatment Plan Summary: Daily contact with patient to assess and evaluate symptoms and progress in treatment and Medication management  Medical Decision Making:  Review of Psycho-Social Stressors (1), Review or order clinical lab tests (1), Established Problem, Worsening (2), Review of Last Therapy Session (1), Review or order medicine tests (1), Review of Medication Regimen & Side Effects (2) and Review of New Medication or Change in Dosage (2)  I certify that inpatient services furnished can reasonably be expected to  improve the patient's condition.   JONNALAGADDA,JANARDHAHA R. 9/24/201611:31 AM

## 2014-12-13 NOTE — [facility] Group Notes (Signed)
[facility] LCSW Group Therapy  12/13/2014  10:10 - 11:00 AM  Type of Therapy:  Group Therapy  Participation Level:  Did Not Attend although encouraged to by MHT   Summary of Progress/Problems: The main focus of today's process group was for the patient to identify ways in which they have in the past sabotaged their own recovery. Motivational Interviewing was utilized to ask the group members what they get out of their substance use, and what reasons they may have for wanting to change. The Stages of Change were explained using a handout, and patients identified where they currently are with regard to stages of change.  Cowan, James Campbell    

## 2014-12-14 NOTE — [facility] Group Notes (Signed)
[facility] Group Notes:  (Nursing/MHT/Case Management/Adjunct)  Date:  12/14/2014  Time:  0900  Type of Therapy:  Nurse Education  Participation Level:  Did Not Attend  Participation Quality:     Affect:    Cognitive:    Insight:    Engagement in Group:    Modes of Intervention:    Summary of Progress/Problems:  Maurine Simmering 12/14/2014, 9:27 AM

## 2014-12-14 NOTE — Progress Notes (Signed)
D: Pt calm and cooperative upon approach this a.m. He was uncertain of the indication of each of his a.m meds. He did not attend 0900 group. He rated depression, anxiety, and hopelessness at 10. He denied SI when speaking with this Clinical research associate but checked "yes" and "no" on the self-inventory question. Pt contracted for safety. He wrote that he wants to work on getting into a rehab and talking to his wife. "I pray she comes for a visit."  A: Meds given as ordered, with indications explained. Q15 safety checks maintained. Support/encouragement offered. R: Pt remains free from harm and continues with treatment. Will continue to monitor for needs/safety.

## 2014-12-14 NOTE — Progress Notes (Signed)
[facility] MD Progress Note  12/14/2014 2:33 PM [name]  MRN:  [id number]   Subjective:  [name] is an 39 y.o. Male admitted for increased symptoms of depression and attempted to kill himself by overdosing on crack. He had been clean for 3 months after going through [facility] in Feb 2016. Patient relapsed over the last few weeks and has been using crack daily for the last week. Wife is threatening to leave him and not sure he can go back to home.   Patient has been depressed, isolated, withdrawn, no motivation or energy, staying in his bed most of the day with the limited participation in the therapeutic milieu. Patient speaks with a low [name] and denies current suicidal or homicidal ideation but cooperative with taking medication management. Patient is encouraged to participate in therapeutic milieu and the clinical activities today.    Principal Problem: Cocaine-induced mood disorder Diagnosis:   Patient Active Problem List   Diagnosis Date Noted  . Cocaine abuse [F14.10] 12/12/2014  . 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 Chest pain [R07.9] 01/23/2012  . Syncope [R55] 01/16/2012  . DVT (deep venous thrombosis) [I82.409] 01/16/2012  . Long term (current) use of anticoagulants [Z79.01] 08/16/2011  . TOBACCO USER [Z72.0] 12/27/2008  . NONDEPENDENT COCAINE ABUSE EPISODIC [F14.10] 10/01/2008  . 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 PE (pulmonary embolism) 2011; 07/2011  . Syncope and collapse 01/16/2012    "woke up to dog licking my face" (01/16/2012)  . 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 Cocaine abuse   . Family history of early CAD     Father deceased at 54 yo with MI  . Tobacco abuse     Past Surgical History  Procedure Laterality Date  . No past surgeries     Family History:  Family History  Problem Relation Age of Onset   . Coronary artery disease Father 60    MI at age 72  . Colon cancer Paternal Grandmother   . Cancer Maternal Grandfather    Social History:  History  Alcohol Use  . Yes    Comment: occasion     History  Drug Use  . Yes  . Special: Cocaine, Marijuana    Social History   Social History  . Marital Status: Single    Spouse Name: N/A  . Number of Children: 0  . Years of Education: college   Occupational History  . Musician     plays piano   Social History Main Topics  . Smoking status: Current Every Day Smoker -- 0.50 packs/day for 5 years    Types: Cigarettes  . Smokeless tobacco: Never Used  . Alcohol Use: Yes     Comment: occasion  . Drug Use: Yes    Special: Cocaine, Marijuana  . Sexual Activity: Yes   Other Topics Concern  . None   Social History Narrative   Lives in [place] by himself.   Additional History:    Sleep: Good  Appetite:  Fair   Assessment:   Musculoskeletal: Strength & Muscle Tone: within normal limits Gait & Station: normal Patient leans: N/A   Psychiatric Specialty Exam: Physical Exam  ROS  Blood pressure 147/92, pulse 79, temperature 97.8 F (36.6 C), temperature source Oral, resp. rate 16, height  (1.854 m), weight 117.935 kg (260 lb), SpO2  97 %.Body mass index is 34.31 kg/(m^2).  General Appearance: Guarded  Eye Contact::  Fair  Speech:  Slow and Slurred  Volume:  Decreased  Mood:  Anxious, Depressed and Worthless  Affect:  Constricted and Depressed  Thought Process:  Coherent and Goal Directed  Orientation:  Full (Time, Place, and Person)  Thought Content:  WDL  Suicidal Thoughts:  Yes.  without intent/plan  Homicidal Thoughts:  No  Memory:  Immediate;   Fair Recent;   Fair  Judgement:  Impaired  Insight:  Shallow  Psychomotor Activity:  Decreased  Concentration:  Fair  Recall:  [employer] of Knowledge:Fair  Language: Fair  Akathisia:  Negative  Handed:  Right  AIMS (if indicated):     Assets:   Communication Skills Desire for Improvement Leisure Time Resilience Social Support  ADL's:  Intact  Cognition: WNL  Sleep:  Number of Hours: 6.5     Current Medications: Current Facility-Administered Medications  Medication Dose Route Frequency Provider Last Rate Last Dose  . acetaminophen (TYLENOL) tablet 650 mg  650 mg Oral Q6H PRN Charm Rings, NP      . alum & mag hydroxide-simeth (MAALOX/MYLANTA) 200-200-20 MG/5ML suspension 30 mL  30 mL Oral Q4H PRN Charm Rings, NP      . FLUoxetine (PROZAC) capsule 20 mg  20 mg Oral Daily Leata Mouse, MD   20 mg at 12/14/14 0745  . lisinopril (PRINIVIL,ZESTRIL) tablet 10 mg  10 mg Oral Daily Sanjuana Kava, NP   10 mg at 12/14/14 0745  . magnesium hydroxide (MILK OF MAGNESIA) suspension 30 mL  30 mL Oral Daily PRN Charm Rings, NP      . nicotine (NICODERM CQ - dosed in mg/24 hours) patch 21 mg  21 mg Transdermal Daily Charm Rings, NP   21 mg at 12/14/14 0745  . ondansetron (ZOFRAN) tablet 4 mg  4 mg Oral Q8H PRN Charm Rings, NP      . QUEtiapine (SEROQUEL) tablet 25 mg  25 mg Oral BID Leata Mouse, MD   25 mg at 12/14/14 0745  . rivaroxaban (XARELTO) tablet 20 mg  20 mg Oral Q supper Sanjuana Kava, NP   20 mg at 12/13/14 1721  . traZODone (DESYREL) tablet 100 mg  100 mg Oral QHS PRN Worthy Flank, NP   100 mg at 12/12/14 2332    Lab Results: No results found for this or any previous visit (from the past 48 hour(s)).  Physical Findings: AIMS: Facial and Oral Movements Muscles of Facial Expression: None, normal Lips and Perioral Area: None, normal Jaw: None, normal Tongue: None, normal,Extremity Movements Upper (arms, wrists, hands, fingers): None, normal Lower (legs, knees, ankles, toes): None, normal, Trunk Movements Neck, shoulders, hips: None, normal, Overall Severity Severity of abnormal movements (highest score from questions above): None, normal Incapacitation due to abnormal movements: None,  normal Patient's awareness of abnormal movements (rate only patient's report): No Awareness, Dental Status Current problems with teeth and/or dentures?: No Does patient usually wear dentures?: No  CIWA:  CIWA-Ar Total: 0 COWS:  COWS Total Score: 2  Treatment Plan Summary: Daily contact with patient to assess and evaluate symptoms and progress in treatment and Medication management  Fluoxetine 20 mg daily for depression Seroquel 25 mg twice daily for anxiety and agitation Lisinopril 10 mg daily for hypertension Nicoderm CQ 21 mg daily for nicotine cravings Xarelto 20 mg daily with supper as per history of DVT and chest pains  Monitor for the side effect of the medication Pancreas patient to participate in therapeutic milieu and group activities   Medical Decision Making:  Review of Psycho-Social Stressors (1), Review or order clinical lab tests (1), Established Problem, Worsening (2), Review of Last Therapy Session (1), Review or order medicine tests (1), Review of Medication Regimen & Side Effects (2) and Review of New Medication or Change in Dosage (2)     Zsofia Prout,JANARDHAHA R. 12/14/2014, 2:33 PM

## 2014-12-14 NOTE — Progress Notes (Signed)
D . Pt sitting in dayroom on approach, minimal interaction.  Denies SI/HI/hallucinations at this time.  Pt denies complaints, no acute distress noted.  A.  Support and encouragement offered, informed Pt of availability of medication to aide with sleep should he need it.  R.  Pt verbalized understanding, remains safe on unit.  Will continue to monitor.

## 2014-12-14 NOTE — [facility] Group Notes (Signed)
[facility] LCSW Group Therapy  12/14/2014 10:00 AM   Type of Therapy:  Group Therapy  Participation Level:  Did Not Attend  Sylvana Bonk Horton, LCSW 12/14/2014 10:57 AM  

## 2014-12-14 NOTE — Progress Notes (Signed)
Patient did not attend the evening speaker AA meeting. Pt was notified that group was beginning but remained in bed.   
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
## 2014-12-15 NOTE — Progress Notes (Signed)
[facility] MD Progress Note  12/15/2014 6:59 PM [name]  MRN:  [id number] Subjective:  [name] states he is having a very hard time. He is risking losing everything he has due to his cocaine use. 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 States he really wants to get his life back together. He feels the Seroquel is affecting him adversely as he has been groggy in bed all morning.  Principal Problem: Cocaine-induced mood disorder Diagnosis:   Patient Active Problem List   Diagnosis Date Noted  . Cocaine abuse [F14.10] 12/12/2014  . 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 Chest pain [R07.9] 01/23/2012  . Syncope [R55] 01/16/2012  . DVT (deep venous thrombosis) [I82.409] 01/16/2012  . Long term (current) use of anticoagulants [Z79.01] 08/16/2011  . TOBACCO USER [Z72.0] 12/27/2008  . NONDEPENDENT COCAINE ABUSE EPISODIC [F14.10] 10/01/2008  . 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 PE (pulmonary embolism) 2011; 07/2011  . Syncope and collapse 01/16/2012    "woke up to dog licking my face" (01/16/2012)  . 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 Cocaine abuse   . Family history of early CAD     Father deceased at 89 yo with MI  . Tobacco abuse     Past Surgical History  Procedure Laterality Date  . No past surgeries     Family History:  Family History  Problem Relation Age of Onset  . Coronary artery disease Father 92    MI at age 65  . Colon cancer Paternal Grandmother   . Cancer Maternal Grandfather    Family Psychiatric  History:  Social History:  History  Alcohol Use  . Yes    Comment: occasion     History  Drug Use  . Yes  . Special: Cocaine, Marijuana     Social History   Social History  . Marital Status: Single    Spouse Name: N/A  . Number of Children: 0  . Years of Education: college   Occupational History  . Musician     plays piano   Social History Main Topics  . Smoking status: Current Every Day Smoker -- 0.50 packs/day for 5 years    Types: Cigarettes  . Smokeless tobacco: Never Used  . Alcohol Use: Yes     Comment: occasion  . Drug Use: Yes    Special: Cocaine, Marijuana  . Sexual Activity: Yes   Other Topics Concern  . None   Social History Narrative   Lives in [place] by himself.   Additional Social History:    Pain Medications: none Prescriptions: xarelto Over the Counter: none History of alcohol / drug use?: Yes Longest period of sobriety (when/how long): 2 months Negative Consequences of Use: Financial, Armed forces operational officer, Personal relationships, Work / School Withdrawal Symptoms: Agitation, Fever / Chills, Change in blood pressure Name of Substance 1: ETOH 1 - Age of First Use: 16 yoa 1 - Amount (size/oz): 12 beers 1 - Frequency: 3-4 times a week 1 - Duration: on-going 1 - Last Use / Amount: 09/22 Name of Substance 2: Marijuana 2 - Age of First Use: [age] 2 - Amount (size/oz): a  blunt at a time 2 - Frequency: once per month 2 - Duration: on-going 2 - Last Use / Amount: Last week Name of Substance 3: Crack cocaine 3 - Age of First Use: 39 years of age 48 - Amount (size/oz): 3-7 grams in a day 3 - Frequency: Daily 3 - Duration: For the last week at this rate. 3 - Last Use / Amount: 09/22              Sleep: Fair  Appetite:  Fair  Current Medications: Current Facility-Administered Medications  Medication Dose Route Frequency Provider Last Rate Last Dose  . acetaminophen (TYLENOL) tablet 650 mg  650 mg Oral Q6H PRN Charm Rings, NP      . alum & mag hydroxide-simeth (MAALOX/MYLANTA) 200-200-20 MG/5ML suspension 30 mL  30 mL Oral Q4H PRN Charm Rings, NP      . FLUoxetine (PROZAC) capsule 20  mg  20 mg Oral Daily Leata Mouse, MD   20 mg at 12/15/14 0800  . lisinopril (PRINIVIL,ZESTRIL) tablet 10 mg  10 mg Oral Daily Sanjuana Kava, NP   10 mg at 12/15/14 0800  . magnesium hydroxide (MILK OF MAGNESIA) suspension 30 mL  30 mL Oral Daily PRN Charm Rings, NP      . nicotine (NICODERM CQ - dosed in mg/24 hours) patch 21 mg  21 mg Transdermal Daily Charm Rings, NP   21 mg at 12/15/14 0804  . ondansetron (ZOFRAN) tablet 4 mg  4 mg Oral Q8H PRN Charm Rings, NP      . QUEtiapine (SEROQUEL) tablet 25 mg  25 mg Oral BID Leata Mouse, MD   25 mg at 12/15/14 1642  . rivaroxaban (XARELTO) tablet 20 mg  20 mg Oral Q supper Sanjuana Kava, NP   20 mg at 12/15/14 1642  . traZODone (DESYREL) tablet 100 mg  100 mg Oral QHS PRN Worthy Flank, NP   100 mg at 12/12/14 2332    Lab Results: No results found for this or any previous visit (from the past 48 hour(s)).  Physical Findings: AIMS: Facial and Oral Movements Muscles of Facial Expression: None, normal Lips and Perioral Area: None, normal Jaw: None, normal Tongue: None, normal,Extremity Movements Upper (arms, wrists, hands, fingers): None, normal Lower (legs, knees, ankles, toes): None, normal, Trunk Movements Neck, shoulders, hips: None, normal, Overall Severity Severity of abnormal movements (highest score from questions above): None, normal Incapacitation due to abnormal movements: None, normal Patient's awareness of abnormal movements (rate only patient's report): No Awareness, Dental Status Current problems with teeth and/or dentures?: No Does patient usually wear dentures?: No  CIWA:  CIWA-Ar Total: 0 COWS:  COWS Total Score: 2  Musculoskeletal: Strength & Muscle Tone: within normal limits Gait & Station: normal Patient leans: normal  Psychiatric Specialty Exam: Review of Systems  Constitutional: Negative.   HENT: Negative.   Eyes: Negative.   Respiratory: Negative.   Cardiovascular: Negative.    Gastrointestinal: Negative.   Genitourinary: Negative.   Musculoskeletal: Negative.   Skin: Negative.   Neurological: Negative.   Endo/Heme/Allergies: Negative.   Psychiatric/Behavioral: Positive for depression and substance abuse. The patient is nervous/anxious.     Blood pressure 137/101, pulse 102, temperature 97.8 F (36.6 C), temperature source Oral, resp. rate 18, height  (1.854 m), weight 117.935 kg (260 lb), SpO2 97 %.Body mass index is 34.31 kg/(m^2).  General Appearance: Fairly Groomed  Patent attorney::  Fair  Speech:  Clear and Coherent  Volume:  Decreased  Mood:  Anxious and Depressed  Affect:  Restricted  Thought Process:  Coherent and Goal Directed  Orientation:  Full (Time, Place, and Person)  Thought Content:  symptoms events worries concerns  Suicidal Thoughts:  No  Homicidal Thoughts:  No  Memory:  Immediate;   Fair Recent;   Fair Remote;   Fair  Judgement:  Fair  Insight:  Present and Shallow  Psychomotor Activity:  Decreased  Concentration:  Fair  Recall:  [employer] of Knowledge:Fair  Language: Fair  Akathisia:  No  Handed:  Right  AIMS (if indicated):     Assets:  Desire for Improvement  ADL's:  Intact  Cognition: WNL  Sleep:  Number of Hours: 5.75   Treatment Plan Summary: Daily contact with patient to assess and evaluate symptoms and progress in treatment and Medication management Supportive approach/coping skills Cocaine dependence; monitor mood instability from coming off the cocaine/work a relapse prevention plan Depression; continue work with Prozac 20 mg daily Grogginess; will D/C the Seroquel  Will explore residential treatment programs LUGO,IRVING A 12/15/2014, 6:59 PM

## 2014-12-15 NOTE — Progress Notes (Signed)
Patient attended AA group meeting tonight.  

## 2014-12-15 NOTE — Progress Notes (Signed)
D: Pt presents irritable on approach. Pt reported that he's tired of staff waking him up and he wants to rest. Pt stated that he was woken up throughout the night d/t his roommate having c/o pain. Pt rates depression 6/10. Hopeless 5/10. Anxiety 5/10. Pt denies suicidal thoughts. Pt reports withdrawal symptoms of tremors, cravings, agitation, and irritability. Pt requesting long-term tx facility upon discharge. Pt b/p elevated this morning. Writer explained to pt that b/p will need to be reassessed in a hour. Pt refused, stating he wants to sleep. Writer informed pt that b/p will be reassessed at noon. A: Medications administered as ordered per MD. Verbal support given. Pt encouraged to attend groups. 15 minute checks performed for safety.  R: Pt safety maintained.

## 2014-12-15 NOTE — Progress Notes (Signed)
Recreation Therapy Notes  Date: 09.26.2016 Time: 9:30am Location: [address] Group Room   Group Topic: Stress Management  Goal Area(s) Addresses:  Patient will actively participate in stress management techniques presented during session.   Behavioral Response: Did not attend.   Denise L Blanchfield, LRT/CTRS        Blanchfield, Denise L 12/15/2014 1:15 PM 

## 2014-12-15 NOTE — Plan of Care (Signed)
Problem: Alteration in mood & ability to function due to Goal: STG-Patient will attend groups Outcome: Not Progressing Pt did not attend AA group

## 2014-12-15 NOTE — [facility] Group Notes (Signed)
[facility] LCSW Aftercare Discharge Planning Group Note   12/15/2014 10:46 AM  Participation Quality:  Invited- DID NOT ATTEND. Pt chose to remain in bed this morning.   Smart, [employer]

## 2014-12-15 NOTE — [facility] Group Notes (Signed)
[facility] LCSW Group Therapy  12/15/2014 12:18 PM  Type of Therapy:  Group Therapy  Participation Level:  Did Not Attend-invited. Chose to remain in bed.   Modes of Intervention:  Confrontation, Discussion, Education, Exploration, Problem-solving, Rapport Building, Socialization and Support  Summary of Progress/Problems: Today's Topic: Overcoming Obstacles. Patients identified one short term goal and potential obstacles in reaching this goal. Patients processed barriers involved in overcoming these obstacles. Patients identified steps necessary for overcoming these obstacles and explored motivation (internal and external) for facing these difficulties head on.   Smart, Heather LCSWA  12/15/2014, 12:18 PM

## 2014-12-15 NOTE — Tx Team (Signed)
Interdisciplinary Treatment Plan Update (Adult)  Date:  12/15/2014  Time Reviewed:  10:50 AM   Progress in Treatment: Attending groups: No. Participating in groups:  No. Taking medication as prescribed:  Yes. Tolerating medication:  Yes. Family/Significant othe contact made:  Not yet. SPE required for this pt.  Patient understands diagnosis:  Yes. and As evidenced by:  seeking treatment for SI, crack cocaine abuse, depression, SI Discussing patient identified problems/goals with staff:  Yes. Medical problems stabilized or resolved:  Yes. Denies suicidal/homicidal ideation: Yes. Issues/concerns per patient self-inventory:  Other:  New problem(s) identified:  Pt not currently attending discharge planning group.   Discharge Plan or Barriers: CSW assessing. Referral sent to [facility] on 9/26. [facility] for o/p services.   Reason for Continuation of Hospitalization: Depression Medication stabilization Withdrawal symptoms  Comments:  [name] is an 39 y.o. Male admitted for increased symptoms of depression and attempted to kill himself by overdosing on crack. He had been clean for 3 months after going through [facility] in Feb 2016. Patient relapsed over the last few weeks and has been using crack daily for the last week. Wife is threatening to leave him and not sure he can go back to home. She has three step-daughters and he is upset at the prospect of losing her and not seeing grandchildren. Patient still feels like he wants to kill himself but denied current plan. Patient has been thinking about it off and on over the past 6 months. patient stated that he went to a friend's home and used 3-4 grams of cocaine in an effort to kill himself. Patient says he tried to go to a local store to use a phone but he collapsed on the ground on the way.Patient has had one previous suicide attempt. He says at times he wants to harm others but has no current plan or intention. Patient denies  any A/V hallucinations. Patient has lost his jobs and is about to lose his marriage because of his drug use. Patient uses ETOH and has used some tonight. Patient has gone to NA meetings off and on during the past few months. 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 He has not taken his xarelto in the last 8 months so a doppler has been ordered for this AM (09/23). His BAL is less than 5 and UDS is positive for cocaine.   Estimated length of stay:  3-5 days   New goal(s): to develop aftercare plan.   Additional Comments:  Patient and CSW reviewed pt's identified goals and treatment plan. Patient verbalized understanding and agreed to treatment plan. CSW reviewed [facility] "Discharge Process and Patient Involvement" Form. Pt verbalized understanding of information provided and signed form.    Review of initial/current patient goals per problem list:  1. Goal(s): Patient will participate in aftercare plan  Met: No.   Target date: at discharge  As evidenced by: Patient will participate within aftercare plan AEB aftercare provider and housing plan at discharge being identified.  9/26: Referral to [facility] faxed this morning. [facility] for outpatient services. Pt not attending discharge planning group at this time.   2. Goal (s): Patient will exhibit decreased depressive symptoms and suicidal ideations.  Met: No.    Target date: at discharge  As evidenced by: Patient will utilize self rating of depression at 3 or below and demonstrate decreased signs of depression or be deemed stable for discharge by MD.  9/26: Pt  rates depression as high and presents with depressed mood and flat affect. Denies Si/Hi/AVh.   3. Goal(s): Patient will demonstrate decreased signs of withdrawal due to substance abuse  Met:No.   Target date:at discharge   As evidenced by: Patient will produce a CIWA/COWS score of 0, have stable vitals signs, and  no symptoms of withdrawal.  9/26: Pt reports moderate withdrawal symptoms with COWS of 2 and high blood pressure.    Attendees: Patient:   12/15/2014 10:50 AM   Family:   12/15/2014 10:50 AM   Physician:  Dr. Carlton Adam, MD 12/15/2014 10:50 AM   Nursing:   Gari Crown RN 12/15/2014 10:50 AM   Clinical Social Worker: Maxie Better, Maud  12/15/2014 10:50 AM   Clinical Social Worker:  Peri Maris LCSWA 12/15/2014 10:50 AM   Other:  Gerline Legacy Nurse Case Manager 12/15/2014 10:50 AM   Other:  Lucinda Dell; [facility]  12/15/2014 10:50 AM   Other:   12/15/2014 10:50 AM   Other:  12/15/2014 10:50 AM   Other:  12/15/2014 10:50 AM   Other:  12/15/2014 10:50 AM    12/15/2014 10:50 AM    12/15/2014 10:50 AM    12/15/2014 10:50 AM    12/15/2014 10:50 AM    Scribe for Treatment Team:   Maxie Better, LCSWA  12/15/2014 10:50 AM

## 2014-12-16 NOTE — Progress Notes (Signed)
D: Pt presents with flat affect and depressed mood. Pt reports decreased anxiety and depression today. Pt denies withdrawal symptoms. Pt isolates in his room and appears withdrawn. No engagement on the unit observed. Pt compliant with taking meds. No adverse reaction to meds verbalized by pt. Pt non-compliant with attending groups.  A: Medications administered as ordered per MD. Verbal support given. Pt encouraged to attend groups. 15 minute checks performed for safety.  R: Pt receptive to tx. Pt verbalized understanding of SI contract.

## 2014-12-16 NOTE — Progress Notes (Signed)
D: Pt denies SI/HI/AVH. Pt is pleasant and cooperative. Pt really worried about his future with his wife and job at [employer]. Pt wants a 60 day Tx program when he leaves.   A: Pt was offered support and encouragement. Pt was given scheduled medications. Pt was encourage to attend groups. Q 15 minute checks were done for safety.   R:Pt attends groups and interacts well with peers and staff. Pt is taking medication. Pt has no complaints at this time.Pt receptive to treatment and safety maintained on unit.

## 2014-12-16 NOTE — Progress Notes (Signed)
Pt attended the evening AA speaker meeting. 

## 2014-12-16 NOTE — Plan of Care (Signed)
Problem: Alteration in mood & ability to function due to Goal: LTG-Pt reports reduction in suicidal thoughts (Patient reports reduction in suicidal thoughts and is able to verbalize a safety plan for whenever patient is feeling suicidal)  Outcome: Progressing Denies SI at this time   Problem: Alteration in mood Goal: LTG-Pt's behavior demonstrates decreased signs of depression (Patient's behavior demonstrates decreased signs of depression to the point the patient is safe to return home and continue treatment in an outpatient setting)  Outcome: Progressing Pt observed interacting with wife and appeared happy when with her.

## 2014-12-16 NOTE — Progress Notes (Signed)
[facility] MD Progress Note  12/16/2014 7:13 PM [name]  MRN:  [id number] Subjective:  [name] endorses that he still does not know if he is going to have his job back. Admits he is ashamed to ask the pastor for it. He thinks that he is going to be able to work things out with his wife but he does not know for sure. Admits to a lot of uncertainty that creates anxiety Principal Problem: Cocaine-induced mood disorder Diagnosis:   Patient Active Problem List   Diagnosis Date Noted  . Cocaine abuse [F14.10] 12/12/2014  . 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 Chest pain [R07.9] 01/23/2012  . Syncope [R55] 01/16/2012  . DVT (deep venous thrombosis) [I82.409] 01/16/2012  . Long term (current) use of anticoagulants [Z79.01] 08/16/2011  . TOBACCO USER [Z72.0] 12/27/2008  . NONDEPENDENT COCAINE ABUSE EPISODIC [F14.10] 10/01/2008  . 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 PE (pulmonary embolism) 2011; 07/2011  . Syncope and collapse 01/16/2012    "woke up to dog licking my face" (01/16/2012)  . 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 Cocaine abuse   . Family history of early CAD     Father deceased at 50 yo with MI  . Tobacco abuse     Past Surgical History  Procedure Laterality Date  . No past surgeries     Family History:  Family History  Problem Relation Age of Onset  . Coronary artery disease Father 57    MI at age 23  . Colon cancer Paternal Grandmother   . Cancer Maternal Grandfather    Family Psychiatric  History: see Admission H and PE Social History:  History  Alcohol Use  . Yes    Comment: occasion     History  Drug Use  . Yes  . Special: Cocaine, Marijuana    Social History   Social History  . Marital Status: Single    Spouse Name: N/A  . Number of Children: 0  . Years  of Education: college   Occupational History  . Musician     plays piano   Social History Main Topics  . Smoking status: Current Every Day Smoker -- 0.50 packs/day for 5 years    Types: Cigarettes  . Smokeless tobacco: Never Used  . Alcohol Use: Yes     Comment: occasion  . Drug Use: Yes    Special: Cocaine, Marijuana  . Sexual Activity: Yes   Other Topics Concern  . None   Social History Narrative   Lives in [place] by himself.   Additional Social History:    Pain Medications: none Prescriptions: xarelto Over the Counter: none History of alcohol / drug use?: Yes Longest period of sobriety (when/how long): 2 months Negative Consequences of Use: Financial, Armed forces operational officer, Personal relationships, Work / School Withdrawal Symptoms: Agitation, Fever / Chills, Change in blood pressure Name of Substance 1: ETOH 1 - Age of First Use: 16 yoa 1 - Amount (size/oz): 12 beers 1 - Frequency: 3-4 times a week 1 - Duration: on-going 1 - Last Use / Amount: 09/22 Name of Substance 2: Marijuana 2 - Age of First Use: [age] 2 - Amount (size/oz): a blunt at a time 2 - Frequency: once per month 2 - Duration: on-going 2 - Last Use / Amount: Last  week Name of Substance 3: Crack cocaine 3 - Age of First Use: 39 years of age 76 - Amount (size/oz): 3-7 grams in a day 3 - Frequency: Daily 3 - Duration: For the last week at this rate. 3 - Last Use / Amount: 09/22              Sleep: Fair  Appetite:  Fair  Current Medications: Current Facility-Administered Medications  Medication Dose Route Frequency Rick Warnick Last Rate Last Dose  . acetaminophen (TYLENOL) tablet 650 mg  650 mg Oral Q6H PRN Charm Rings, NP      . alum & mag hydroxide-simeth (MAALOX/MYLANTA) 200-200-20 MG/5ML suspension 30 mL  30 mL Oral Q4H PRN Charm Rings, NP      . FLUoxetine (PROZAC) capsule 20 mg  20 mg Oral Daily Leata Mouse, MD   20 mg at 12/16/14 0807  . lisinopril (PRINIVIL,ZESTRIL) tablet 10  mg  10 mg Oral Daily Sanjuana Kava, NP   10 mg at 12/16/14 0807  . magnesium hydroxide (MILK OF MAGNESIA) suspension 30 mL  30 mL Oral Daily PRN Charm Rings, NP      . nicotine (NICODERM CQ - dosed in mg/24 hours) patch 21 mg  21 mg Transdermal Daily Charm Rings, NP   21 mg at 12/16/14 0807  . ondansetron (ZOFRAN) tablet 4 mg  4 mg Oral Q8H PRN Charm Rings, NP      . rivaroxaban Carlena Hurl) tablet 20 mg  20 mg Oral Q supper Sanjuana Kava, NP   20 mg at 12/16/14 1643  . traZODone (DESYREL) tablet 100 mg  100 mg Oral QHS PRN Worthy Flank, NP   100 mg at 12/15/14 2157    Lab Results: No results found for this or any previous visit (from the past 48 hour(s)).  Physical Findings: AIMS: Facial and Oral Movements Muscles of Facial Expression: None, normal Lips and Perioral Area: None, normal Jaw: None, normal Tongue: None, normal,Extremity Movements Upper (arms, wrists, hands, fingers): None, normal Lower (legs, knees, ankles, toes): None, normal, Trunk Movements Neck, shoulders, hips: None, normal, Overall Severity Severity of abnormal movements (highest score from questions above): None, normal Incapacitation due to abnormal movements: None, normal Patient's awareness of abnormal movements (rate only patient's report): No Awareness, Dental Status Current problems with teeth and/or dentures?: No Does patient usually wear dentures?: No  CIWA:  CIWA-Ar Total: 0 COWS:  COWS Total Score: 2  Musculoskeletal: Strength & Muscle Tone: within normal limits Gait & Station: normal Patient leans: normal  Psychiatric Specialty Exam: Review of Systems  Constitutional: Negative.   HENT: Negative.   Eyes: Negative.   Respiratory: Negative.   Cardiovascular: Negative.   Gastrointestinal: Negative.   Genitourinary: Negative.   Musculoskeletal: Negative.   Skin: Negative.   Neurological: Negative.   Endo/Heme/Allergies: Negative.   Psychiatric/Behavioral: Positive for depression and  substance abuse. The patient is nervous/anxious.     Blood pressure 135/82, pulse 73, temperature 98.2 F (36.8 C), temperature source Oral, resp. rate 18, height  (1.854 m), weight 117.935 kg (260 lb), SpO2 97 %.Body mass index is 34.31 kg/(m^2).  General Appearance: Fairly Groomed  Patent attorney::  Fair  Speech:  Clear and Coherent  Volume:  Decreased  Mood:  Anxious, Depressed and worried  Affect:  anxious worried  Thought Process:  Coherent and Goal Directed  Orientation:  Full (Time, Place, and Person)  Thought Content:  worries concerns  Suicidal Thoughts:  No  Homicidal Thoughts:  No  Memory:  Immediate;   Fair Recent;   Fair Remote;   Fair  Judgement:  Fair  Insight:  Present  Psychomotor Activity:  Decreased  Concentration:  Fair  Recall:  [employer] of Knowledge:Fair  Language: Fair  Akathisia:  No  Handed:  Right  AIMS (if indicated):     Assets:  Desire for Improvement  ADL's:  Intact  Cognition: WNL  Sleep:  Number of Hours: 6.75   Treatment Plan Summary: Daily contact with patient to assess and evaluate symptoms and progress in treatment and Medication management Supportive approach/coping skills Cocaine abuse; continue to work a relapse prevention plan Depression; continue the Prozac 20 mg daily Insomnia; continue the Trazodone 100 mg HS  Explore residential treatment options LUGO,IRVING A 12/16/2014, 7:13 PM

## 2014-12-16 NOTE — Progress Notes (Signed)
D: Pt denies SI/HI/AVH. Pt is pleasant and cooperative. Pt anxious due to the uncertainties in his future with his situations. Pt observed interacting on unit with peers in the dayroom minimally .    A: Pt was offered support and encouragement. Pt was given scheduled medications. Pt was encourage to attend groups. Q 15 minute checks were done for safety.   R: Pt is taking medication. Pt has no complaints at this time .Pt receptive to treatment and safety maintained on unit.

## 2014-12-16 NOTE — Progress Notes (Signed)
[facility] Group Notes:  (Nursing/MHT/Case Management/Adjunct)  Date:  12/16/2014  Time:  9:51 AM  Type of Therapy:  Psychoeducational Skills  Participation Level:  None  Participation Quality:  Appropriate  Affect:  Appropriate  Cognitive:  Alert, Appropriate and Oriented  Insight:  Improving and Lacking  Engagement in Group:  None and Poor  Modes of Intervention:  Education and Support  Summary of Progress/Problems: Patient attended group but did not participate.   Cruz Condon 12/16/2014, 9:51 AM

## 2014-12-16 NOTE — Plan of Care (Signed)
Problem: Ineffective individual coping Goal: LTG: Patient will report a decrease in negative feelings Outcome: Progressing Pt stated he was feeling a little better  Problem: Alteration in mood & ability to function due to Goal: LTG-Pt reports reduction in suicidal thoughts (Patient reports reduction in suicidal thoughts and is able to verbalize a safety plan for whenever patient is feeling suicidal)  Outcome: Progressing Pt denies SI at this time

## 2014-12-16 NOTE — [facility] Group Notes (Signed)
[facility] LCSW Group Therapy  12/16/2014 3:37 PM  Type of Therapy:  Group Therapy  Participation Level:  Active  Participation Quality:  Attentive  Affect:  Appropriate  Cognitive:  Oriented  Insight:  Engaged  Engagement in Therapy:  Improving  Modes of Intervention:  Discussion, Education, Exploration, Problem-solving, Rapport Building, Socialization and Support  Summary of Progress/Problems: [facility] Speaker came to talk about his personal journey with substance abuse and addiction. The pt processed ways by which to relate to the speaker. [facility] speaker provided handouts and educational information pertaining to groups and services offered by the [facility].   Smart, Kahmari Herard LCSWA  12/16/2014, 3:37 PM

## 2014-12-17 NOTE — [facility] Group Notes (Signed)
[facility] LCSW Group Therapy  12/17/2014 12:47 PM  Type of Therapy:  Group Therapy  Participation Level:  Did Not Attend-pt chose to remain in room.   Modes of Intervention:  Confrontation, Discussion, Education, Exploration, Problem-solving, Rapport Building, Socialization and Support  Summary of Progress/Problems: Today's Topic: Overcoming Obstacles. Patients identified one short term goal and potential obstacles in reaching this goal. Patients processed barriers involved in overcoming these obstacles. Patients identified steps necessary for overcoming these obstacles and explored motivation (internal and external) for facing these difficulties head on.   Smart, Heather LCSWA 12/17/2014, 12:47 PM

## 2014-12-17 NOTE — [facility] Group Notes (Signed)
[facility] LCSW Aftercare Discharge Planning Group Note   12/17/2014 10:16 AM  Participation Quality:  Minimal   Mood/Affect:  Depressed and Flat  Depression Rating:  0  Anxiety Rating:  0  Thoughts of Suicide:  No Will you contract for safety?   NA  Current AVH:  No  Plan for Discharge/Comments:  Pt reports that he is feeling okay today. "I couldn't sleep." Pt reports no other needs and is aware of [facility] screening on Wed of next week Oct 5th. Pt wants CSW to speak with his wife about possibly letting him come home until his screening. CSW assessing.   Transportation Means: wife?  Supports: wife  Counselling psychologist, Herbert Seta

## 2014-12-17 NOTE — Progress Notes (Signed)
Pt attended NA group this evening.  

## 2014-12-17 NOTE — Progress Notes (Signed)
[facility] MD Progress Note  12/17/2014 6:18 PM JACCOB CZAPLICKI  MRN:  [id number] Subjective:  Oscar states he does not know what he is going to do. He has not heard if he still has his job, and he has not heard if his wife is going to allow him to stay with her while he waits for a bed at [facility]. He states he recognizes that his wife is angry with him and does not know if she will be willing to give him another chance the same with his employer. He continues to isolate be reserved and to himself Principal Problem: Cocaine-induced mood disorder Diagnosis:   Patient Active Problem List   Diagnosis Date Noted  . Cocaine abuse [F14.10] 12/12/2014  . 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 Chest pain [R07.9] 01/23/2012  . Syncope [R55] 01/16/2012  . DVT (deep venous thrombosis) [I82.409] 01/16/2012  . Long term (current) use of anticoagulants [Z79.01] 08/16/2011  . TOBACCO USER [Z72.0] 12/27/2008  . NONDEPENDENT COCAINE ABUSE EPISODIC [F14.10] 10/01/2008  . 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 PE (pulmonary embolism) 2011; 07/2011  . Syncope and collapse 01/16/2012    "woke up to dog licking my face" (01/16/2012)  . 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 Cocaine abuse   . Family history of early CAD     Father deceased at 37 yo with MI  . Tobacco abuse     Past Surgical History  Procedure Laterality Date  . No past surgeries     Family History:  Family History  Problem Relation Age of Onset  . Coronary artery disease Father 94    MI at age 37  . Colon cancer Paternal Grandmother   . Cancer Maternal Grandfather    Family Psychiatric  History: see Admission H and PE Social History:  History  Alcohol Use  . Yes    Comment: occasion     History  Drug Use  . Yes  . Special: Cocaine,  Marijuana    Social History   Social History  . Marital Status: Single    Spouse Name: N/A  . Number of Children: 0  . Years of Education: college   Occupational History  . Musician     plays piano   Social History Main Topics  . Smoking status: Current Every Day Smoker -- 0.50 packs/day for 5 years    Types: Cigarettes  . Smokeless tobacco: Never Used  . Alcohol Use: Yes     Comment: occasion  . Drug Use: Yes    Special: Cocaine, Marijuana  . Sexual Activity: Yes   Other Topics Concern  . None   Social History Narrative   Lives in [place] by himself.   Additional Social History:    Pain Medications: none Prescriptions: xarelto Over the Counter: none History of alcohol / drug use?: Yes Longest period of sobriety (when/how long): 2 months Negative Consequences of Use: Financial, Armed forces operational officer, Personal relationships, Work / School Withdrawal Symptoms: Agitation, Fever / Chills, Change in blood pressure Name of Substance 1: ETOH 1 - Age of First Use: 16 yoa 1 - Amount (size/oz): 12 beers 1 - Frequency: 3-4 times a week 1 - Duration: on-going 1 - Last Use / Amount: 09/22 Name of Substance 2: Marijuana 2 - Age of First Use:  [age] 2 - Amount (size/oz): a blunt at a time 2 - Frequency: once per month 2 - Duration: on-going 2 - Last Use / Amount: Last week Name of Substance 3: Crack cocaine 3 - Age of First Use: 39 years of age 37 - Amount (size/oz): 3-7 grams in a day 3 - Frequency: Daily 3 - Duration: For the last week at this rate. 3 - Last Use / Amount: 09/22              Sleep: Fair  Appetite:  Fair  Current Medications: Current Facility-Administered Medications  Medication Dose Route Frequency Provider Last Rate Last Dose  . acetaminophen (TYLENOL) tablet 650 mg  650 mg Oral Q6H PRN Charm Rings, NP      . alum & mag hydroxide-simeth (MAALOX/MYLANTA) 200-200-20 MG/5ML suspension 30 mL  30 mL Oral Q4H PRN Charm Rings, NP      . FLUoxetine  (PROZAC) capsule 20 mg  20 mg Oral Daily Leata Mouse, MD   20 mg at 12/17/14 0801  . lisinopril (PRINIVIL,ZESTRIL) tablet 10 mg  10 mg Oral Daily Sanjuana Kava, NP   10 mg at 12/17/14 0801  . magnesium hydroxide (MILK OF MAGNESIA) suspension 30 mL  30 mL Oral Daily PRN Charm Rings, NP      . nicotine (NICODERM CQ - dosed in mg/24 hours) patch 21 mg  21 mg Transdermal Daily Charm Rings, NP   21 mg at 12/17/14 0802  . ondansetron (ZOFRAN) tablet 4 mg  4 mg Oral Q8H PRN Charm Rings, NP      . rivaroxaban Carlena Hurl) tablet 20 mg  20 mg Oral Q supper Sanjuana Kava, NP   20 mg at 12/17/14 1718  . traZODone (DESYREL) tablet 100 mg  100 mg Oral QHS PRN Worthy Flank, NP   100 mg at 12/16/14 2234    Lab Results: No results found for this or any previous visit (from the past 48 hour(s)).  Physical Findings: AIMS: Facial and Oral Movements Muscles of Facial Expression: None, normal Lips and Perioral Area: None, normal Jaw: None, normal Tongue: None, normal,Extremity Movements Upper (arms, wrists, hands, fingers): None, normal Lower (legs, knees, ankles, toes): None, normal, Trunk Movements Neck, shoulders, hips: None, normal, Overall Severity Severity of abnormal movements (highest score from questions above): None, normal Incapacitation due to abnormal movements: None, normal Patient's awareness of abnormal movements (rate only patient's report): No Awareness, Dental Status Current problems with teeth and/or dentures?: No Does patient usually wear dentures?: No  CIWA:  CIWA-Ar Total: 0 COWS:  COWS Total Score: 2  Musculoskeletal: Strength & Muscle Tone: within normal limits Gait & Station: normal Patient leans: normal  Psychiatric Specialty Exam: ROS  Blood pressure 140/83, pulse 95, temperature 97.6 F (36.4 C), temperature source Oral, resp. rate 18, height  (1.854 m), weight 117.935 kg (260 lb), SpO2 97 %.Body mass index is 34.31 kg/(m^2).  General Appearance:  Fairly Groomed  Patent attorney::  Fair  Speech:  Clear and Coherent  Volume:  Decreased  Mood:  euthymic  Affect:  Restricted  Thought Process:  Coherent and Goal Directed  Orientation:  Full (Time, Place, and Person)  Thought Content:  symptoms events worries concerns  Suicidal Thoughts:  No  Homicidal Thoughts:  No  Memory:  Immediate;   Fair Recent;   Fair Remote;   Fair  Judgement:  Fair  Insight:  Present and Shallow  Psychomotor Activity:  Decreased  Concentration:  Fair  Recall:  Jennelle Human of Knowledge:Fair  Language: Fair  Akathisia:  No  Handed:  Right  AIMS (if indicated):     Assets:  Desire for Improvement  ADL's:  Intact  Cognition: WNL  Sleep:  Number of Hours: 6.25   Treatment Plan Summary: Daily contact with patient to assess and evaluate symptoms and progress in treatment and Medication management Supportive approach/coping skills Depression; continue the Prozac 20 mg daily Cocaine abuse; work a relapse prevention plan CBT/mindfulness Explore residential treatment options  LUGO,IRVING A 12/17/2014, 6:18 PM

## 2014-12-17 NOTE — Progress Notes (Signed)
D: Pt denies SI/HI/AVH. Pt is pleasant and cooperative. Pt has bright affect verbal and visible on the unit. Pt stated he is ready to leave, but is a little anxious, due to wanting to go straight to LT Tx.   A: Pt was offered support and encouragement. Pt was given scheduled medications. Pt was encourage to attend groups. Q 15 minute checks were done for safety.   R:Pt attends groups and interacts well with peers and staff. Pt is taking medication. Pt has no complaints at this time .Pt receptive to treatment and safety maintained on unit.

## 2014-12-17 NOTE — [facility] Suicide Risk Assessment (Signed)
[facility] INPATIENT:  Family/Significant Other Suicide Prevention Education  Suicide Prevention Education:  Education Completed; Ricki Clack (pt's wife) [phone] has been identified by the patient as the family member/significant other with whom the patient will be residing, and identified as the person(s) who will aid the patient in the event of a mental health crisis (suicidal ideations/suicide attempt).  With written consent from the patient, the family member/significant other has been provided the following suicide prevention education, prior to the and/or following the discharge of the patient.  The suicide prevention education provided includes the following:  Suicide risk factors  Suicide prevention and interventions  National Suicide Hotline telephone number  [facility] assessment telephone number  [facility] Emergency Assistance 911  [facility] and/or Residential Mobile Crisis Unit telephone number  Request made of family/significant other to:  Remove weapons (e.g., guns, rifles, knives), all items previously/currently identified as safety concern.    Remove drugs/medications (over-the-counter, prescriptions, illicit drugs), all items previously/currently identified as a safety concern.  The family member/significant other verbalizes understanding of the suicide prevention education information provided.  The family member/significant other agrees to remove the items of safety concern listed above.  Smart, Heather LCSWA 12/17/2014, 3:01 PM

## 2014-12-17 NOTE — Plan of Care (Signed)
Problem: Alteration in mood & ability to function due to Goal: LTG-Pt reports reduction in suicidal thoughts (Patient reports reduction in suicidal thoughts and is able to verbalize a safety plan for whenever patient is feeling suicidal)  Outcome: Progressing Denies SI at this time  Problem: Alteration in mood Goal: LTG-Pt's behavior demonstrates decreased signs of depression (Patient's behavior demonstrates decreased signs of depression to the point the patient is safe to return home and continue treatment in an outpatient setting)  Outcome: Progressing Pt up and interacting with peers and staff and appears not to be depressed

## 2014-12-17 NOTE — Progress Notes (Signed)
D: Pt presents with flat affect and depressed mood. Pt isolates in his room and appears withdrawn. Pt have minimal interaction on the unit. Pt seeking long term at [facility]. Pt compliant with taking meds. No adverse reaction to meds verbalized by pt. Pt reports difficulty sleeping last night, up and down throughout the night. Pt reports fair appetite and sleep.  A: Medications administered as ordered per MD. Verbal support given. Pt encouraged to attend groups. 15 minute checks performed for safety. R: Pt safety maintained. Pt stated goal, "try to come up with a discharge plan".

## 2014-12-18 MED ORDER — LISINOPRIL 10 MG PO TABS: 10.0000 mg | ORAL_TABLET | Freq: Every day | ORAL | Status: DC

## 2014-12-18 MED ORDER — NICOTINE 21 MG/24HR TD PT24: 21.0000 mg | MEDICATED_PATCH | Freq: Every day | TRANSDERMAL | Status: DC

## 2014-12-18 MED ORDER — XARELTO VTE STARTER PACK 15 & 20 MG PO TBPK: 15.0000 mg | ORAL_TABLET | ORAL | Status: DC

## 2014-12-18 MED ORDER — FLUOXETINE HCL 20 MG PO CAPS: 20.0000 mg | ORAL_CAPSULE | Freq: Every day | ORAL | Status: DC

## 2014-12-18 MED ORDER — TRAZODONE HCL 100 MG PO TABS: 100.0000 mg | ORAL_TABLET | Freq: Every evening | ORAL | Status: DC | PRN

## 2014-12-18 NOTE — Progress Notes (Signed)
Patient observed on phone during lunch time, arguing, angry, presumably with wife. States she will come pick him up even though she had previously indicated to CM she would not. Patient agitated, hostile and in verbal altercation with roommate. Threatening to harm roommate. Decision made to discharge patient to lobby given his unpredictable behavior. D/C plan was explained, Rx's and medication samples provided. All belongings returned. Patient denies SI/HI and verbalizes understanding of plan. Discharged ambulatory, in stable condition to lobby. This Clinical research associate followed up with reception and patient was picked up by wife. Lawrence Marseilles

## 2014-12-18 NOTE — Tx Team (Signed)
Interdisciplinary Treatment Plan Update (Adult)  Date:  12/18/2014  Time Reviewed:  10:50 AM   Progress in Treatment: Attending groups: No. Participating in groups:  No. Taking medication as prescribed:  Yes. Tolerating medication:  Yes. Family/Significant othe contact made:  SPE completed with pt's wife.  Patient understands diagnosis:  Yes. and As evidenced by:  seeking treatment for SI, crack cocaine abuse, depression, SI Discussing patient identified problems/goals with staff:  Yes. Medical problems stabilized or resolved:  Yes. Denies suicidal/homicidal ideation: Yes. Issues/concerns per patient self-inventory:  Other:  New problem(s) identified:  Pt has not been attending d/c planning group and is minimally participating in aftercare planning. Pt agitated easily and remains isolative in room.   Discharge Plan or Barriers: Admission for [facility] scheduled for Monday at 8am. Pt's wife and family will not allow him home in the meantime. Pt stated that "I'm not worried about it. I'll figure out something." Pt became agitated when pressed to discuss where he can go at discharge. Pt will possibly call his pastor for assistance and has been given IRC/shelter info but stated that he would work something out and will have someone pick him up from the hospital.   Reason for Continuation of Hospitalization: None  Comments:  [name] is an 39 y.o. Male admitted for increased symptoms of depression and attempted to kill himself by overdosing on crack. He had been clean for 3 months after going through [facility] in Feb 2016. Patient relapsed over the last few weeks and has been using crack daily for the last week. Wife is threatening to leave him and not sure he can go back to home. She has three step-daughters and he is upset at the prospect of losing her and not seeing grandchildren. Patient still feels like he wants to kill himself but denied current plan. Patient has been  thinking about it off and on over the past 6 months. patient stated that he went to a friend's home and used 3-4 grams of cocaine in an effort to kill himself. Patient says he tried to go to a local store to use a phone but he collapsed on the ground on the way.Patient has had one previous suicide attempt. He says at times he wants to harm others but has no current plan or intention. Patient denies any A/V hallucinations. Patient has lost his jobs and is about to lose his marriage because of his drug use. Patient uses ETOH and has used some tonight. Patient has gone to NA meetings off and on during the past few months. 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 He has not taken his xarelto in the last 8 months so a doppler has been ordered for this AM (09/23). His BAL is less than 5 and UDS is positive for cocaine.   Estimated length of stay:  D/c today   Additional Comments:  Patient and CSW reviewed pt's identified goals and treatment plan. Patient verbalized understanding and agreed to treatment plan. CSW reviewed [facility] "Discharge Process and Patient Involvement" Form. Pt verbalized understanding of information provided and signed form.    Review of initial/current patient goals per problem list:  1. Goal(s): Patient will participate in aftercare plan  Met: Yes  Target date: at discharge  As evidenced by: Patient will participate within aftercare plan AEB aftercare provider and housing plan at discharge being identified.  9/26: Referral to [facility] faxed this morning. [facility] for outpatient  services. Pt not attending discharge planning group at this time.   9/29: Pt has admission to [facility] scheduled for Monday. [facility] for outpatient services. Given IRC/shelter info and Mental health association information.   2. Goal (s): Patient will exhibit decreased depressive symptoms and suicidal ideations.  Met: Yes     Target date: at discharge  As evidenced by: Patient will utilize self rating of depression at 3 or below and demonstrate decreased signs of depression or be deemed stable for discharge by MD.  9/26: Pt rates depression as high and presents with depressed mood and flat affect. Denies Si/Hi/AVh.   9/29: Pt rates depression as improving. Denies SI/HI/AVH. Agitated this morning. Per MD, pt is mentally stable for discharge.   3. Goal(s): Patient will demonstrate decreased signs of withdrawal due to substance abuse  Met:Yes   Target date:at discharge   As evidenced by: Patient will produce a CIWA/COWS score of 0, have stable vitals signs, and no symptoms of withdrawal.  9/26: Pt reports moderate withdrawal symptoms with COWS of 2 and high blood pressure.   9/29: Pt denies withdrawal symptoms today. No COWS score/CIWA of 0 and stable vitals.    Attendees: Patient:   12/18/2014 10:50 AM   Family:   12/18/2014 10:50 AM   Physician:  Dr. Carlton Adam, MD 12/18/2014 10:50 AM   Nursing: Shaune Pollack; Opal Sidles RN 12/18/2014 10:50 AM   Clinical Social Worker: Maxie Better, [place]  12/18/2014 10:50 AM   Clinical Social Worker:  Peri Maris LCSWA 12/18/2014 10:50 AM   Other:  Gerline Legacy Nurse Case Manager 12/18/2014 10:50 AM   Other:  Lucinda Dell; [facility]  12/18/2014 10:50 AM   Other:   12/18/2014 10:50 AM   Other:  12/18/2014 10:50 AM   Other:  12/18/2014 10:50 AM   Other:  12/18/2014 10:50 AM    12/18/2014 10:50 AM    12/18/2014 10:50 AM    12/18/2014 10:50 AM    12/18/2014 10:50 AM    Scribe for Treatment Team:   Maxie Better, LCSWA  12/18/2014 10:50 AM

## 2014-12-18 NOTE — Discharge Summary (Signed)
Physician Discharge Summary Note  Patient:  [name] is an 39 y.o., male MRN:  [id number] DOB:  July 04, 1975 Patient phone:  [phone] (home)  Patient address:   [place] [address],  Total Time spent with patient: Greater than 30 minutes  Date of Admission:  12/12/2014  Date of Discharge: 12-18-14  Reason for Admission: Worsening symptoms of depression/cocaine abuse  Principal Problem: Cocaine-induced mood disorder  Discharge Diagnoses: Patient Active Problem List   Diagnosis Date Noted  . Cocaine abuse [F14.10] 12/12/2014  . 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 Chest pain [R07.9] 01/23/2012  . Syncope [R55] 01/16/2012  . DVT (deep venous thrombosis) [I82.409] 01/16/2012  . Long term (current) use of anticoagulants [Z79.01] 08/16/2011  . TOBACCO USER [Z72.0] 12/27/2008  . NONDEPENDENT COCAINE ABUSE EPISODIC [F14.10] 10/01/2008  . PULMONARY EMBOLISM [I26.99] 08/26/2008   Musculoskeletal: Strength & Muscle Tone: within normal limits Gait & Station: normal Patient leans: N/A  Psychiatric Specialty Exam: Physical Exam  Psychiatric: His speech is normal and behavior is normal. Judgment and thought content normal. His mood appears not anxious. His affect is not angry, not blunt, not labile and not inappropriate. Cognition and memory are normal. He does not exhibit a depressed mood.    Review of Systems  Constitutional: Negative.   HENT: Negative.   Eyes: Negative.   Respiratory: Negative.   Cardiovascular: Negative.   Gastrointestinal: Negative.   Genitourinary: Negative.   Musculoskeletal: Negative.   Skin: Negative.   Neurological: Negative.   Endo/Heme/Allergies: Negative.   Psychiatric/Behavioral: Positive for depression (Stable) and substance abuse (Stable). Negative for suicidal ideas, hallucinations and memory loss. The patient has insomnia (Stable).     Blood pressure 129/80, pulse 98, temperature 97.7 F  (36.5 C), temperature source Oral, resp. rate 16, height '6\' 1"'  (1.854 m), weight 117.935 kg (260 lb), SpO2 97 %.Body mass index is 34.31 kg/(m^2).  See Md's SRA  Have you used any form of tobacco in the last 30 days? (Cigarettes, Smokeless Tobacco, Cigars, and/or Pipes): Yes  Has this patient used any form of tobacco in the last 30 days? (Cigarettes, Smokeless Tobacco, Cigars, and/or Pipes) Yes, A prescription for an FDA-approved tobacco cessation medication was offered at discharge and the patient refused  Past Medical History:  Past Medical History  Diagnosis Date  . PE (pulmonary embolism) 2011; 07/2011  . Syncope and collapse 01/16/2012    "woke up to dog licking my face" (02/40/9735)  . 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 Cocaine abuse   . Family history of early CAD     Father deceased at 48 yo with MI  . Tobacco abuse     Past Surgical History  Procedure Laterality Date  . No past surgeries     Family History:  Family History  Problem Relation Age of Onset  . Coronary artery disease Father 53    MI at age 26  . Colon cancer Paternal Grandmother   . Cancer Maternal Grandfather    Social History:  History  Alcohol Use  . Yes    Comment: occasion     History  Drug Use  . Yes  . Special: Cocaine, Marijuana    Social History   Social History  . Marital Status: Single    Spouse Name: N/A  . Number of Children: 0  . Years of Education: college   Occupational History  . Musician     plays  piano   Social History Main Topics  . Smoking status: Current Every Day Smoker -- 0.50 packs/day for 5 years    Types: Cigarettes  . Smokeless tobacco: Never Used  . Alcohol Use: Yes     Comment: occasion  . Drug Use: Yes    Special: Cocaine, Marijuana  . Sexual Activity: Yes   Other Topics Concern  . None   Social History Narrative   Lives in [place] by himself.   Risk to Self: Is patient at risk for suicide?: Yes What has been your  use of drugs/alcohol within the last 12 months?: Pt smoking 1-2 grams crack cocaine on daily basis; pint of gin or 12 pack of beer daily and THC 2-3 times a week Risk to Others: No Prior Inpatient Therapy: Yes Prior Outpatient Therapy: Yes  Level of Care:  OP  Hospital Course:  [name] is a 39 y.o. male admitted for increased symptoms of depression and attempted to kill himself by overdosing on crack. He had been clean for 3 months after going through [facility] in Feb 2016. Patient relapsed over the last few weeks and has been using crack daily for the last week. Wife is threatening to leave him and not sure he can go back to home. She has three step-daughters and he is upset at the prospect of losing her and not seeing grandchildren. Patient still feels like he wants to kill himself but denies current plan.Patient has been thinking about it off and on over the past 6 months. patient stated that he went to a friend's home and used 3-4 grams of cocaine in an effort to kill himself. Patient says he tried to go to a local store to use a phone but he collapsed on the ground on the way.Patient has had one previous suicide attempt. He says at times he wants to harm others but has no current plan or intention. Patient denies any A/V hallucinations. Patient has lost his jobs and is about to lose his marriage because of his drug use. Patient uses ETOH and has used some tonight.  While a patient in this hospital and after admission assessment/evaluation, it was determined based on his symptoms that Bela will need medication management to re-stabilize his curren depressive mood symptoms. His UDS was positive for cocaine which he admitted having been using on a regular basis & with which he attempted to kill himself. Mr. Philipp Deputy did not receive any detoxification treatments. However, he was medicated & discharged on Fluoxetine 20 mg for depression & trazodone 100 mg for insomnia. He was enrolled in the group  counseling sessions being offered & held on this unit. He learned coping skills that should help him cope better and manage his symptoms effectively after discharge. He was also resumed on all his pertinent home medications for his other previously existing medical issues. He tolerated his treatment regimen without any significant adverse effects and or reactions presented.   Patient did respond positively to his treatment regimen. This is evidenced by his daily reports of improved mood, reduction of symptoms and presentation of good affect/eye contact. He met with his treatment team this am. His reason for admission, present symptoms, response to treatment and discharge plans discussed. Mr. Dyment endorsed that his symptoms has stabilized and that he is ready for discharge to pursue psychiatric care on an outpatient basis & also give a residential treatment program a shot to help him stop substance abuse for good. It was then agreed upon that  he will follow-up care at [facility] here in [place], [place]. He also has a screening date at the [facility] in [place], [place] on 12-22-14.  Upon discharge, Kenderick adamantly denies any suicidal, homicidal ideations, auditory, visual hallucinations, paranoia and or delusional thoughts. He was provided with a 7 days worth supply samples of his [facility] discharge medications. He left [facility] with all personal belongings via personal arranged transport in no apparent distress.   Consults:  psychiatry  Significant Diagnostic Studies:  labs: CBC with diff, CMP, UDS, toxicology tests, U/A, results reviewed, stable  Discharge Vitals:   Blood pressure 129/80, pulse 98, temperature 97.7 F (36.5 C), temperature source Oral, resp. rate 16, height '6\' 1"'  (1.854 m), weight 117.935 kg (260 lb), SpO2 97 %. Body mass index is 34.31 kg/(m^2). Lab Results:   No results found for this or any previous visit (from the past 72 hour(s)).  Physical Findings: AIMS:  Facial and Oral Movements Muscles of Facial Expression: None, normal Lips and Perioral Area: None, normal Jaw: None, normal Tongue: None, normal,Extremity Movements Upper (arms, wrists, hands, fingers): None, normal Lower (legs, knees, ankles, toes): None, normal, Trunk Movements Neck, shoulders, hips: None, normal, Overall Severity Severity of abnormal movements (highest score from questions above): None, normal Incapacitation due to abnormal movements: None, normal Patient's awareness of abnormal movements (rate only patient's report): No Awareness, Dental Status Current problems with teeth and/or dentures?: No Does patient usually wear dentures?: No  CIWA:  CIWA-Ar Total: 0 COWS:  COWS Total Score: 2   See Psychiatric Specialty Exam and Suicide Risk Assessment completed by Attending Physician prior to discharge.  Discharge destination:  Home  Is patient on multiple antipsychotic therapies at discharge:  No   Has Patient had three or more failed trials of antipsychotic monotherapy by history:  No  Recommended Plan for Multiple Antipsychotic Therapies: NA    Medication List    STOP taking these medications        ibuprofen 800 MG tablet  Commonly known as:  ADVIL,MOTRIN      TAKE these medications      Indication   FLUoxetine 20 MG capsule  Commonly known as:  PROZAC  Take 1 capsule (20 mg total) by mouth daily. For depression   Indication:  Major Depressive Disorder     lisinopril 10 MG tablet  Commonly known as:  PRINIVIL,ZESTRIL  Take 1 tablet (10 mg total) by mouth daily. For high blood pressure   Indication:  High Blood Pressure     nicotine 21 mg/24hr patch  Commonly known as:  NICODERM CQ - dosed in mg/24 hours  Place 1 patch (21 mg total) onto the skin daily. For smoking cessation   Indication:  Nicotine Addiction     traZODone 100 MG tablet  Commonly known as:  DESYREL  Take 1 tablet (100 mg total) by mouth at bedtime as needed for sleep.    Indication:  Trouble Sleeping     XARELTO STARTER PACK 15 & 20 MG Tbpk  Generic drug:  Rivaroxaban  Take 15-20 mg by mouth as directed. Take as directed on package: Start with one 17m tablet by mouth twice a day with food. On Day 22, switch to one 264mtablet once a day with food: Blood thinner to prevent blood clot   Indication:  Blood Clot in a Deep Vein       Follow-up Information    Follow up with [facility]  On 12/22/2014.   Why:  Arrive on this date at 8:00AM for screening and admission. Please bring photo Id with [place] residence and be prepared to be admitted on this day.    Contact information:   [address] [place], [place] [address] Phone: [phone] Fax: [phone]      [facility].   Why:  Walk in between 8am-9am Monday through Friday for hospital follow-up/medication management/assessment for therapy services.    Contact information:   [address] [place] [address] Phone: [phone] Fax: [phone]     Follow-up recommendations: Activity:  As tolerated Diet: As recommended by your primary care doctor. Keep all scheduled follow-up appointments as recommended.   Comments: Take all your medications as prescribed by your mental healthcare provider. Report any adverse effects and or reactions from your medicines to your outpatient provider promptly. Patient is instructed and cautioned to not engage in alcohol and or illegal drug use while on prescription medicines. In the event of worsening symptoms, patient is instructed to call the crisis hotline, 911 and or go to the nearest ED for appropriate evaluation and treatment of symptoms. Follow-up with your primary care provider for your other medical issues, concerns and or health care needs.   Total Discharge Time: Great than 30 minutes  Signed: Encarnacion Slates, PNHNP, FNP-BC 12/18/2014, 11:11 AM  I personally assessed the patient and formulated the plan Geralyn Flash A. Sabra Heck,  M.D.

## 2014-12-18 NOTE — BHH Suicide Risk Assessment (Signed)
[facility] Discharge Suicide Risk Assessment   Demographic Factors:  Male  Total Time spent with patient: 30 minutes  Musculoskeletal: Strength & Muscle Tone: within normal limits Gait & Station: normal Patient leans: normal  Psychiatric Specialty Exam: Physical Exam  Review of Systems  Constitutional: Negative.   HENT: Negative.   Eyes: Negative.   Respiratory: Negative.   Cardiovascular: Negative.   Gastrointestinal: Negative.   Genitourinary: Negative.   Musculoskeletal: Negative.   Skin: Negative.   Neurological: Negative.   Endo/Heme/Allergies: Negative.   Psychiatric/Behavioral: Positive for substance abuse.    Blood pressure 129/80, pulse 98, temperature 97.7 F (36.5 C), temperature source Oral, resp. rate 16, height  (1.854 m), weight 117.935 kg (260 lb), SpO2 97 %.Body mass index is 34.31 kg/(m^2).  General Appearance: Fairly Groomed  Patent attorney::  Fair  Speech:  Clear and Coherent409  Volume:  Normal  Mood:  Euthymic  Affect:  Restricted  Thought Process:  Coherent and Goal Directed  Orientation:  Full (Time, Place, and Person)  Thought Content:  plans as he moves on  Suicidal Thoughts:  No  Homicidal Thoughts:  No  Memory:  Immediate;   Fair Recent;   Fair Remote;   Fair  Judgement:  Fair  Insight:  Present and Shallow  Psychomotor Activity:  Normal  Concentration:  Fair  Recall:  [employer] of Knowledge:Fair  Language: Fair  Akathisia:  No  Handed:  Right  AIMS (if indicated):     Assets:  Desire for Improvement Talents/Skills  Sleep:  Number of Hours: 6.5  Cognition: WNL  ADL's:  Intact   Have you used any form of tobacco in the last 30 days? (Cigarettes, Smokeless Tobacco, Cigars, and/or Pipes): Yes  Has this patient used any form of tobacco in the last 30 days? (Cigarettes, Smokeless Tobacco, Cigars, and/or Pipes) Yes, A prescription for an FDA-approved tobacco cessation medication was offered at discharge and the patient refused  Mental  Status Per Nursing Assessment::   On Admission:     Current Mental Status by Physician: In full contact with reality. There are no active SI plans or intent. He is going to be admitted to [facility] on Monday AM. He is going to be D/C today. He states he is going to make his own arrangements in terms of securing a place to stay until Monday AM when he goes to [facility]   Loss Factors: NA  Historical Factors: NA  Risk Reduction Factors:   Sense of responsibility to family and Employed  Continued Clinical Symptoms:  Alcohol/Substance Abuse/Dependencies  Cognitive Features That Contribute To Risk:  Closed-mindedness, Polarized thinking and Thought constriction (tunnel vision)    Suicide Risk:  Minimal: No identifiable suicidal ideation.  Patients presenting with no risk factors but with morbid ruminations; may be classified as minimal risk based on the severity of the depressive symptoms  Principal Problem: Cocaine-induced mood disorder Discharge Diagnoses:  Patient Active Problem List   Diagnosis Date Noted  . Cocaine abuse [F14.10] 12/12/2014  . 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 Chest pain [R07.9] 01/23/2012  . Syncope [R55] 01/16/2012  . DVT (deep venous thrombosis) [I82.409] 01/16/2012  . Long term (current) use of anticoagulants [Z79.01] 08/16/2011  . TOBACCO USER [Z72.0] 12/27/2008  . NONDEPENDENT COCAINE ABUSE EPISODIC [F14.10] 10/01/2008  . 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   Why:  Arrive on this date at 8:00AM  for screening and admission. Please bring photo Id [place] residence and be prepared to be admitted on this day.    Contact information:   [address] [place], [place] [address] Phone: [phone] Fax: [phone]      Follow up with [facility].   Why:  Walk in between 8am-9am Monday through Friday for hospital  follow-up/medication management/assessment for therapy services.    Contact information:   [address], [place] [address] Phone: [phone] Fax: [phone]      Plan Of Care/Follow-up recommendations:  Activity:  as tolerated Diet:  regular Follow up [facility] as above Is patient on multiple antipsychotic therapies at discharge:  No   Has Patient had three or more failed trials of antipsychotic monotherapy by history:  No  Recommended Plan for Multiple Antipsychotic Therapies: NA    LUGO,IRVING A 12/18/2014, 12:48 PM

## 2014-12-18 NOTE — Progress Notes (Signed)
Found patient resting in bed, asleep. Awakened easily and came up to NS for meds however returned to bed, refusing group. Affect blunted, mood depressed however patient indicates no complaints other than, "I ate too much at breakfast." Forwards minimal information. Denies pain. Support and encouragement given. Medicated per orders. Denies SI/HI/AVH and remains safe. Lawrence [place]se[place]

## 2014-12-18 NOTE — Progress Notes (Addendum)
  [facility] Adult Case Management Discharge Plan :  Will you be returning to the same living situation after discharge:  No.Pt has [facility] admission date for Monday at 8am. He plans to stay with pastor or friend until then. Pt also given shelter info.  At discharge, do you have transportation home?: Yes,  "I will work out my own transportation" pt declined bus pass.  Do you have the ability to pay for your medications: Yes,  mental health  Release of information consent forms completed and submitted to medical records by CSW.  Patient to Follow up at: Follow-up Information    Follow up with [facility]  On 12/22/2014.   Why:  Arrive on this date at 8:00AM for screening and admission. Please bring photo Id [place] and be prepared to be admitted on this day.    Contact information:   [address] [place], [place] [address] Phone: [phone] Fax: [phone]      Follow up with [facility]   Why:  Walk in between 8am-9am Monday through Friday for hospital follow-up/medication management/assessment for therapy services.    Contact information:   [address], [place] [address] Phone: [phone] Fax: [phone]      Patient denies SI/HI: Yes,  during group/self report.    Safety Planning and Suicide Prevention discussed: Yes,  SPE completed with pt's wife. SPI pamphlet provided to pt and he was encouraged to share information with support network, ask questions, and talk about any concerns relating to SPE.  Have you used any form of tobacco in the last 30 days? (Cigarettes, Smokeless Tobacco, Cigars, and/or Pipes): Yes  Has patient been referred to the Quitline?: yes, faxed on 12/18/14  Smart, Heather LCSWA  12/18/2014, 10:47 AM

## 2014-12-18 NOTE — BHH Group Notes (Signed)
BHH Group Notes:  (Nursing/MHT/Case Management/Adjunct)  Date:  12/18/2014  Time:  0900  Type of Therapy:  Nurse Education - Positive Lifestyle Changes to Promote Recovery  Participation Level:  Did Not Attend  Participation Quality:    Affect:    Cognitive:    Insight:    Engagement in Group:    Modes of Intervention:    Summary of Progress/Problems: Patient was invited to group however elected to remain in bed.   Merian Capron [facility] 12/18/2014, 0930

## 2015-11-19 ENCOUNTER — Emergency Department (HOSPITAL_COMMUNITY)
Admission: EM | Admit: 2015-11-19 | Discharge: 2015-11-20 | Disposition: A | Discharge status: Other Acute Inpatient Hospital | Payer: Federal, State, Local not specified - Other | Attending: Emergency Medicine | Admitting: Emergency Medicine

## 2015-11-19 ENCOUNTER — Encounter (HOSPITAL_COMMUNITY): Payer: Self-pay | Admitting: *Deleted

## 2015-11-19 ENCOUNTER — Encounter (HOSPITAL_COMMUNITY): Payer: Self-pay

## 2015-11-19 ENCOUNTER — Ambulatory Visit (HOSPITAL_COMMUNITY)
Admission: RE | Admit: 2015-11-19 | Discharge: 2015-11-19 | Disposition: A | Discharge status: Home / Self Care | Payer: Federal, State, Local not specified - Other | Attending: Psychiatry | Admitting: Psychiatry

## 2015-11-19 DIAGNOSIS — Z79899 Other long term (current) drug therapy: Secondary | ICD-10-CM | POA: Insufficient documentation

## 2015-11-19 DIAGNOSIS — F17210 Nicotine dependence, cigarettes, uncomplicated: Secondary | ICD-10-CM | POA: Insufficient documentation

## 2015-11-19 DIAGNOSIS — F1410 Cocaine abuse, uncomplicated: Secondary | ICD-10-CM | POA: Insufficient documentation

## 2015-11-19 DIAGNOSIS — F329 Major depressive disorder, single episode, unspecified: Secondary | ICD-10-CM | POA: Insufficient documentation

## 2015-11-19 DIAGNOSIS — F1010 Alcohol abuse, uncomplicated: Secondary | ICD-10-CM | POA: Insufficient documentation

## 2015-11-19 DIAGNOSIS — F1910 Other psychoactive substance abuse, uncomplicated: Secondary | ICD-10-CM | POA: Insufficient documentation

## 2015-11-19 DIAGNOSIS — Z791 Long term (current) use of non-steroidal anti-inflammatories (NSAID): Secondary | ICD-10-CM | POA: Insufficient documentation

## 2015-11-19 DIAGNOSIS — F32A Depression, unspecified: Secondary | ICD-10-CM

## 2015-11-19 DIAGNOSIS — F1210 Cannabis abuse, uncomplicated: Secondary | ICD-10-CM | POA: Insufficient documentation

## 2015-11-19 LAB — CBC WITH DIFFERENTIAL/PLATELET
BASOS PCT: 0 %
Basophils Absolute: 0 10*3/uL (ref 0.0–0.1)
EOS ABS: 0.1 10*3/uL (ref 0.0–0.7)
EOS PCT: 2 %
HCT: 47.3 % (ref 39.0–52.0)
HEMOGLOBIN: 16.1 g/dL (ref 13.0–17.0)
LYMPHS ABS: 1.6 10*3/uL (ref 0.7–4.0)
Lymphocytes Relative: 20 %
MCH: 29.5 pg (ref 26.0–34.0)
MCHC: 34 g/dL (ref 30.0–36.0)
MCV: 86.6 fL (ref 78.0–100.0)
MONOS PCT: 8 %
Monocytes Absolute: 0.7 10*3/uL (ref 0.1–1.0)
NEUTROS PCT: 70 %
Neutro Abs: 5.6 10*3/uL (ref 1.7–7.7)
PLATELETS: 162 10*3/uL (ref 150–400)
RBC: 5.46 MIL/uL (ref 4.22–5.81)
RDW: 14.3 % (ref 11.5–15.5)
WBC: 8.1 10*3/uL (ref 4.0–10.5)

## 2015-11-19 LAB — COMPREHENSIVE METABOLIC PANEL
ALK PHOS: 50 U/L (ref 38–126)
ALT: 33 U/L (ref 17–63)
ANION GAP: 5 (ref 5–15)
AST: 27 U/L (ref 15–41)
Albumin: 4.1 g/dL (ref 3.5–5.0)
BILIRUBIN TOTAL: 0.9 mg/dL (ref 0.3–1.2)
BUN: 10 mg/dL (ref 6–20)
CALCIUM: 9.3 mg/dL (ref 8.9–10.3)
CO2: 30 mmol/L (ref 22–32)
CREATININE: 1.42 mg/dL — AB (ref 0.61–1.24)
Chloride: 105 mmol/L (ref 101–111)
Glucose, Bld: 106 mg/dL — ABNORMAL HIGH (ref 65–99)
Potassium: 3.8 mmol/L (ref 3.5–5.1)
Sodium: 140 mmol/L (ref 135–145)
TOTAL PROTEIN: 7.2 g/dL (ref 6.5–8.1)

## 2015-11-19 LAB — RAPID URINE DRUG SCREEN, HOSP PERFORMED
Amphetamines: NOT DETECTED
Barbiturates: NOT DETECTED
Benzodiazepines: NOT DETECTED
COCAINE: POSITIVE — AB
OPIATES: NOT DETECTED
TETRAHYDROCANNABINOL: POSITIVE — AB

## 2015-11-19 LAB — ETHANOL: Alcohol, Ethyl (B): <5 mg/dL (ref <5)

## 2015-11-19 LAB — I-STAT TROPONIN, ED: Troponin i, poc: 0.01 ng/mL (ref 0.00–0.08)

## 2015-11-19 MED ORDER — ALUM & MAG HYDROXIDE-SIMETH 200-200-20 MG/5ML PO SUSP: 30.0000 mL | ORAL | Status: DC | PRN

## 2015-11-19 MED ORDER — NICOTINE 21 MG/24HR TD PT24
21.0000 mg | MEDICATED_PATCH | Freq: Every day | TRANSDERMAL | Status: DC
  Filled 2015-11-19: qty 1

## 2015-11-19 MED ORDER — LISINOPRIL 10 MG PO TABS
10.0000 mg | ORAL_TABLET | Freq: Every day | ORAL | Status: DC
  Administered 2015-11-20: 10 mg via ORAL
  Filled 2015-11-19 (×2): qty 1

## 2015-11-19 MED ORDER — TRAZODONE HCL 100 MG PO TABS: 100.0000 mg | ORAL_TABLET | Freq: Every evening | ORAL | Status: DC | PRN

## 2015-11-19 MED ORDER — VITAMIN B-1 100 MG PO TABS
100.0000 mg | ORAL_TABLET | Freq: Every day | ORAL | Status: DC
  Administered 2015-11-20: 100 mg via ORAL
  Filled 2015-11-19: qty 1

## 2015-11-19 MED ORDER — LORAZEPAM 1 MG PO TABS
0.0000 mg | ORAL_TABLET | Freq: Two times a day (BID) | ORAL | Status: DC
Start: 2015-11-21 — End: 2015-11-20

## 2015-11-19 MED ORDER — IBUPROFEN 200 MG PO TABS: 600.0000 mg | ORAL_TABLET | Freq: Three times a day (TID) | ORAL | Status: DC | PRN

## 2015-11-19 MED ORDER — LORAZEPAM 1 MG PO TABS
0.0000 mg | ORAL_TABLET | Freq: Four times a day (QID) | ORAL | Status: DC
  Administered 2015-11-19: 1 mg via ORAL
  Filled 2015-11-19: qty 1

## 2015-11-19 NOTE — ED Triage Notes (Signed)
Pt presents with c/o request for detox from cocaine, last use was last night. Pt also reporting some depression, denies SI/HI at this time.

## 2015-11-19 NOTE — Progress Notes (Signed)
Patient listed as [place] a pcp or [place].  [facility], informed patient of services there and walk in times.  EDCM also provided patient with list of pcps who accept self pay patients, list of discount pharmacies and websites needymeds.org and GoodRX.com for medication assistance, phone number to inquire about the orange card, phone number to inquire about Mediciad, phone number to inquire about the Affordable Care Act, financial resources in the community such as local churches, salvation army, urban ministries, and dental assistance for uninsured patients.  Also provided [facility] information [facility].  Patient asleep at this time.  This information was placed in patient's binder at nurse's station.  No further EDCM needs at this time.

## 2015-11-19 NOTE — ED Provider Notes (Signed)
[facility]-EMERGENCY DEPT Provider Note   CSN: [id number] Arrival date & time: 11/19/15  1644     History   Chief Complaint Chief Complaint  Patient presents with  . Depression  . Detox    HPI [name] is a 40 y.o. male.  HPI Patient presents to the emergency room for evaluation of cocaine abuse. [facility] where he reported difficulties with alcohol and drug abuse. He has been drinking about 12 beers per day and has been using 3 g of cocaine. Patient had been sober for a period of time until he  started using drugs and alcohol about one month ago after his mother passed away.  Patient denies any suicidal or homicidal ideation. The patient [facility] They recommended inpatient treatment. [facility] for medical clearance and placement. Patient notes to having some chest pain earlier after his cocaine use but nothing currently.  Past Medical History:  Diagnosis Date  . Cocaine abuse   . DVT of leg (deep venous thrombosis) (HCC) 01/16/2012   right  . Family history of early CAD    Father deceased at 336 yo with MI  . PE (pulmonary embolism) 2011; 07/2011  . Syncope and collapse 01/16/2012   "woke up to dog licking my face" (01/16/2012)  . Tobacco abuse     Patient Active Problem List   Diagnosis Date Noted  . Cocaine abuse 12/12/2014  . 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 Chest pain 01/23/2012  . Syncope 01/16/2012  . DVT (deep venous thrombosis) (HCC) 01/16/2012  . Long term (current) use of anticoagulants 08/16/2011  . TOBACCO USER 12/27/2008  . NONDEPENDENT COCAINE ABUSE EPISODIC 10/01/2008  . 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 NO PAST SURGERIES         Home Medications    Prior to Admission medications   Medication Sig Start Date End Date Taking? Authorizing  Provider  ibuprofen (ADVIL,MOTRIN) 200 MG tablet Take 800 mg by mouth every 6 (six) hours as needed for moderate pain.   Yes Historical Provider, MD  FLUoxetine (PROZAC) 20 MG capsule Take 1 capsule (20 mg total) by mouth daily. For depression Patient not taking: Reported on 11/19/2015 12/18/14   Sanjuana KavaAgnes I Nwoko, NP  lisinopril (PRINIVIL,ZESTRIL) 10 MG tablet Take 1 tablet (10 mg total) by mouth daily. For high blood pressure Patient not taking: Reported on 11/19/2015 12/18/14   Sanjuana KavaAgnes I Nwoko, NP  nicotine (NICODERM CQ - DOSED IN MG/24 HOURS) 21 mg/24hr patch Place 1 patch (21 mg total) onto the skin daily. For smoking cessation Patient not taking: Reported on 11/19/2015 12/18/14   Sanjuana KavaAgnes I Nwoko, NP  traZODone (DESYREL) 100 MG tablet Take 1 tablet (100 mg total) by mouth at bedtime as needed for sleep. Patient not taking: Reported on 11/19/2015 12/18/14   Sanjuana KavaAgnes I Nwoko, NP  XARELTO STARTER PACK 15 & 20 MG TBPK Take 15-20 mg by mouth as directed. Take as directed on package: Start with one 15mg  tablet by mouth twice a day with food. On Day 22, switch to one 20mg  tablet once a day with food: Blood thinner to prevent blood clot Patient not taking: Reported on 11/19/2015 12/18/14   Sanjuana KavaAgnes I Nwoko, NP    Family History Family History  Problem Relation Age of Onset  . Coronary artery disease Father [id number]  MI at age 59  . Colon cancer Paternal Grandmother   . Cancer Maternal Grandfather     Social History Social History  Substance Use Topics  . Smoking status: Current Every Day Smoker    Packs/day: 0.50    Years: 5.00    Types: Cigarettes  . Smokeless tobacco: Never Used  . Alcohol use Yes     Comment: occasion     Allergies   Review of patient's allergies indicates no known allergies.   Review of Systems Review of Systems  All other systems reviewed and are negative.    Physical Exam Updated Vital Signs BP 141/81 (BP Location: Left Arm)   Pulse 96   Temp 98.3 F (36.8 C) (Oral)   Resp  12   Ht 6\' 5"  (1.956 m)   Wt 120.2 kg   SpO2 96%   BMI 31.42 kg/m   Physical Exam  Constitutional: He appears well-developed and well-nourished. No distress.  HENT:  Head: Normocephalic and atraumatic.  Right Ear: External ear normal.  Left Ear: External ear normal.  Eyes: Conjunctivae are normal. Right eye exhibits no discharge. Left eye exhibits no discharge. No scleral icterus.  Neck: Neck supple. No tracheal deviation present.  Cardiovascular: Normal rate, regular rhythm and intact distal pulses.   Pulmonary/Chest: Effort normal and breath sounds normal. No stridor. No respiratory distress. He has no wheezes. He has no rales.  Abdominal: Soft. Bowel sounds are normal. He exhibits no distension. There is no tenderness. There is no rebound and no guarding.  Musculoskeletal: He exhibits no edema or tenderness.  Neurological: He is alert. He has normal strength. No cranial nerve deficit (no facial droop, extraocular movements intact, no slurred speech) or sensory deficit. He exhibits normal muscle tone. He displays no seizure activity. Coordination normal.  Skin: Skin is warm and dry. No rash noted.  Psychiatric: He has a normal mood and affect. His speech is normal and behavior is normal. Thought content normal. Cognition and memory are normal.  Nursing note and vitals reviewed.    ED Treatments / Results  Labs (all labs ordered are listed, but only abnormal results are displayed) Labs Reviewed  COMPREHENSIVE METABOLIC PANEL - Abnormal; Notable for the following:       Result Value   Glucose, Bld 106 (*)    Creatinine, Ser 1.42 (*)    All other components within normal limits  ETHANOL  CBC WITH DIFFERENTIAL/PLATELET  URINE RAPID DRUG SCREEN, HOSP PERFORMED  I-STAT TROPOININ, ED    EKG  EKG Interpretation  Date/Time:  Thursday November 19 2015 17:45:18 EDT Ventricular Rate:  81 PR Interval:    QRS Duration: 87 QT Interval:  376 QTC Calculation: 437 R Axis:   50 Text  Interpretation:  Sinus rhythm Abnormal R-wave progression, early transition Minimal ST elevation, anterior leads No significant change since last tracing Confirmed by Olga Bourbeau  MD-J, Nailyn Dearinger ([id number]) on 11/19/2015 5:48:54 PM      Procedures Procedures (including critical care time)   Initial Impression / Assessment and Plan / ED Course  I have reviewed the triage vital signs and the nursing notes.  Pertinent labs & imaging results that were available during my care of the patient were reviewed by me and considered in my medical decision making (see chart for details).  Clinical Course    Labs reviewed.  No acute abnormalities.  Medically cleared at 7:10 PM.  Holding orders written  Final Clinical Impressions(s) / ED Diagnoses   Final diagnoses:  Substance  abuse  Depression    New Prescriptions New Prescriptions   No medications on file     Linwood Dibbles, MD 11/19/15 1911

## 2015-11-19 NOTE — ED Notes (Signed)
Pt wanded by security and changed into purple scrubs. 
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
## 2015-11-19 NOTE — ED Notes (Signed)
SBAR Report received from previous nurse. Pt received calm and visible on unit. Pt denies current SI/ HI, A/V H, depression, anxiety, and pain at this time, and is otherwise stable. Pt reminded of camera surveillance, q 15 min rounds, and rules of the milieu. Will continue to assess. 

## 2015-11-20 ENCOUNTER — Inpatient Hospital Stay (HOSPITAL_COMMUNITY)
Admission: AD | Admit: 2015-11-20 | Discharge: 2015-11-25 | DRG: 885 | Disposition: A | Discharge status: Home / Self Care | Payer: Federal, State, Local not specified - Other | Source: Intra-hospital | Attending: Psychiatry | Admitting: Psychiatry

## 2015-11-20 ENCOUNTER — Encounter (HOSPITAL_COMMUNITY): Payer: Self-pay

## 2015-11-20 DIAGNOSIS — F1220 Cannabis dependence, uncomplicated: Secondary | ICD-10-CM | POA: Diagnosis present

## 2015-11-20 DIAGNOSIS — F332 Major depressive disorder, recurrent severe without psychotic features: Secondary | ICD-10-CM

## 2015-11-20 DIAGNOSIS — Z800 Family history of malignant neoplasm of digestive organs: Secondary | ICD-10-CM | POA: Diagnosis not present

## 2015-11-20 DIAGNOSIS — F1420 Cocaine dependence, uncomplicated: Secondary | ICD-10-CM | POA: Diagnosis present

## 2015-11-20 DIAGNOSIS — Z86711 Personal history of pulmonary embolism: Secondary | ICD-10-CM | POA: Diagnosis not present

## 2015-11-20 DIAGNOSIS — F1410 Cocaine abuse, uncomplicated: Secondary | ICD-10-CM

## 2015-11-20 DIAGNOSIS — Z808 Family history of malignant neoplasm of other organs or systems: Secondary | ICD-10-CM | POA: Diagnosis not present

## 2015-11-20 DIAGNOSIS — I10 Essential (primary) hypertension: Secondary | ICD-10-CM | POA: Diagnosis present

## 2015-11-20 DIAGNOSIS — Z86718 Personal history of other venous thrombosis and embolism: Secondary | ICD-10-CM

## 2015-11-20 DIAGNOSIS — F322 Major depressive disorder, single episode, severe without psychotic features: Secondary | ICD-10-CM | POA: Diagnosis present

## 2015-11-20 DIAGNOSIS — F1494 Cocaine use, unspecified with cocaine-induced mood disorder: Secondary | ICD-10-CM | POA: Diagnosis not present

## 2015-11-20 DIAGNOSIS — F17210 Nicotine dependence, cigarettes, uncomplicated: Secondary | ICD-10-CM | POA: Diagnosis present

## 2015-11-20 DIAGNOSIS — F339 Major depressive disorder, recurrent, unspecified: Secondary | ICD-10-CM | POA: Diagnosis present

## 2015-11-20 MED ORDER — TRAZODONE HCL 50 MG PO TABS
50.0000 mg | ORAL_TABLET | Freq: Every evening | ORAL | Status: DC | PRN
  Administered 2015-11-20: 50 mg via ORAL
  Filled 2015-11-20: qty 1

## 2015-11-20 MED ORDER — LISINOPRIL 10 MG PO TABS
10.0000 mg | ORAL_TABLET | Freq: Every day | ORAL | Status: DC
  Filled 2015-11-20: qty 1
  Filled 2015-11-20: qty 2
  Filled 2015-11-20 (×7): qty 1

## 2015-11-20 MED ORDER — ONDANSETRON 4 MG PO TBDP: 4.0000 mg | ORAL_TABLET | Freq: Four times a day (QID) | ORAL | Status: AC | PRN

## 2015-11-20 MED ORDER — NICOTINE 21 MG/24HR TD PT24
21.0000 mg | MEDICATED_PATCH | Freq: Every day | TRANSDERMAL | Status: DC
  Filled 2015-11-20 (×8): qty 1

## 2015-11-20 MED ORDER — VITAMIN B-1 100 MG PO TABS
100.0000 mg | ORAL_TABLET | Freq: Every day | ORAL | Status: DC
  Filled 2015-11-20 (×8): qty 1

## 2015-11-20 MED ORDER — LOPERAMIDE HCL 2 MG PO CAPS: 2.0000 mg | ORAL_CAPSULE | ORAL | Status: AC | PRN

## 2015-11-20 MED ORDER — ALUM & MAG HYDROXIDE-SIMETH 200-200-20 MG/5ML PO SUSP: 30.0000 mL | ORAL | Status: DC | PRN

## 2015-11-20 MED ORDER — ADULT MULTIVITAMIN W/MINERALS CH
1.0000 | ORAL_TABLET | Freq: Every day | ORAL | Status: DC
  Administered 2015-11-20: 1 via ORAL
  Filled 2015-11-20 (×10): qty 1

## 2015-11-20 MED ORDER — NICOTINE 21 MG/24HR TD PT24
21.0000 mg | MEDICATED_PATCH | Freq: Every day | TRANSDERMAL | Status: DC
  Filled 2015-11-20 (×2): qty 1

## 2015-11-20 MED ORDER — ACETAMINOPHEN 325 MG PO TABS
650.0000 mg | ORAL_TABLET | Freq: Four times a day (QID) | ORAL | Status: DC | PRN
  Administered 2015-11-21: 650 mg via ORAL
  Filled 2015-11-20: qty 2

## 2015-11-20 MED ORDER — HYDROXYZINE HCL 25 MG PO TABS
25.0000 mg | ORAL_TABLET | Freq: Four times a day (QID) | ORAL | Status: DC | PRN
  Administered 2015-11-20: 25 mg via ORAL
  Filled 2015-11-20: qty 1

## 2015-11-20 MED ORDER — RIVAROXABAN 20 MG PO TABS: 20.0000 mg | ORAL_TABLET | Freq: Two times a day (BID) | ORAL | Status: DC

## 2015-11-20 MED ORDER — MAGNESIUM HYDROXIDE 400 MG/5ML PO SUSP: 30.0000 mL | Freq: Every day | ORAL | Status: DC | PRN

## 2015-11-20 MED ORDER — LORAZEPAM 1 MG PO TABS
1.0000 mg | ORAL_TABLET | Freq: Four times a day (QID) | ORAL | Status: AC | PRN
  Administered 2015-11-21 – 2015-11-22 (×2): 1 mg via ORAL
  Filled 2015-11-20 (×2): qty 1

## 2015-11-20 MED ORDER — IBUPROFEN 600 MG PO TABS: 600.0000 mg | ORAL_TABLET | Freq: Three times a day (TID) | ORAL | Status: DC | PRN

## 2015-11-20 MED ORDER — ACETAMINOPHEN 325 MG PO TABS: 650.0000 mg | ORAL_TABLET | Freq: Four times a day (QID) | ORAL | Status: DC | PRN

## 2015-11-20 MED ORDER — TRAZODONE HCL 100 MG PO TABS: 100.0000 mg | ORAL_TABLET | Freq: Every evening | ORAL | Status: DC | PRN

## 2015-11-20 MED ORDER — LISINOPRIL 10 MG PO TABS: 10.0000 mg | ORAL_TABLET | Freq: Every day | ORAL | Status: DC

## 2015-11-20 MED ORDER — RIVAROXABAN 20 MG PO TABS
20.0000 mg | ORAL_TABLET | Freq: Every day | ORAL | Status: DC
  Filled 2015-11-20 (×8): qty 1

## 2015-11-20 NOTE — BH Assessment (Signed)
Patient has been referred to the following facilities for treatment:  [facility] - [phone] [facility] - [phone] [facility] - [phone] [facility] - [phone] [facility] - [phone] [name] - [phone] [facility] - [phone] [facility] - [phone] [facility] - [phone]  Davina PokeJoVea Karey Stucki, [place] Therapeutic Triage Specialist [facility] 11/20/2015 6:04 AM

## 2015-11-20 NOTE — Progress Notes (Signed)
Writer approached pt earlier this shift on arrival to unit and introduced self. Pt presents very irritable with poor eye contact. Refused scheduled medications when offered," why y'all want to give me some damn blood pressure medicines, my blood pressure was fine when I got here, I don't need it". Pt went to cafeteria for meals with peers, returned without issues. Support provided. Safety maintained on and off unit.

## 2015-11-20 NOTE — Progress Notes (Signed)
Baldo AshCarl is a 40 y.o. male being admitted voluntarily to 306-1 from [facility].  He came in to the ED reporting symptoms of depression, detox from crack and alcohol. He has been drinking 12 beers a day for about 2 weeks and using 3 grams of cocaine.  He has a history of depression and substance abuse.  He states he has been clean since last September until about a month ago when his mother died.  Pt [facility], and his wife said it helped, but he did not notice any difference.  Pt denied SI, HI, AVH, but is tearful and anxious.  He acknowledges symptoms including crying spells, social withdrawal, anhedonia, decreased concentration, fatigue, irritability, decreased sleep, decreased appetite and feelings of hopelessness. He denies SI/HI or A/V hallucinations.  He denies any medical problems and appears to be in no physical distress.  Oriented him to the unit.  Admission paperwork completed and signed.  Belongings searched and secured in locker #  40.  Skin assessment completed and No skin issues noted.  Q 15 minute checks initiated for safety.  We will monitor the progress towards his goals.

## 2015-11-20 NOTE — Tx Team (Signed)
Initial Treatment Plan 11/20/2015 2:39 PM Smith Robertarl W Keddy [id number]    PATIENT STRESSORS: Financial difficulties Loss of Mother Marital or family conflict   PATIENT STRENGTHS: Wellsite geologistCommunication skills General fund of knowledge Motivation for treatment/growth   PATIENT IDENTIFIED PROBLEMS: Substance abuse  Anxiety   Depression  "Get clean"  "Get re-calibrated"             DISCHARGE CRITERIA:  Improved stabilization in mood, thinking, and/or behavior Withdrawal symptoms are absent or subacute and managed without 24-hour nursing intervention  PRELIMINARY DISCHARGE PLAN: Outpatient therapy Medication management  PATIENT/FAMILY INVOLVEMENT: This treatment plan has been presented to and reviewed with the patient, Smith RobertCarl W Guimaraes.  The patient and family have been given the opportunity to ask questions and make suggestions.  Levin BaconHeather V Kermitt Harjo, RN 11/20/2015, 2:39 PM

## 2015-11-20 NOTE — ED Notes (Signed)
Pt discharged ambulatory with [place] driver  All belongings sent with patient.

## 2015-11-20 NOTE — BH Assessment (Signed)
[facility] Assessment Progress Note  Per morning shift report, this pt requires psychiatric hospitalization at this time.  Lillia AbedLindsay, RN, [facility]; they will be ready to receive pt at 11:30.  Pt has signed Voluntary Admission and Consent for Treatment, as well as Consent to Release Information to this wife and his pastor, and signed forms have [facility].  Pt's nurse, Kendal Hymendie, has been notified, and agrees to send original paperwork along with pt via [facility], and to call report to [phone].  Doylene Canninghomas Lawren Sexson, MA Triage Specialist [phone]

## 2015-11-21 DIAGNOSIS — F1494 Cocaine use, unspecified with cocaine-induced mood disorder: Secondary | ICD-10-CM

## 2015-11-21 DIAGNOSIS — F322 Major depressive disorder, single episode, severe without psychotic features: Secondary | ICD-10-CM

## 2015-11-21 DIAGNOSIS — Z808 Family history of malignant neoplasm of other organs or systems: Secondary | ICD-10-CM

## 2015-11-21 DIAGNOSIS — Z800 Family history of malignant neoplasm of digestive organs: Secondary | ICD-10-CM

## 2015-11-21 MED ORDER — HYDROXYZINE HCL 25 MG PO TABS
25.0000 mg | ORAL_TABLET | Freq: Four times a day (QID) | ORAL | Status: AC | PRN
  Administered 2015-11-21 – 2015-11-22 (×2): 25 mg via ORAL
  Filled 2015-11-21 (×2): qty 1

## 2015-11-21 MED ORDER — TRAZODONE HCL 50 MG PO TABS
50.0000 mg | ORAL_TABLET | Freq: Every day | ORAL | Status: DC
Start: 2015-11-21 — End: 2015-11-25
  Administered 2015-11-21 – 2015-11-23 (×3): 50 mg via ORAL
  Filled 2015-11-21 (×5): qty 1
  Filled 2015-11-21: qty 7
  Filled 2015-11-21 (×2): qty 1

## 2015-11-21 MED ORDER — BUPROPION HCL ER (XL) 150 MG PO TB24
150.0000 mg | ORAL_TABLET | Freq: Every day | ORAL | Status: DC
Start: 2015-11-21 — End: 2015-11-25
  Administered 2015-11-21: 150 mg via ORAL
  Filled 2015-11-21 (×7): qty 1

## 2015-11-21 NOTE — [facility] Group Notes (Signed)
[facility] Group Notes:  (Nursing/MHT/Case Management/Adjunct)  Date:  11/21/2015  Time:  3:28 PM  Type of Therapy:  Psychoeducational Skills  Participation Level:  Did Not Attend  Almira Barenny G Weston Kallman 11/21/2015, 3:28 PM

## 2015-11-21 NOTE — [facility] Suicide Risk Assessment (Signed)
[facility]   Nursing information obtained from:  Patient Demographic factors:  Male, Caucasian Current Mental Status:  NA Loss Factors:  Decrease in vocational status, Loss of significant relationship, Decline in physical health, Financial problems / change in socioeconomic status, Legal issues Historical Factors:  Prior suicide attempts, Impulsivity Risk Reduction Factors:  Employed, Living with another person, especially a relative  Total Time spent with patient: 15 minutes Principal Problem: <principal problem not specified> Diagnosis:   Patient Active Problem List   Diagnosis Date Noted  . MDD (major depressive disorder), severe (HCC) [F32.2] 11/20/2015  . Cocaine abuse [F14.10] 12/12/2014  . Cocaine-induced mood disorder (HCC) [F14.94] 12/12/2014  . Chest pain [R07.9] 01/23/2012  . Syncope [R55] 01/16/2012  . DVT (deep venous thrombosis) (HCC) [I82.409] 01/16/2012  . Long term (current) use of anticoagulants [Z79.01] 08/16/2011  . TOBACCO USER [F17.200] 12/27/2008  . NONDEPENDENT COCAINE ABUSE EPISODIC [F14.10] 10/01/2008  . 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  ontinued Clinical Symptoms:  Alcohol Use Disorder Identification Test Final Score (AUDIT): 25 The "Alcohol Use Disorders Identification Test", Guidelines for Use in Primary Care, Second Edition.  World Science writerHealth Organization [facility](WHO). Score between 0-7:  no or low risk or alcohol related problems. Score between 8-15:  moderate risk of alcohol related problems. Score between 16-19:  high risk of alcohol related problems. Score 20 or above:  warrants further diagnostic evaluation for alcohol dependence and treatment.   CLINICAL FACTORS:   Depression:   Anhedonia Comorbid alcohol abuse/dependence Hopelessness Impulsivity Insomnia   Musculoskeletal: Strength & Muscle Tone: within normal  limits Gait & Station: normal Patient leans: N/A  Psychiatric Specialty Exam: Physical Exam  ROS  Blood pressure (!) 144/87, pulse 72, temperature 98 F (36.7 C), temperature source Oral, resp. rate 17, height 6\' 5"  (1.956 m), weight 261 lb (118.4 kg), SpO2 98 %.Body mass index is 30.95 kg/m.  General Appearance: Disheveled  Eye Contact:  Minimal  Speech:  Garbled and Slow  Volume:  Decreased  Mood:  Anxious, Depressed, Hopeless and Irritable  Affect:  Constricted and Depressed  Thought Process:  Coherent  Orientation:  Full (Time, Place, and Person)  Thought Content:  WDL  Suicidal Thoughts:  No  Homicidal Thoughts:  No  Memory:  Immediate;   Fair Recent;   Fair Remote;   Fair  Judgement:  Impaired  Insight:  Shallow  Psychomotor Activity:  Decreased  Concentration:  Concentration: Fair and Attention Span: Fair  Recall:  [employer]Fair  Fund of Knowledge:  Fair  Language:  Fair  Akathisia:  No  Handed:  Right  AIMS (if indicated):     Assets:  Desire for Improvement  ADL's:  Intact  Cognition:  WNL  Sleep:  Number of Hours: 5.75      COGNITIVE FEATURES THAT CONTRIBUTE TO RISK:  Closed-mindedness and Thought constriction (tunnel vision)    SUICIDE RISK:   Mild:  Suicidal ideation of limited frequency, intensity, duration, and specificity.  There are no identifiable plans, no associated intent, mild dysphoria and related symptoms, good self-control (both objective and subjective assessment), few other risk factors, and identifiable protective factors, including available and accessible social support.   PLAN OF CARE:  Patient to be monitored every 15 minutes. Patient to be introduced to the therapeutic milieu Patient denies any withdrawal symptoms and to monitor for any withdrawal symptoms Monitor for mood and safety  Encourage patient to attend groups  I certify that inpatient services furnished can reasonably be expected to improve the patient's condition.  Patrick North, MD 11/21/2015, 1:18 PM

## 2015-11-21 NOTE — Progress Notes (Signed)
Patient did attend the evening speaker AA meeting.  

## 2015-11-21 NOTE — BHH Group Notes (Signed)
B[facility] LCSW Group Therapy  11/21/2015 10:00 AM  Type of Therapy:  Group Therapy  Participation Level:  Did Not Attend   Beverly Sessionsywan J Anamae Rochelle 11/21/2015, 5:51 PM

## 2015-11-21 NOTE — Progress Notes (Signed)
D: Patient lying in bed during assessment. Unwilling to cooperative with this Clinical research associatewriter. Refuses to attend group despite much encouragement from this Clinical research associatewriter. Denies pain, SI, AH/VH.  MHT Notified this Clinical research associatewriter that patient approached her after talking to someone on the phone and requested to gather his belongings to go home. When this writer approached patient, he stated "I'm good. Am not trying to leave. Its just family problem. I am fine now".  A: Staff encouraged patient to continue with the treatment plan and verbalize needs to staff. Every 15 minutes check for safety maintained. Will continue to monitor patient for safety and stability.  R: Patient remains safe.

## 2015-11-21 NOTE — Plan of Care (Signed)
Problem: Activity: Goal: Interest or engagement in activities will improve Outcome: Not Progressing Patient has remained in bed all day. He has refused to get up for groups, meals and even to see the provider.

## 2015-11-21 NOTE — H&P (Signed)
Psychiatric Admission Assessment Adult  Patient Identification: [name]  MRN:  [id number]  Date of Evaluation:  11/21/2015  Chief Complaint: Increased drug & alcohol use.    Principal Diagnosis: Polysubstance dependence, Major depressive disorder, recurrent episodes.  Diagnosis:   Patient Active Problem List   Diagnosis Date Noted  . MDD (major depressive disorder), severe ([place]) [F32.2] 11/20/2015  . Cocaine abuse [F14.10] 12/12/2014  . Cocaine-induced mood disorder ([place]) [F14.94] 12/12/2014  . Chest pain [R07.9] 01/23/2012  . Syncope [R55] 01/16/2012  . DVT (deep venous thrombosis) ([place]) [I82.409] 01/16/2012  . Long term (current) use of anticoagulants [Z79.01] 08/16/2011  . TOBACCO USER [F17.200] 12/27/2008  . NONDEPENDENT COCAINE ABUSE EPISODIC [F14.10] 10/01/2008  . PULMONARY EMBOLISM [I26.99] 08/26/2008   History of Present Illness: This is an admission assessment for [name], a 40 year old AA male with hx of polysubstance dependence. Has been a patient in this hospital previously due to substance abuse issues as well. He is being admitted from the [facility] ED with complaints of increased drug & alcohol abuse. During this assessment, Sherard reports very angrily' " Why can't you read my statement from the ED.  What is it with you all asking me the same fucking questions over & over. I'm tired. I don't need to be talking no body right now, shit. Yes, I went to the [place] ED on Thursday. Yes I went by myself. I have been using the fucking cocaine & alcohol x 2 weeks. I was smoking 3 grams of crack & drinking 6 packs of beer daily for 2 weeks. I was clean for almost a year prior to this. I was in the [facility] program for substance abuse last year. I stopped going after I completed the program x 6 months. I relapsed 2 weeks ago. I don't know why I relapsed. I'm not depressed or suicidal. But, if you want me to rate my depression, then it is #10. I don't have any withdrawal symptoms at  this time. Can I go now".  Associated Signs/Symptoms:  Depression Symptoms:  depressed mood, hopelessness,  (Hypo) Manic Symptoms:  Impulsivity, Irritable Mood,  Anxiety Symptoms:  Excessive Worry,  Psychotic Symptoms:  Denies any hallucinations, delusional thoughts or paranoia.  PTSD Symptoms: None reported  Total Time spent with patient: 1 hour  Past Psychiatric History: Polysubstance dependence  Is the patient at risk to self? No. ,denies Has the patient been a risk to self in the past 6 months? No.  Has the patient been a risk to self within the distant past? No.  Is the patient a risk to others? No.  Has the patient been a risk to others in the past 6 months? No.  Has the patient been a risk to others within the distant past? No.   Prior Inpatient Therapy: Yes, [facility]) Prior Outpatient Therapy: Yes, [facility].  Alcohol Screening: 1. How often do you have a drink containing alcohol?: 4 or more times a week 2. How many drinks containing alcohol do you have on a typical day when you are drinking?: 10 or more 3. How often do you have six or more drinks on one occasion?: Daily or almost daily Preliminary Score: 8 4. How often during the last year have you found that you were not able to stop drinking once you had started?: Weekly 5. How often during the last year have you failed to do what was normally expected from you becasue of drinking?: Daily or almost daily 6. How often  during the last year have you needed a first drink in the morning to get yourself going after a heavy drinking session?: Never 7. How often during the last year have you had a feeling of guilt of remorse after drinking?: Never 8. How often during the last year have you been unable to remember what happened the night before because you had been drinking?: Monthly 9. Have you or someone else been injured as a result of your drinking?: No 10. Has a relative or friend or a doctor or another health worker been  concerned about your drinking or suggested you cut down?: Yes, during the last year Alcohol Use Disorder Identification Test Final Score (AUDIT): 25 Brief Intervention: Yes  Substance Abuse History in the last 12 months:  Yes.    Consequences of Substance Abuse: Medical Consequences:  Liver damage, Possible death by overdose Legal Consequences:  Arrests, jail time, Loss of driving privilege. Family Consequences:  Family discord, divorce and or separation.  Previous Psychotropic Medications: Yes   Psychological Evaluations: Yes   Past Medical History:  Past Medical History:  Diagnosis Date  . Cocaine abuse   . DVT of leg (deep venous thrombosis) ([place]) 01/16/2012   right  . Family history of early CAD    Father deceased at 38 yo with MI  . PE (pulmonary embolism) 2011; 07/2011  . Syncope and collapse 01/16/2012   "woke up to dog licking my face" (64/15/8309)  . Tobacco abuse     Past Surgical History:  Procedure Laterality Date  . NO PAST SURGERIES     Family History:  Family History  Problem Relation Age of Onset  . Coronary artery disease Father 22    MI at age 75  . Colon cancer Paternal Grandmother   . Cancer Maternal Grandfather    Family Psychiatric  History:   Tobacco Screening: Have you used any form of tobacco in the last 30 days? (Cigarettes, Smokeless Tobacco, Cigars, and/or Pipes): Yes Tobacco use, Select all that apply: 5 or more cigarettes per day Are you interested in Tobacco Cessation Medications?: No, patient refused Counseled patient on smoking cessation including recognizing danger situations, developing coping skills and basic information about quitting provided: Refused/Declined practical counseling Social History:  History  Alcohol Use  . Yes    Comment: occasion     History  Drug Use  . Types: Cocaine, Marijuana    Additional Social History: Pain Medications: none Prescriptions: denies Over the Counter: none History of alcohol / drug  use?: Yes Negative Consequences of Use: Legal, Personal relationships, Work / School Withdrawal Symptoms: Sweats, Patient aware of relationship between substance abuse and physical/medical complications Name of Substance 1: crack 1 - Age of First Use: 21 1 - Amount (size/oz): 3 grams 1 - Frequency: daily 1 - Duration: 2010 1 - Last Use / Amount: 11/18/15-3 grams Name of Substance 2: alcohol 2 - Age of First Use: 20 2 - Amount (size/oz): 12 pk/day 2 - Frequency: daily 2 - Duration: 1 week 2 - Last Use / Amount: 11/18/15  Allergies:  No Known Allergies  Lab Results:  Results for orders placed or performed during the hospital encounter of 11/19/15 (from the past 48 hour(s))  Comprehensive metabolic panel     Status: Abnormal   Collection Time: 11/19/15  6:08 PM  Result Value Ref Range   Sodium 140 135 - 145 mmol/L   Potassium 3.8 3.5 - 5.1 mmol/L   Chloride 105 101 - 111 mmol/L  CO2 30 22 - 32 mmol/L   Glucose, Bld 106 (H) 65 - 99 mg/dL   BUN 10 6 - 20 mg/dL   Creatinine, Ser 1.42 (H) 0.61 - 1.24 mg/dL   Calcium 9.3 8.9 - 10.3 mg/dL   Total Protein 7.2 6.5 - 8.1 g/dL   Albumin 4.1 3.5 - 5.0 g/dL   AST 27 15 - 41 U/L   ALT 33 17 - 63 U/L   Alkaline Phosphatase 50 38 - 126 U/L   Total Bilirubin 0.9 0.3 - 1.2 mg/dL   GFR calc non Af Amer >60 >60 mL/min   GFR calc Af Amer >60 >60 mL/min    Comment: (NOTE) The eGFR has been calculated using the CKD EPI equation. This calculation has not been validated in all clinical situations. eGFR's persistently <60 mL/min signify possible Chronic Kidney Disease.    Anion gap 5 5 - 15  Ethanol     Status: None   Collection Time: 11/19/15  6:08 PM  Result Value Ref Range   Alcohol, Ethyl (B) <5 <5 mg/dL    Comment:        LOWEST DETECTABLE LIMIT FOR SERUM ALCOHOL IS 5 mg/dL FOR MEDICAL PURPOSES ONLY   CBC with Diff     Status: None   Collection Time: 11/19/15  6:08 PM  Result Value Ref Range   WBC 8.1 4.0 - 10.5 K/uL   RBC 5.46  4.22 - 5.81 MIL/uL   Hemoglobin 16.1 13.0 - 17.0 g/dL   HCT 47.3 39.0 - 52.0 %   MCV 86.6 78.0 - 100.0 fL   MCH 29.5 26.0 - 34.0 pg   MCHC 34.0 30.0 - 36.0 g/dL   RDW 14.3 11.5 - 15.5 %   Platelets 162 150 - 400 K/uL   Neutrophils Relative % 70 %   Neutro Abs 5.6 1.7 - 7.7 K/uL   Lymphocytes Relative 20 %   Lymphs Abs 1.6 0.7 - 4.0 K/uL   Monocytes Relative 8 %   Monocytes Absolute 0.7 0.1 - 1.0 K/uL   Eosinophils Relative 2 %   Eosinophils Absolute 0.1 0.0 - 0.7 K/uL   Basophils Relative 0 %   Basophils Absolute 0.0 0.0 - 0.1 K/uL  I-stat troponin, ED     Status: None   Collection Time: 11/19/15  6:19 PM  Result Value Ref Range   Troponin i, poc 0.01 0.00 - 0.08 ng/mL   Comment 3            Comment: Due to the release kinetics of cTnI, a negative result within the first hours of the onset of symptoms does not rule out myocardial infarction with certainty. If myocardial infarction is still suspected, repeat the test at appropriate intervals.   Urine rapid drug screen (hosp performed)not at [facility]     Status: Abnormal   Collection Time: 11/19/15  8:55 PM  Result Value Ref Range   Opiates NONE DETECTED NONE DETECTED   Cocaine POSITIVE (A) NONE DETECTED   Benzodiazepines NONE DETECTED NONE DETECTED   Amphetamines NONE DETECTED NONE DETECTED   Tetrahydrocannabinol POSITIVE (A) NONE DETECTED   Barbiturates NONE DETECTED NONE DETECTED    Comment:        DRUG SCREEN FOR MEDICAL PURPOSES ONLY.  IF CONFIRMATION IS NEEDED FOR ANY PURPOSE, NOTIFY LAB WITHIN 5 DAYS.        LOWEST DETECTABLE LIMITS FOR URINE DRUG SCREEN Drug Class       Cutoff (ng/mL) Amphetamine  1000 Barbiturate      200 Benzodiazepine   433 Tricyclics       295 Opiates          300 Cocaine          300 THC              50    Blood Alcohol level:  Lab Results  Component Value Date   ETH <5 11/19/2015   ETH <5 18/84/1660   Metabolic Disorder Labs:  No results found for: HGBA1C, MPG No results  found for: PROLACTIN Lab Results  Component Value Date   CHOL 155 01/23/2012   TRIG 126 01/23/2012   HDL 36 (L) 01/23/2012   CHOLHDL 4.3 01/23/2012   VLDL 25 01/23/2012   LDLCALC 94 01/23/2012   LDLCALC 94 08/20/2008   Current Medications: Current Facility-Administered Medications  Medication Dose Route Frequency Provider Last Rate Last Dose  . acetaminophen (TYLENOL) tablet 650 mg  650 mg Oral Q6H PRN Nanci Pina, FNP      . alum & mag hydroxide-simeth (MAALOX/MYLANTA) 200-200-20 MG/5ML suspension 30 mL  30 mL Oral Q4H PRN Nanci Pina, FNP      . hydrOXYzine (ATARAX/VISTARIL) tablet 25 mg  25 mg Oral Q6H PRN Kerrie Buffalo, NP   25 mg at 11/20/15 2220  . ibuprofen (ADVIL,MOTRIN) tablet 600 mg  600 mg Oral Q8H PRN Kerrie Buffalo, NP      . lisinopril (PRINIVIL,ZESTRIL) tablet 10 mg  10 mg Oral Daily Nanci Pina, FNP      . loperamide (IMODIUM) capsule 2-4 mg  2-4 mg Oral PRN Kerrie Buffalo, NP      . LORazepam (ATIVAN) tablet 1 mg  1 mg Oral Q6H PRN Kerrie Buffalo, NP      . magnesium hydroxide (MILK OF MAGNESIA) suspension 30 mL  30 mL Oral Daily PRN Nanci Pina, FNP      . multivitamin with minerals tablet 1 tablet  1 tablet Oral Daily Kerrie Buffalo, NP   1 tablet at 11/20/15 2220  . nicotine (NICODERM CQ - dosed in mg/24 hours) patch 21 mg  21 mg Transdermal Daily Kerrie Buffalo, NP      . ondansetron (ZOFRAN-ODT) disintegrating tablet 4 mg  4 mg Oral Q6H PRN Kerrie Buffalo, NP      . rivaroxaban (XARELTO) tablet 20 mg  20 mg Oral Daily Norman Clay, MD      . thiamine (VITAMIN B-1) tablet 100 mg  100 mg Oral Daily Kerrie Buffalo, NP      . traZODone (DESYREL) tablet 50 mg  50 mg Oral QHS PRN,MR X 1 Nanci Pina, FNP   50 mg at 11/20/15 2220   PTA Medications: Prescriptions Prior to Admission  Medication Sig Dispense Refill Last Dose  . FLUoxetine (PROZAC) 20 MG capsule Take 1 capsule (20 mg total) by mouth daily. For depression (Patient not taking: Reported on  11/19/2015) 30 capsule 0 Not Taking at Unknown time  . ibuprofen (ADVIL,MOTRIN) 200 MG tablet Take 800 mg by mouth every 6 (six) hours as needed for moderate pain.   2 weeks  . lisinopril (PRINIVIL,ZESTRIL) 10 MG tablet Take 1 tablet (10 mg total) by mouth daily. For high blood pressure (Patient not taking: Reported on 11/19/2015) 30 tablet 1 Not Taking at Unknown time  . nicotine (NICODERM CQ - DOSED IN MG/24 HOURS) 21 mg/24hr patch Place 1 patch (21 mg total) onto the skin daily. For smoking cessation (Patient not taking:  Reported on 11/19/2015) 28 patch 0 Not Taking at Unknown time  . traZODone (DESYREL) 100 MG tablet Take 1 tablet (100 mg total) by mouth at bedtime as needed for sleep. (Patient not taking: Reported on 11/19/2015) 30 tablet 0 Not Taking at Unknown time  . XARELTO STARTER PACK 15 & 20 MG TBPK Take 15-20 mg by mouth as directed. Take as directed on package: Start with one 51m tablet by mouth twice a day with food. On Day 22, switch to one 23mtablet once a day with food: Blood thinner to prevent blood clot (Patient not taking: Reported on 11/19/2015) 51 each 0 Not Taking at Unknown time   Musculoskeletal: Strength & Muscle Tone: within normal limits Gait & Station: normal Patient leans: N/A  Psychiatric Specialty Exam: Physical Exam  Constitutional: He is oriented to person, place, and time. He appears well-developed.  HENT:  Head: Normocephalic.  Eyes: Pupils are equal, round, and reactive to light.  Neck: Normal range of motion.  Cardiovascular: Normal rate.   Respiratory: Effort normal.  GI: Soft.  Genitourinary:  Genitourinary Comments: Denies any issues in this area  Musculoskeletal: Normal range of motion.  Neurological: He is alert and oriented to person, place, and time.  Skin: Skin is warm and dry.  Psychiatric: His speech is normal and behavior is normal. Thought content normal. His mood appears anxious. Cognition and memory are normal. He exhibits a depressed  mood.    Review of Systems  Constitutional: Positive for malaise/fatigue.  HENT: Negative.   Eyes: Negative.   Respiratory: Negative.   Cardiovascular: Negative.   Gastrointestinal: Negative.   Genitourinary: Negative.   Musculoskeletal: Positive for myalgias.  Skin: Negative.   Neurological: Negative.   Endo/Heme/Allergies: Negative.   Psychiatric/Behavioral: Positive for depression and substance abuse (Hx. Polysubstance dependence). Negative for hallucinations, memory loss and suicidal ideas. The patient is nervous/anxious and has insomnia.     Blood pressure (!) 134/91, pulse (!) 124, temperature 98 F (36.7 C), temperature source Oral, resp. rate 17, height '6\' 5"'  (1.956 m), weight 118.4 kg (261 lb), SpO2 98 %.Body mass index is 30.95 kg/m.  General Appearance: Disheveled, angry  Eye Contact:  Poor  Speech:  Reluctantly, slow  Volume:  kind of an outburst of anger  Mood:  Angry, Depressed and Irritable  Affect:  Constricted and Inappropriate  Thought Process:  Coherent and Descriptions of Associations: Intact  Orientation:  Full (Time, Place, and Person)  Thought Content:  Rumination and denies any hallucinations, delusional thoughts or paranoia.  Suicidal Thoughts:  Denies any thoughts, plans or intent.  Homicidal Thoughts:  Denies any thoughts, plans or intent.  Memory:  Immediate;   Good Recent;   Good Remote;   Good  Judgement:  Fair  Insight:  Lacking  Psychomotor Activity:  Agitated, angry  Concentration:  Concentration: Poor and Attention Span: Poor  Recall:  Good  Fund of Knowledge:  Fair  Language:  Good  Akathisia:  Negative  Handed:  Right  AIMS (if indicated):     Assets:  Communication Skills Desire for Improvement  ADL's:  Intact  Cognition:  WNL  Sleep:  Number of Hours: 5.75   Treatment Plan Summary: Daily contact with patient to assess and evaluate symptoms and progress in treatment and Medication management:1. Admit for crisis management and  stabilization, estimated length of stay 3-5 days.  2. Medication management to reduce current symptoms to base line and improve the patient's overall level of functioning: [facility].  3. Treat health problems as indicated.  4. Develop treatment plan to decrease risk of relapse upon discharge and the need for readmission.  5. Psycho-social education regarding relapse prevention and self care.  6. Health care follow up as needed for medical problems.  7. Review, reconcile, and reinstate any pertinent home medications for other health issues where appropriate. 8. Call for consults with hospitalist for any additional specialty patient care services as needed.  Observation Level/Precautions:  15 minute checks  Laboratory:  PER ED, UDS + for Coaciane & THC  Psychotherapy: Group sessions  Medications: See MAR  Consultations: AS needed   Discharge Concerns: Maintaining sobriety  Estimated LOS: 2-4 days  Other: Admit to [place].   Physician Treatment Plan for Primary Diagnosis: <principal problem not specified>  Long Term Goal(s): Improvement in symptoms so as ready for discharge  Short Term Goals: Ability to identify changes in lifestyle to reduce recurrence of condition will improve, Ability to verbalize feelings will improve, Ability to identify and develop effective coping behaviors will improve and Ability to identify triggers associated with substance abuse/mental health issues will improve  Physician Treatment Plan for Secondary Diagnosis: Active Problems:   Cocaine abuse   MDD (major depressive disorder), severe ([place])  I certify that inpatient services furnished can reasonably be expected to improve the patient's condition.    Encarnacion Slates, NP, PMHNP, FNP-BC 9/2/20179:45 AM

## 2015-11-21 NOTE — Progress Notes (Signed)
Data. Patient denies SI/HI/AVH, and is able to verbally contract for safety on the unit and to come to staff if his thoughts/feelings become overwhelming and prior to acting on thoughts/feelings. Patient spent all day in bed. He refused to get up for meds, meals, groups and even to meet with the provider. Security was called to get patient to get up and go to the meeting with the provider. Patient became verbally threatening and verbally abusive. Food and fluids left at bedside and patient encouraged frequently to take food and fluids. All morning/afternoon  medications refused. Self assessment refused.  Patient did get up late in the shift and take his 1700 medications and go down for dinner.  Action. Emotional support and encouragement offered. Education provided on medications and how they could help him in the present situation. Q 15 minute checks done for safety. Response. Safety on the unit maintained through 15 minute checks.

## 2015-11-22 DIAGNOSIS — F1420 Cocaine dependence, uncomplicated: Secondary | ICD-10-CM

## 2015-11-22 NOTE — [facility] Group Notes (Signed)
[facility] LCSW Group Therapy  11/22/2015 10:00 AM  Type of Therapy:  Group Therapy  Participation Level:  Did Not Attend  [name] 11/22/2015 

## 2015-11-22 NOTE — Progress Notes (Signed)
[facility] MD Progress Note  11/22/2015 2:50 PM [name]  MRN:  [id number]  Subjective: [name] reports, "I'm doing fair".  Objective: [name] is seen, chart reviewed. He is lying down in bed with a towel covering his face. He is not making any eye contact. He is not coming out of his or participating in the group sessions. He refused lunch, saying he was not hungry. He says he will try dinner this evening. He presents with flat/restricted affects. He was started on the Wellbutrin XL yesterday. He is tolerating his medication without any adverse effects. He is being encouraged & supported by staff. He does not appear to be responding to any internal stimuli.  Principal Problem: Cocaine use disorder, severe, dependence (HCC)  Diagnosis:   Patient Active Problem List   Diagnosis Date Noted  . Cocaine use disorder, severe, dependence (HCC) [F14.20] 11/22/2015    Priority: High  . 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 MDD (major depressive disorder), severe (HCC) [F32.2] 11/20/2015  . Cocaine abuse [F14.10] 12/12/2014  . Chest pain [R07.9] 01/23/2012  . Syncope [R55] 01/16/2012  . DVT (deep venous thrombosis) (HCC) [I82.409] 01/16/2012  . Long term (current) use of anticoagulants [Z79.01] 08/16/2011  . TOBACCO USER [F17.200] 12/27/2008  . NONDEPENDENT COCAINE ABUSE EPISODIC [F14.10] 10/01/2008  . 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 Cocaine abuse   . DVT of leg (deep venous thrombosis) (HCC) 01/16/2012   right  . Family history of early CAD    Father deceased at 14 yo with MI  . PE (pulmonary embolism) 2011; 07/2011  . Syncope and collapse 01/16/2012   "woke up to dog licking my face" (01/16/2012)  . Tobacco abuse     Past Surgical History:  Procedure Laterality Date  . NO PAST SURGERIES      Family History:  Family History  Problem Relation Age of Onset  . Coronary artery disease Father 37    MI at age 29  . Colon cancer Paternal Grandmother   . Cancer Maternal Grandfather    Family Psychiatric  History: See H&P  Social History:  History  Alcohol Use  . Yes    Comment: occasion     History  Drug Use  . Types: Cocaine, Marijuana    Social History   Social History  . Marital status: Single    Spouse name: N/A  . Number of children: 0  . Years of education: college   Occupational History  . Musician     plays piano   Social History Main Topics  . Smoking status: Current Every Day Smoker    Packs/day: 0.50    Years: 5.00    Types: Cigarettes  . Smokeless tobacco: Never Used  . Alcohol use Yes     Comment: occasion  . Drug use:     Types: Cocaine, Marijuana  . Sexual activity: Yes   Other Topics Concern  . None   Social History Narrative   Lives in [place] by himself.   Additional Social History:    Pain Medications: none Prescriptions: denies Over the Counter: none History of alcohol / drug use?: Yes Negative Consequences of Use: Legal, Personal relationships, Work / School Withdrawal Symptoms: Sweats, Patient aware of relationship between substance abuse and physical/medical complications Name of Substance 1: crack 1 -  Age of First Use: 21 1 - Amount (size/oz): 3 grams 1 - Frequency: daily 1 - Duration: 2010 1 - Last Use / Amount: 11/18/15-3 grams Name of Substance 2: alcohol 2 - Age of First Use: 20 2 - Amount (size/oz): 12 pk/day 2 - Frequency: daily 2 - Duration: 1 week 2 - Last Use / Amount: 11/18/15    Sleep: Fair  Appetite:  Poor  Current Medications: Current Facility-Administered Medications  Medication Dose Route Frequency Provider Last Rate Last Dose  . acetaminophen (TYLENOL) tablet 650 mg  650 mg Oral Q6H PRN Truman Hayward, FNP   650 mg at 11/21/15 1849  . alum & mag hydroxide-simeth (MAALOX/MYLANTA)  200-200-20 MG/5ML suspension 30 mL  30 mL Oral Q4H PRN Truman Hayward, FNP      . buPROPion (WELLBUTRIN XL) 24 hr tablet 150 mg  150 mg Oral Daily Sanjuana Kava, NP   150 mg at 11/21/15 1733  . hydrOXYzine (ATARAX/VISTARIL) tablet 25 mg  25 mg Oral Q6H PRN Sanjuana Kava, NP   25 mg at 11/21/15 2148  . ibuprofen (ADVIL,MOTRIN) tablet 600 mg  600 mg Oral Q8H PRN Adonis Brook, NP      . lisinopril (PRINIVIL,ZESTRIL) tablet 10 mg  10 mg Oral Daily Truman Hayward, FNP      . loperamide (IMODIUM) capsule 2-4 mg  2-4 mg Oral PRN Adonis Brook, NP      . LORazepam (ATIVAN) tablet 1 mg  1 mg Oral Q6H PRN Adonis Brook, NP   1 mg at 11/21/15 1849  . magnesium hydroxide (MILK OF MAGNESIA) suspension 30 mL  30 mL Oral Daily PRN Truman Hayward, FNP      . multivitamin with minerals tablet 1 tablet  1 tablet Oral Daily Adonis Brook, NP   1 tablet at 11/20/15 2220  . nicotine (NICODERM CQ - dosed in mg/24 hours) patch 21 mg  21 mg Transdermal Daily Adonis Brook, NP      . ondansetron (ZOFRAN-ODT) disintegrating tablet 4 mg  4 mg Oral Q6H PRN Adonis Brook, NP      . rivaroxaban (XARELTO) tablet 20 mg  20 mg Oral Daily Neysa Hotter, MD      . thiamine (VITAMIN B-1) tablet 100 mg  100 mg Oral Daily Adonis Brook, NP      . traZODone (DESYREL) tablet 50 mg  50 mg Oral QHS Sanjuana Kava, NP   50 mg at 11/21/15 2148    Lab Results: No results found for this or any previous visit (from the past 48 hour(s)).  Blood Alcohol level:  Lab Results  Component Value Date   ETH <5 11/19/2015   ETH <5 12/11/2014    Metabolic Disorder Labs: No results found for: HGBA1C, MPG No results found for: PROLACTIN Lab Results  Component Value Date   CHOL 155 01/23/2012   TRIG 126 01/23/2012   HDL 36 (L) 01/23/2012   CHOLHDL 4.3 01/23/2012   VLDL 25 01/23/2012   LDLCALC 94 01/23/2012   LDLCALC 94 08/20/2008    Physical Findings: AIMS: Facial and Oral Movements Muscles of Facial Expression: None,  normal Lips and Perioral Area: None, normal Jaw: None, normal Tongue: None, normal,Extremity Movements Upper (arms, wrists, hands, fingers): None, normal Lower (legs, knees, ankles, toes): None, normal, Trunk Movements Neck, shoulders, hips: None, normal, Overall Severity Severity of abnormal movements (highest score from questions above): None, normal Incapacitation due to abnormal movements: None, normal Patient's awareness of abnormal movements (rate  only patient's report): No Awareness, Dental Status Current problems with teeth and/or dentures?: No Does patient usually wear dentures?: No  CIWA:  CIWA-Ar Total: 0 COWS:     Musculoskeletal: Strength & Muscle Tone: within normal limits Gait & Station: normal Patient leans: N/A  Psychiatric Specialty Exam: Physical Exam  Constitutional: He appears well-developed.  HENT:  Head: Normocephalic.  Eyes: Pupils are equal, round, and reactive to light.  Neck: Normal range of motion.  Cardiovascular: Normal rate.   Respiratory: Effort normal.  GI: Soft.  Musculoskeletal: Normal range of motion.  Neurological: He is alert.    Review of Systems  Constitutional: Positive for malaise/fatigue.  Eyes: Positive for blurred vision.  Respiratory: Negative.   Cardiovascular: Negative.        Hx. Blood clot formation. Is refusing to take his Xarelto  Gastrointestinal: Negative.   Genitourinary: Negative.   Musculoskeletal: Positive for myalgias.  Skin: Negative.   Neurological: Positive for headaches.  Endo/Heme/Allergies: Negative.   Psychiatric/Behavioral: Positive for depression and substance abuse (Hx. Cocaine dependence). Negative for hallucinations, memory loss and suicidal ideas. The patient is nervous/anxious and has insomnia.     Blood pressure 132/80, pulse 80, temperature 98 F (36.7 C), temperature source Oral, resp. rate 18, height 6\' 5"  (1.956 m), weight 118.4 kg (261 lb), SpO2 97 %.Body mass index is 30.95 kg/m.  General  Appearance: Disheveled, angry  Eye Contact:  Poor  Speech:  Reluctantly, slow  Volume:  kind of an outburst of anger  Mood:  Angry, Depressed and Irritable  Affect:  Constricted and Inappropriate  Thought Process:  Coherent and Descriptions of Associations: Intact  Orientation:  Full (Time, Place, and Person)  Thought Content:  Rumination and denies any hallucinations, delusional thoughts or paranoia.  Suicidal Thoughts:  Denies any thoughts, plans or intent.  Homicidal Thoughts:  Denies any thoughts, plans or intent.  Memory:  Immediate;   Good Recent;   Good Remote;   Good  Judgement:  Fair  Insight:  Lacking  Psychomotor Activity:  Agitated, angry  Concentration:  Concentration: Poor and Attention Span: Poor  Recall:  Good  Fund of Knowledge:  Fair  Language:  Good  Akathisia:  Negative  Handed:  Right  AIMS (if indicated):     Assets:  Communication Skills Desire for Improvement  ADL's:  Intact  Cognition:  WNL  Sleep:  Number of Hours: 5.75     Assessment: This is an admission assessment for Harrison, a 40 year old AA male with hx of polysubstance dependence. Has been a patient in this hospital previously due to substance abuse issues as well. He is being admitted from the [facility] ED with complaints of increased drug & alcohol abuse. During this assessment, Xaiden reports very angrily' " Why can't you read my statement from the ED.     Treatment Plan Summary: Daily contact with patient to assess and evaluate symptoms and progress in treatment and Medication management: Depression: Continue Wellbutrin XL 150 mg daily. HTN. Continue Lisinopril 10 mg daily. Insomnia: Continue the Trazodone 50 mg. - Continue 15 minutes observation for safety concerns - Encouraged to participate in milieu therapy and group therapy counseling sessions and also work with coping skills -  Develop treatment plan to decrease risk of relapse upon discharge and to reduce the need for  readmission. -  Psycho-social education regarding relapse prevention and self care. - Health care follow up as needed for medical problems. - Restart home medications where appropriate.  Gayatri Teasdale,  Nelda MarseilleAgnes I, NP, PMHNP, FNP-BC 11/22/2015, 2:50 PM

## 2015-11-22 NOTE — BHH Counselor (Signed)
Adult Comprehensive Assessment  Patient ID: [name], male   DOB: [birth year], 40 y.o.   MRN: [id number]  Information Source: Information source: Patient  Current Stressors:  Employment / Job issues: Working but need more money Family Relationships: Stress around providing for family Financial / Lack of resources (include bankruptcy): Need more money Housing / Lack of housing: Looking for a house Substance abuse: Relapse Bereavement / Loss: Mother passed July 5th and connected to relapse  Living/Environment/Situation:  Living Arrangements: Spouse/significant other Living conditions (as described by patient or guardian): Living with wife, kids stay sometimes How long has patient lived in current situation?: 1 year What is atmosphere in current home:  (Good)  Family History:  Marital status: Married Number of Years Married: 1.5 What types of issues is patient dealing with in the relationship?: Dealing with addiction Does patient have children?: Yes How many children?: 3 (Wife has 3 daughters and they have 8 grandchildren) How is patient's relationship with their children?: Good relationship with kids and grandkids  Childhood History:  By whom was/is the patient raised?: Grandparents Additional childhood history information: grandmother raised him because parents were biracial, parents couldn't be together because they were amixed couple  Description of patient's relationship with caregiver when they were a child: good Patient's description of current relationship with people who raised him/her: grandmother passed on Does patient have siblings?: Yes Number of Siblings: 3 Description of patient's current relationship with siblings: Not raised together, they are white Did patient suffer any verbal/emotional/physical/sexual abuse as a child?: No Did patient suffer from severe childhood neglect?: No Has patient ever been sexually abused/assaulted/raped as an adolescent or adult?:  No Was the patient ever a victim of a crime or a disaster?: No Witnessed domestic violence?: No Has patient been effected by domestic violence as an adult?: No  Education:  Highest grade of school patient has completed: Some college Currently a Consulting civil engineer?: No Learning disability?: No  Employment/Work Situation:   Employment situation: Employed 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 [employer] long has patient been employed?: 3 years Patient's job has been impacted by current illness: Yes Describe how patient's job has been impacted: Missed work while using Has patient ever been in the [employer]?: No Did You Receive Any Psychiatric Treatment/Services While in Equities trader?: No Are There Guns or Other Weapons in Your Home?: No  Financial Resources:   Financial resources: Income from employment Does patient have a representative payee or guardian?: No  Alcohol/Substance Abuse:   What has been your use of drugs/alcohol within the last 12 months?: Cocaine, crack alcohol and weed If attempted suicide, did drugs/alcohol play a role in this?: No Alcohol/Substance Abuse Treatment Hx: Past Tx, Outpatient, Past detox, Past Tx, Inpatient If yes, describe treatment: Last time here went to [facility] Has alcohol/substance abuse ever caused legal problems?: No  Social Support System:   Patient's Community Support System: Good Describe Community Support System: It's good Type of faith/religion: Ephriam Knuckles How does patient's faith help to cope with current illness?: yes  Leisure/Recreation:   Leisure and Hobbies: Be with grandchildren  Strengths/Needs:   What things does the patient do well?: Play music In what areas does patient struggle / problems for patient: Addiction  Discharge Plan:   Does patient have access to transportation?: Yes Will patient be returning to same living situation after discharge?:  (Not sure at this time. Not spoken with wife since  he has been here) Currently receiving community  mental health services: No If no, would patient like referral for services when discharged?: Yes (What county?) [facility]([place]) Does patient have financial barriers related to discharge medications?: Yes Patient description of barriers related to discharge medications: No insurance.   Summary/Recommendations:   Summary and Recommendations (to be completed by the evaluator): Patient is a 40 year old male who presented to the hospital seeking detox from substances. Patient reports primary triggers for admission was relapse about 1 month ago when his mother passed away. Patient will benefit from crisis stabilization medication evaluation, group therapy and psychoeducation in addition to case management for discharge planning. At discharge, it is recommended that patient remain compliant with established discharge plan and continued treatment.  Beverly Sessionsywan J Tish Begin. 11/22/2015

## 2015-11-22 NOTE — Progress Notes (Signed)
D: Patient reported feeling better because some friends came visiting him today. Patient stated "my feelings changed to better when my visitors came. I am glad they did come visit me". Denies pain, SI, AH/VH at this time. Endorses mild depression. No behavioral issues noted.  A: Staff offered support and encouragement. Due meds given as ordered. Every  15 minutes check for safety maintained. Will continue to monitor patient fir safety and stability.  R: Patient remains safe.

## 2015-11-22 NOTE — Progress Notes (Signed)
D: Pt presents with flat affect and depressed mood. Pt irritable on approach and easily agitated. Pt uncooperative during shift assessment. Pt refused to take morning meds. Pt stated that he doesn't take Lisinopril or Xarelto at home and will not take it here. Writer asked pt about hx of blood clot. Pt confirmed hx but denied taking any meds at home. Per pt, "I only take meds for anxiety and sleep". Writer notified Nicole KindredAgnes, NP., of pt refusal. Pt isolates in his room and have not attended groups and refused lunch. Pt appears disheveled with poor hygiene.  A: Medications reviewed with pt. Medications offered to pt. Verbal support provided. Pt encouraged to notify writer if he needs anything. Pt contracts for [employer] with writer. Pt encouraged to attend groups for support and coping skills. 15 minute checks performed for safety.  R: Pt non receptive to tx.

## 2015-11-22 NOTE — BHH Group Notes (Signed)
Nursing psychoeducational group focussed on mindfulness meditation/body scanning technique with guided meditation.   Pt did not attend. 

## 2015-11-23 NOTE — Progress Notes (Signed)
Recreation Therapy Notes  Date: 11/23/15 Time: 0930 Location: [address] Group Room  Group Topic: Stress Management  Goal Area(s) Addresses:  Patient will verbalize importance of using healthy stress management.  Patient will identify positive emotions associated with healthy stress management.   Intervention: Stress Management  Activity :  Guided Imagery.  LRT introduced the stress management technique of guided imagery to the patients.  LRT read a script so patients could participate in the activity.  Patients were to follow along as LRT read script to participate.  Education:  Stress Management, Discharge Planning.   Education Outcome: Needs additional education  Clinical Observations/Feedback: Pt did not attend group.   Caroll RancherMarjette Takeem Krotzer, LRT/CTRS         Caroll RancherLindsay, Korine Winton A 11/23/2015 12:24 PM

## 2015-11-23 NOTE — Progress Notes (Signed)
D: Patient denies SI/HI and A/V hallucinations; patient reports sleep is poor; reports appetite is fair ; reports energy level is normal ; reports ability to concentrate is poor; rates depression as 10/10; rates hopelessness 10/10; rates anxiety as 10/10;   A: Monitored q 15 minutes; patient encouraged to attend groups; patient educated about medications; patient given medications per physician orders; patient encouraged to express feelings and/or concerns  R: Patient is flat and blunted; patient does not engage; patient refused all of his medications; patient is not attending groups; patient's interaction with staff and peers is isolative; patient was able to set goal to talk with staff 1:1 when having feelings of SI

## 2015-11-23 NOTE — Progress Notes (Signed)
[name]. [name] had been up and visible in milieu this evening, did attend evening group activity. 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 [name] complained of on-going anxiety and racing thoughts as well as feelings of agitation this evening. [name] did ask for and receive medications without incident and did not verbalize any complaints of pain. A. Support and encouragement provided. R. Safety maintained, will continue to monitor.

## 2015-11-23 NOTE — Progress Notes (Signed)
Patient did attend the evening speaker AA meeting.  

## 2015-11-23 NOTE — Progress Notes (Signed)
Patient ID: Smith RobertCarl W Cull, male   DOB: 02/22/1976, 40 y.o.   MRN: [id number] [facility]  11/23/2015 2:32 PM Smith RobertCarl W Episcopo  MRN:  [id number]  Subjective: Baldo Asharl reports, "I did not sleep well last night"  Objective: Baldo AshCarl is seen, chart reviewed. He is lying down in bed with a towel covering his face. He is not making any eye contact. He is not coming out of his or participating in the group sessions. He only gets up just to eat. He presents with flat/restricted affects. He was started on the Wellbutrin XL. Staff reports that he is refusing medications. He is being encouraged & supported by staff. He does not appear to be responding to any internal stimuli.  Principal Problem: Cocaine use disorder, severe, dependence (HCC)  Diagnosis:   Patient Active Problem List   Diagnosis Date Noted  . Cocaine use disorder, severe, dependence (HCC) [F14.20] 11/22/2015    Priority: High  . 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 MDD (major depressive disorder), severe (HCC) [F32.2] 11/20/2015  . Cocaine abuse [F14.10] 12/12/2014  . Chest pain [R07.9] 01/23/2012  . Syncope [R55] 01/16/2012  . DVT (deep venous thrombosis) (HCC) [I82.409] 01/16/2012  . Long term (current) use of anticoagulants [Z79.01] 08/16/2011  . TOBACCO USER [F17.200] 12/27/2008  . NONDEPENDENT COCAINE ABUSE EPISODIC [F14.10] 10/01/2008  . 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 Cocaine abuse   . DVT of leg (deep venous thrombosis) (HCC) 01/16/2012   right  . Family history of early CAD    Father deceased at 38 yo with MI  . PE (pulmonary embolism) 2011; 07/2011  . Syncope and collapse 01/16/2012   "woke up to dog licking my face" (01/16/2012)  . Tobacco abuse     Past Surgical History:  Procedure Laterality Date  . NO PAST  SURGERIES     Family History:  Family History  Problem Relation Age of Onset  . Coronary artery disease Father 8636    MI at age 38  . Colon cancer Paternal Grandmother   . Cancer Maternal Grandfather    Family Psychiatric  History: See H&P  Social History:  History  Alcohol Use  . Yes    Comment: occasion     History  Drug Use  . Types: Cocaine, Marijuana    Social History   Social History  . Marital status: Single    Spouse name: N/A  . Number of children: 0  . Years of education: college   Occupational History  . Musician     plays piano   Social History Main Topics  . Smoking status: Current Every Day Smoker    Packs/day: 0.50    Years: 5.00    Types: Cigarettes  . Smokeless tobacco: Never Used  . Alcohol use Yes     Comment: occasion  . Drug use:     Types: Cocaine, Marijuana  . Sexual activity: Yes   Other Topics Concern  . None   Social History Narrative   [place] in [place] by himself.   Additional Social History:    Pain Medications: none Prescriptions: denies Over the Counter: none History of alcohol / drug use?: Yes Negative Consequences of Use: Legal, Personal relationships, Work / School Withdrawal Symptoms: Sweats, Patient aware of relationship between substance abuse and  physical/medical complications Name of Substance 1: crack 1 - Age of First Use: 21 1 - Amount (size/oz): 3 grams 1 - Frequency: daily 1 - Duration: 2010 1 - Last Use / Amount: 11/18/15-3 grams Name of Substance 2: alcohol 2 - Age of First Use: 20 2 - Amount (size/oz): 12 pk/day 2 - Frequency: daily 2 - Duration: 1 week 2 - Last Use / Amount: 11/18/15    Sleep: Fair  Appetite:  Poor  Current Medications: Current Facility-Administered Medications  Medication Dose Route Frequency Provider Last Rate Last Dose  . acetaminophen (TYLENOL) tablet 650 mg  650 mg Oral Q6H PRN Truman Hayward, FNP   650 mg at 11/21/15 1849  . alum & mag hydroxide-simeth  (MAALOX/MYLANTA) 200-200-20 MG/5ML suspension 30 mL  30 mL Oral Q4H PRN Truman Hayward, FNP      . buPROPion (WELLBUTRIN XL) 24 hr tablet 150 mg  150 mg Oral Daily Sanjuana Kava, NP   150 mg at 11/21/15 1733  . hydrOXYzine (ATARAX/VISTARIL) tablet 25 mg  25 mg Oral Q6H PRN Sanjuana Kava, NP   25 mg at 11/22/15 2227  . ibuprofen (ADVIL,MOTRIN) tablet 600 mg  600 mg Oral Q8H PRN Adonis Brook, NP      . lisinopril (PRINIVIL,ZESTRIL) tablet 10 mg  10 mg Oral Daily Truman Hayward, FNP      . loperamide (IMODIUM) capsule 2-4 mg  2-4 mg Oral PRN Adonis Brook, NP      . LORazepam (ATIVAN) tablet 1 mg  1 mg Oral Q6H PRN Adonis Brook, NP   1 mg at 11/22/15 2340  . magnesium hydroxide (MILK OF MAGNESIA) suspension 30 mL  30 mL Oral Daily PRN Truman Hayward, FNP      . multivitamin with minerals tablet 1 tablet  1 tablet Oral Daily Adonis Brook, NP   1 tablet at 11/20/15 2220  . nicotine (NICODERM CQ - dosed in mg/24 hours) patch 21 mg  21 mg Transdermal Daily Adonis Brook, NP      . ondansetron (ZOFRAN-ODT) disintegrating tablet 4 mg  4 mg Oral Q6H PRN Adonis Brook, NP      . rivaroxaban (XARELTO) tablet 20 mg  20 mg Oral Daily Neysa Hotter, MD      . thiamine (VITAMIN B-1) tablet 100 mg  100 mg Oral Daily Adonis Brook, NP      . traZODone (DESYREL) tablet 50 mg  50 mg Oral QHS Sanjuana Kava, NP   50 mg at 11/22/15 2227   Lab Results: No results found for this or any previous visit (from the past 48 hour(s)).  Blood Alcohol level:  Lab Results  Component Value Date   ETH <5 11/19/2015   ETH <5 12/11/2014   Metabolic Disorder Labs: No results found for: HGBA1C, MPG No results found for: PROLACTIN Lab Results  Component Value Date   CHOL 155 01/23/2012   TRIG 126 01/23/2012   HDL 36 (L) 01/23/2012   CHOLHDL 4.3 01/23/2012   VLDL 25 01/23/2012   LDLCALC 94 01/23/2012   LDLCALC 94 08/20/2008   Physical Findings: AIMS: Facial and Oral Movements Muscles of Facial Expression:  None, normal Lips and Perioral Area: None, normal Jaw: None, normal Tongue: None, normal,Extremity Movements Upper (arms, wrists, hands, fingers): None, normal Lower (legs, knees, ankles, toes): None, normal, Trunk Movements Neck, shoulders, hips: None, normal, Overall Severity Severity of abnormal movements (highest score from questions above): None, normal Incapacitation due to abnormal movements: None, normal  Patient's awareness of abnormal movements (rate only patient's report): No Awareness, Dental Status Current problems with teeth and/or dentures?: No Does patient usually wear dentures?: No  CIWA:  CIWA-Ar Total: 10 COWS:     Musculoskeletal: Strength & Muscle Tone: within normal limits Gait & Station: normal Patient leans: N/A  Psychiatric Specialty Exam: Physical Exam  Constitutional: He appears well-developed.  HENT:  Head: Normocephalic.  Eyes: Pupils are equal, round, and reactive to light.  Neck: Normal range of motion.  Cardiovascular: Normal rate.   Respiratory: Effort normal.  GI: Soft.  Musculoskeletal: Normal range of motion.  Neurological: He is alert.    Review of Systems  Constitutional: Positive for malaise/fatigue.  Eyes: Positive for blurred vision.  Respiratory: Negative.   Cardiovascular: Negative.        Hx. Blood clot formation. Is refusing to take his Xarelto  Gastrointestinal: Negative.   Genitourinary: Negative.   Musculoskeletal: Positive for myalgias.  Skin: Negative.   Neurological: Positive for headaches.  Endo/Heme/Allergies: Negative.   Psychiatric/Behavioral: Positive for depression and substance abuse (Hx. Cocaine dependence). Negative for hallucinations, memory loss and suicidal ideas. The patient is nervous/anxious and has insomnia.     Blood pressure (!) 123/95, pulse (!) 108, temperature 99 F (37.2 C), temperature source Oral, resp. rate 16, height 6\' 5"  (1.956 m), weight 118.4 kg (261 lb), SpO2 97 %.Body mass index is 30.95  kg/m.  General Appearance: Disheveled, angry  Eye Contact:  Poor  Speech:  Reluctantly, slow  Volume:  kind of an outburst of anger  Mood:  Angry, Depressed and Irritable  Affect:  Constricted and Inappropriate  Thought Process:  Coherent and Descriptions of Associations: Intact  Orientation:  Full (Time, Place, and Person)  Thought Content:  Rumination and denies any hallucinations, delusional thoughts or paranoia.  Suicidal Thoughts:  Denies any thoughts, plans or intent.  Homicidal Thoughts:  Denies any thoughts, plans or intent.  Memory:  Immediate;   Good Recent;   Good Remote;   Good  Judgement:  Fair  Insight:  Lacking  Psychomotor Activity:  Agitated, angry  Concentration:  Concentration: Poor and Attention Span: Poor  Recall:  Good  Fund of Knowledge:  Fair  Language:  Good  Akathisia:  Negative  Handed:  Right  AIMS (if indicated):     Assets:  Communication Skills Desire for Improvement  ADL's:  Intact  Cognition:  WNL  Sleep:  Number of Hours: 5.75     Assessment: This is an admission assessment for Kong, a 40 year old AA male with hx of polysubstance dependence. Has been a patient in this hospital previously due to substance abuse issues as well. He is being admitted from the [facility] ED with complaints of increased drug & alcohol abuse. During this assessment, Chrystian reports very angrily' " Why can't you read my statement from the ED.    Treatment Plan Summary: Daily contact with patient to assess and evaluate symptoms and progress in treatment and Medication management: Depression: Continue Wellbutrin XL 150 mg daily. HTN. Continue Lisinopril 10 mg daily. Insomnia: Continue the Trazodone 50 mg. - Continue 15 minutes observation for safety concerns - Encouraged to participate in milieu therapy and group therapy counseling sessions and also work with coping skills -  Develop treatment plan to decrease risk of relapse upon discharge and to reduce the  need for readmission. -  Psycho-social education regarding relapse prevention and self care. - Health care follow up as needed for medical problems. -  Restart home medications where appropriate. -  Continue current plan of care.  Sanjuana Kava, NP, PMHNP, FNP-BC 11/23/2015, 2:32 PM

## 2015-11-24 ENCOUNTER — Encounter (HOSPITAL_COMMUNITY): Payer: Self-pay | Admitting: Psychiatry

## 2015-11-24 DIAGNOSIS — F332 Major depressive disorder, recurrent severe without psychotic features: Principal | ICD-10-CM

## 2015-11-24 DIAGNOSIS — F1220 Cannabis dependence, uncomplicated: Secondary | ICD-10-CM | POA: Diagnosis present

## 2015-11-24 NOTE — Progress Notes (Signed)
Patient ID: [name], male   DOB: Apr 08, 1975, 40 y.o.   MRN: [id number] [facility] MD Progress Note  11/24/2015 11:15 AM [name]  MRN:  [id number]  Subjective: [name] reports, "I am fine."   Objective: [name] is seen, chart reviewed. He is lying down in bed with a towel covering his face. He was able to talk to writer for a brief amount of time. Pt reports his is less depressed and anxious today . He is tolerating his medications well. Per staff - pt remains withdrawn mostly , denies any ADRs , continues to need encouragement and support.  Principal Problem: MDD (major depressive disorder), recurrent severe, without psychosis (HCC)  Diagnosis:   Patient Active Problem List   Diagnosis Date Noted  . Cannabis use disorder, moderate, dependence (HCC) [F12.20] 11/24/2015  . MDD (major depressive disorder), recurrent severe, without psychosis (HCC) [F33.2] 11/24/2015  . Cocaine use disorder, severe, dependence (HCC) [F14.20] 11/22/2015  . Chest pain [R07.9] 01/23/2012  . Syncope [R55] 01/16/2012  . DVT (deep venous thrombosis) (HCC) [I82.409] 01/16/2012  . Long term (current) use of anticoagulants [Z79.01] 08/16/2011  . TOBACCO USER [F17.200] 12/27/2008  . NONDEPENDENT COCAINE ABUSE EPISODIC [F14.10] 10/01/2008  . 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 Cocaine abuse   . DVT of leg (deep venous thrombosis) (HCC) 01/16/2012   right  . Family history of early CAD    Father deceased at 33 yo with MI  . PE (pulmonary embolism) 2011; 07/2011  . Syncope and collapse 01/16/2012   "woke up to dog licking my face" (01/16/2012)  . Tobacco abuse     Past Surgical History:  Procedure Laterality Date  . NO PAST SURGERIES     Family History:  Family History  Problem Relation Age of Onset  . Coronary artery disease Father 35    MI at  age 32  . Colon cancer Paternal Grandmother   . Cancer Maternal Grandfather    Family Psychiatric  History: See H&P  Social History:  History  Alcohol Use  . Yes    Comment: occasion     History  Drug Use  . Types: Cocaine, Marijuana    Social History   Social History  . Marital status: Single    Spouse name: N/A  . Number of children: 0  . Years of education: college   Occupational History  . Musician     plays piano   Social History Main Topics  . Smoking status: Current Every Day Smoker    Packs/day: 0.50    Years: 5.00    Types: Cigarettes  . Smokeless tobacco: Never Used  . Alcohol use Yes     Comment: occasion  . Drug use:     Types: Cocaine, Marijuana  . Sexual activity: Yes   Other Topics Concern  . None   Social History Narrative   Lives in [place] by himself.   Additional Social History:    Pain Medications: none Prescriptions: denies Over the Counter: none History of alcohol / drug use?: Yes Negative Consequences of Use: Legal, Personal relationships, Work / School Withdrawal Symptoms: Sweats, Patient aware of relationship between substance abuse and physical/medical complications Name of Substance 1: crack 1 - Age of First Use: 21 1 - Amount (size/oz): 3 grams 1 - Frequency: daily 1 - Duration: 2010 1 -  Last Use / Amount: 11/18/15-3 grams Name of Substance 2: alcohol 2 - Age of First Use: 20 2 - Amount (size/oz): 12 pk/day 2 - Frequency: daily 2 - Duration: 1 week 2 - Last Use / Amount: 11/18/15    Sleep: Fair  Appetite:  Poor improving  Current Medications: Current Facility-Administered Medications  Medication Dose Route Frequency Provider Last Rate Last Dose  . acetaminophen (TYLENOL) tablet 650 mg  650 mg Oral Q6H PRN Truman Haywardakia S Starkes, FNP   650 mg at 11/21/15 1849  . alum & mag hydroxide-simeth (MAALOX/MYLANTA) 200-200-20 MG/5ML suspension 30 mL  30 mL Oral Q4H PRN Truman Haywardakia S Starkes, FNP      . buPROPion (WELLBUTRIN XL) 24  hr tablet 150 mg  150 mg Oral Daily Sanjuana KavaAgnes I Nwoko, NP   150 mg at 11/21/15 1733  . ibuprofen (ADVIL,MOTRIN) tablet 600 mg  600 mg Oral Q8H PRN Adonis BrookSheila Agustin, NP      . lisinopril (PRINIVIL,ZESTRIL) tablet 10 mg  10 mg Oral Daily Truman Haywardakia S Starkes, FNP      . magnesium hydroxide (MILK OF MAGNESIA) suspension 30 mL  30 mL Oral Daily PRN Truman Haywardakia S Starkes, FNP      . multivitamin with minerals tablet 1 tablet  1 tablet Oral Daily Adonis BrookSheila Agustin, NP   1 tablet at 11/20/15 2220  . nicotine (NICODERM CQ - dosed in mg/24 hours) patch 21 mg  21 mg Transdermal Daily Adonis BrookSheila Agustin, NP      . rivaroxaban (XARELTO) tablet 20 mg  20 mg Oral Daily Neysa Hottereina Hisada, MD      . thiamine (VITAMIN B-1) tablet 100 mg  100 mg Oral Daily Adonis BrookSheila Agustin, NP      . traZODone (DESYREL) tablet 50 mg  50 mg Oral QHS Sanjuana KavaAgnes I Nwoko, NP   50 mg at 11/23/15 2337   Lab Results: No results found for this or any previous visit (from the past 48 hour(s)).  Blood Alcohol level:  Lab Results  Component Value Date   ETH <5 11/19/2015   ETH <5 12/11/2014   Metabolic Disorder Labs: No results found for: HGBA1C, MPG No results found for: PROLACTIN Lab Results  Component Value Date   CHOL 155 01/23/2012   TRIG 126 01/23/2012   HDL 36 (L) 01/23/2012   CHOLHDL 4.3 01/23/2012   VLDL 25 01/23/2012   LDLCALC 94 01/23/2012   LDLCALC 94 08/20/2008   Physical Findings: AIMS: Facial and Oral Movements Muscles of Facial Expression: None, normal Lips and Perioral Area: None, normal Jaw: None, normal Tongue: None, normal,Extremity Movements Upper (arms, wrists, hands, fingers): None, normal Lower (legs, knees, ankles, toes): None, normal, Trunk Movements Neck, shoulders, hips: None, normal, Overall Severity Severity of abnormal movements (highest score from questions above): None, normal Incapacitation due to abnormal movements: None, normal Patient's awareness of abnormal movements (rate only patient's report): No Awareness, Dental  Status Current problems with teeth and/or dentures?: No Does patient usually wear dentures?: No  CIWA:  CIWA-Ar Total: 1 COWS:     Musculoskeletal: Strength & Muscle Tone: within normal limits Gait & Station: normal Patient leans: N/A  Psychiatric Specialty Exam: Physical Exam  Nursing note and vitals reviewed.   Review of Systems  Respiratory: Negative.   Cardiovascular: Negative.        Hx. Blood clot formation. Is refusing to take his Xarelto  Gastrointestinal: Negative.   Genitourinary: Negative.   Musculoskeletal: Positive for myalgias.  Skin: Negative.   Endo/Heme/Allergies: Negative.   Psychiatric/Behavioral:  Positive for depression and substance abuse (Hx. Cocaine dependence). Negative for hallucinations, memory loss and suicidal ideas. The patient is nervous/anxious.   All other systems reviewed and are negative.   Blood pressure (!) 138/97, pulse 86, temperature 98.4 F (36.9 C), temperature source Oral, resp. rate 18, height 6\' 5"  (1.956 m), weight 118.4 kg (261 lb), SpO2 97 %.Body mass index is 30.95 kg/m.  General Appearance:  guarded  Eye Contact:  Poor  Speech:  Reluctantly, slow  Volume: normal  Mood:  Angry, Depressed and Irritable improving   Affect:  Constricted and Inappropriate  Thought Process:  Coherent and Descriptions of Associations: Intact  Orientation:  Full (Time, Place, and Person)  Thought Content:  Rumination and denies any hallucinations, delusional thoughts or paranoia.  Suicidal Thoughts:  Denies any thoughts, plans or intent.  Homicidal Thoughts:  Denies any thoughts, plans or intent.  Memory:  Immediate;   Good Recent;   Good Remote;   Good  Judgement:  Fair  Insight:  Lacking  Psychomotor Activity:  Agitated, angry improving  Concentration:  Concentration: Poor and Attention Span: Poor  Recall:  Good  Fund of Knowledge:  Fair  Language:  Good  Akathisia:  Negative  Handed:  Right  AIMS (if indicated):     Assets:   Communication Skills Desire for Improvement  ADL's:  Intact  Cognition:  WNL  Sleep:  Number of Hours: 6 hrs     Assessment: [name], a 40 year old AA male with hx of polysubstance dependence. Has been a patient in this hospital previously due to substance abuse issues as well. He is being admitted from the [facility] ED with complaints of increased drug & alcohol abuse. [name] is making some progress , although remains withdrawn mostly - will continue treatment.  Treatment Plan Summary: Daily contact with patient to assess and evaluate symptoms and progress in treatment and Medication management: Depression: Continue Wellbutrin XL 150 mg daily. HTN. Continue Lisinopril 10 mg daily. Insomnia: Continue the Trazodone 50 mg. - Continue 15 minutes observation for safety concerns - Encouraged to participate in milieu therapy and group therapy counseling sessions and also work with coping skills -  Develop treatment plan to decrease risk of relapse upon discharge and to reduce the need for readmission. -  Psycho-social education regarding relapse prevention and self care. - Health care follow up as needed for medical problems. - Restart home medications where appropriate. -  Continue current plan of care.  Isabel Freese, MD 11/24/2015, 11:15 AM

## 2015-11-24 NOTE — Progress Notes (Signed)
[name]. Lamarkus had been up and visible in milieu this evening, did endorse anxiety and depression and has appeared depressed and withdrawn in the milieu this evening. Baldo AshCarl reports that the medicine he took last night did not help him with sleep and initially did not want to take it but did eventually come to get his medicine as he reported difficulty with sleep. Baldo AshCarl did not verbalize any complaints of pain this evening. A. Support and encouragement provided. R. Safety maintained, will continue to monitor.

## 2015-11-24 NOTE — Progress Notes (Signed)
D: Pt continues to be very flat and depressed on the unit today. Pt also continues to be very isolative and stayed in the bed the whole morning. Pt reported this morning that he was ready for discharge and wanted to talk to Child psychotherapistsocial worker. Pt reported that he he had a good night, good appetite, normal energy level and good concentration, states depression was a 0, his hopelessness was a 0, and that his anxiety was a 0. Pt reported being negative SI/HI, no AH/VH noted. A: 15 min checks continued for patient safety. R: Pts safety maintained.

## 2015-11-24 NOTE — [facility] Group Notes (Signed)
[facility] LCSW Group Therapy 11/24/2015  1:15 PM   Type of Therapy: Group Therapy  Participation Level: Did Not Attend. Patient invited to participate but declined.   Zaxton Angerer, MSW, LCSW Clinical Social Worker [facility] [phone]    

## 2015-11-24 NOTE — [facility] Suicide Risk Assessment (Signed)
[facility] INPATIENT:  Family/Significant Other Suicide Prevention Education  Suicide Prevention Education:  Patient Refusal for Family/Significant Other Suicide Prevention Education: The patient [name] has refused to provide written consent for family/significant other to be provided Family/Significant Other Suicide Prevention Education during admission and/or prior to discharge.  Physician notified. SPE reviewed with patient and brochure provided. Patient encouraged to return to hospital if having suicidal thoughts, patient verbalized his/her understanding and has no further questions at this time.   Mareli Antunes L Burnette Sautter 11/24/2015, 3:41 PM

## 2015-11-24 NOTE — Progress Notes (Signed)
Adult Psychoeducational Group Note  Date:  11/24/2015 Time:  9:36 PM  Group Topic/Focus:  Wrap-Up Group:   The focus of this group is to help patients review their daily goal of treatment and discuss progress on daily workbooks.   Participation Level:  Did Not Attend  Participation Quality:  Did not attend  Affect:  Did not attend  Cognitive:  Did not attend  Insight: None  Engagement in Group:  Did not attend  Modes of Intervention:  Did not attend   Additional Comments:  Patient did not attend wrap up group tonight.  Ethanjames Fontenot L Jsean Taussig 11/24/2015, 9:36 PM

## 2015-11-24 NOTE — Progress Notes (Signed)
D: Pt was in the day room upon initial approach.  He reports his day was "all right."  Pt reports his goal is "just going home."  He reports he feels safe to discharge soon.  Pt has appropriate affect and depressed mood.  Pt denies SI/HI, denies hallucinations, denies pain, denies withdrawal symptoms.  Pt has been visible in milieu interacting with peers and staff appropriately.  A: Introduced self to pt.  Met with pt and offered support and encouragement.  Medication offered per order.  Medication education provided.   R: Pt refused scheduled HS medication.  Pt verbally contracts for safety.  Will continue to monitor and assess.

## 2015-11-24 NOTE — Progress Notes (Signed)
Recreation Therapy Notes  Animal-Assisted Activity (AAA) Program Checklist/Progress Notes Patient Eligibility Criteria Checklist & Daily Group note for Rec TxIntervention  Date: 09.05.2017 Time: 2:45pm Location: [address] Dayroom    AAA/T Program Assumption of Risk Form signed by Patient/ or Parent Legal Guardian Yes  Patient is free of allergies or sever asthma Yes  Patient reports no fear of animals Yes  Patient reports no history of cruelty to animals Yes  Patient understands his/her participation is voluntary Yes  Behavioral Response: Did not attend.  Sebastian Lurz L Najib Colmenares, LRT/CTRS  Shellene Sweigert L 11/24/2015 2:59 PM 

## 2015-11-24 NOTE — Tx Team (Signed)
Interdisciplinary Treatment and Diagnostic Plan Update  11/24/2015 Time of Session: 9:30am MARQUIZ SOTELO MRN: [id number]  Principal Diagnosis: Cocaine use disorder, severe, dependence (HCC)  Secondary Diagnoses: Principal Problem:   Cocaine use disorder, severe, dependence (HCC) Active Problems:   Cocaine abuse   Cocaine-induced mood disorder (HCC)   MDD (major depressive disorder), severe (HCC)   Current Medications:  Current Facility-Administered Medications  Medication Dose Route Frequency Provider Last Rate Last Dose  . acetaminophen (TYLENOL) tablet 650 mg  650 mg Oral Q6H PRN Truman Hayward, FNP   650 mg at 11/21/15 1849  . alum & mag hydroxide-simeth (MAALOX/MYLANTA) 200-200-20 MG/5ML suspension 30 mL  30 mL Oral Q4H PRN Truman Hayward, FNP      . buPROPion (WELLBUTRIN XL) 24 hr tablet 150 mg  150 mg Oral Daily Sanjuana Kava, NP   150 mg at 11/21/15 1733  . ibuprofen (ADVIL,MOTRIN) tablet 600 mg  600 mg Oral Q8H PRN Adonis Brook, NP      . lisinopril (PRINIVIL,ZESTRIL) tablet 10 mg  10 mg Oral Daily Truman Hayward, FNP      . magnesium hydroxide (MILK OF MAGNESIA) suspension 30 mL  30 mL Oral Daily PRN Truman Hayward, FNP      . multivitamin with minerals tablet 1 tablet  1 tablet Oral Daily Adonis Brook, NP   1 tablet at 11/20/15 2220  . nicotine (NICODERM CQ - dosed in mg/24 hours) patch 21 mg  21 mg Transdermal Daily Adonis Brook, NP      . rivaroxaban (XARELTO) tablet 20 mg  20 mg Oral Daily Neysa Hotter, MD      . thiamine (VITAMIN B-1) tablet 100 mg  100 mg Oral Daily Adonis Brook, NP      . traZODone (DESYREL) tablet 50 mg  50 mg Oral QHS Sanjuana Kava, NP   50 mg at 11/23/15 2337   PTA Medications: Prescriptions Prior to Admission  Medication Sig Dispense Refill Last Dose  . FLUoxetine (PROZAC) 20 MG capsule Take 1 capsule (20 mg total) by mouth daily. For depression (Patient not taking: Reported on 11/19/2015) 30 capsule 0 Not Taking at Unknown time  .  ibuprofen (ADVIL,MOTRIN) 200 MG tablet Take 800 mg by mouth every 6 (six) hours as needed for moderate pain.   2 weeks  . lisinopril (PRINIVIL,ZESTRIL) 10 MG tablet Take 1 tablet (10 mg total) by mouth daily. For high blood pressure (Patient not taking: Reported on 11/19/2015) 30 tablet 1 Not Taking at Unknown time  . nicotine (NICODERM CQ - DOSED IN MG/24 HOURS) 21 mg/24hr patch Place 1 patch (21 mg total) onto the skin daily. For smoking cessation (Patient not taking: Reported on 11/19/2015) 28 patch 0 Not Taking at Unknown time  . traZODone (DESYREL) 100 MG tablet Take 1 tablet (100 mg total) by mouth at bedtime as needed for sleep. (Patient not taking: Reported on 11/19/2015) 30 tablet 0 Not Taking at Unknown time  . XARELTO STARTER PACK 15 & 20 MG TBPK Take 15-20 mg by mouth as directed. Take as directed on package: Start with one 15mg  tablet by mouth twice a day with food. On Day 22, switch to one 20mg  tablet once a day with food: Blood thinner to prevent blood clot (Patient not taking: Reported on 11/19/2015) 51 each 0 Not Taking at Unknown time    Treatment Modalities: Medication Management, Group therapy, Case management,  1 to 1 session with clinician, Psychoeducation, Recreational therapy.   Physician  Treatment Plan for Primary Diagnosis: Cocaine use disorder, severe, dependence (HCC) Long Term Goal(s): Improvement in symptoms so as ready for discharge   Short Term Goals: Ability to identify changes in lifestyle to reduce recurrence of condition will improve, Ability to maintain clinical measurements within normal limits will improve and Compliance with prescribed medications will improve  Medication Management: Evaluate patient's response, side effects, and tolerance of medication regimen.  Therapeutic Interventions: 1 to 1 sessions, Unit Group sessions and Medication administration.  Evaluation of Outcomes: Progressing  Physician Treatment Plan for Secondary Diagnosis: Principal  Problem:   Cocaine use disorder, severe, dependence (HCC) Active Problems:   Cocaine abuse   Cocaine-induced mood disorder (HCC)   MDD (major depressive disorder), severe (HCC)  Long Term Goal(s): Improvement in symptoms so as ready for discharge  Short Term Goals: Ability to identify changes in lifestyle to reduce recurrence of condition will improve, Compliance with prescribed medications will improve and Ability to identify triggers associated with substance abuse/mental health issues will improve  Medication Management: Evaluate patient's response, side effects, and tolerance of medication regimen.  Therapeutic Interventions: 1 to 1 sessions, Unit Group sessions and Medication administration.  Evaluation of Outcomes: Progressing   RN Treatment Plan for Primary Diagnosis: Cocaine use disorder, severe, dependence (HCC) Long Term Goal(s): Knowledge of disease and therapeutic regimen to maintain health will improve  Short Term Goals: Ability to identify and develop effective coping behaviors will improve and Compliance with prescribed medications will improve  Medication Management: RN will administer medications as ordered by provider, will assess and evaluate patient's response and provide education to patient for prescribed medication. RN will report any adverse and/or side effects to prescribing provider.  Therapeutic Interventions: 1 on 1 counseling sessions, Psychoeducation, Medication administration, Evaluate responses to treatment, Monitor vital signs and CBGs as ordered, Perform/monitor CIWA, COWS, AIMS and Fall Risk screenings as ordered, Perform wound care treatments as ordered.  Evaluation of Outcomes: Progressing   LCSW Treatment Plan for Primary Diagnosis: Cocaine use disorder, severe, dependence (HCC) Long Term Goal(s): Safe transition to appropriate next level of care at discharge, Engage patient in therapeutic group addressing interpersonal concerns.  Short Term  Goals: Engage patient in aftercare planning with referrals and resources, Increase emotional regulation and Identify triggers associated with mental health/substance abuse issues  Therapeutic Interventions: Assess for all discharge needs, 1 to 1 time with Social worker, Explore available resources and support systems, Assess for adequacy in community support network, Educate family and significant other(s) on suicide prevention, Complete Psychosocial Assessment, Interpersonal group therapy.  Evaluation of Outcomes: Progressing   Progress in Treatment :  Attending groups: No  Participating in groups: No  Taking medication as prescribed: Yes, MD continuing to assess for appropriate medication regimen  Toleration medication: Yes  Family/Significant other contact made: No, patient has declined collateral contact  Patient understands diagnosis: Yes  Discussing patient identified problems/goals with staff: Yes  Medical problems stabilized or resolved: Yes  Denies suicidal/homicidal ideation: Treatment team continuing to asses  Issues/concerns per patient self-inventory: None reported  Other: N/A  New problem(s) identified: None reported at this time    New Short Term/Long Term Goal(s): None at this time    Discharge Plan or Barriers: Patient plans to stay with his brother to follow up with outpatient services.     Reason for Continuation of Hospitalization: Anxiety Depression Medication stabilization Withdrawal symptoms  Estimated Length of Stay: 1-2 days    Attendees:  Patient:  Physician: Dr. Jama Flavorsobos & Dr. Elna BreslowEappen , MD  11/23/2015   9:30am  Nursing: Joslyn Devonaroline Beaudry, Midge AverJane Ochieng, RN  11/23/2015 9:30am  RN Care Manager:   Social Workers: Samuella BruinKristin Idora Brosious, LCSW, Chad CordialLauren Carter, LCSW 11/23/2015 9:30am  Nurse Pratictioners: Gray BernhardtMay Augustin, NP 11/23/15  Scribe for Treatment Team: Samuella BruinKristin Floraine Buechler, LCSW Clinical Social Worker [facility] [phone]

## 2015-11-24 NOTE — Plan of Care (Signed)
Problem: Activity: Goal: Sleeping patterns will improve Outcome: Progressing Pt slept 6 hours last night according to flowsheet.    

## 2015-11-24 NOTE — [facility] Group Notes (Signed)
[facility] Group Notes:  (Nursing/MHT/Case Management/Adjunct)  Date:  11/24/2015  Time:  9:29 AM  Type of Therapy:  Psychoeducational Skills  Participation Level:  Active  Participation Quality:  Appropriate and Attentive  Affect:  Appropriate  Cognitive:  Alert and Appropriate  Insight:  Appropriate  Engagement in Group:  Developing/Improving  Modes of Intervention:  Support  Summary of Progress/Problems: Patient listened attentively and asked appropriate questions.    Cranford MonBeaudry, Ardell Aaronson Evans 11/24/2015, 9:29 AM

## 2015-11-25 MED ORDER — NICOTINE 21 MG/24HR TD PT24: 21.0000 mg | MEDICATED_PATCH | Freq: Every day | TRANSDERMAL | 0 refills | Status: DC

## 2015-11-25 MED ORDER — TRAZODONE HCL 50 MG PO TABS: 50.0000 mg | ORAL_TABLET | Freq: Every day | ORAL | 0 refills | Status: DC

## 2015-11-25 MED ORDER — LISINOPRIL 10 MG PO TABS: 10.0000 mg | ORAL_TABLET | Freq: Every day | ORAL | 1 refills | Status: DC

## 2015-11-25 MED ORDER — IBUPROFEN 200 MG PO TABS: 800.0000 mg | ORAL_TABLET | Freq: Four times a day (QID) | ORAL | 0 refills | Status: DC | PRN

## 2015-11-25 MED ORDER — XARELTO VTE STARTER PACK 15 & 20 MG PO TBPK: 15.0000 mg | ORAL_TABLET | ORAL | 0 refills | Status: DC

## 2015-11-25 NOTE — [facility] Suicide Risk Assessment (Addendum)
[facility]   Principal Problem: MDD (major depressive disorder), recurrent severe, without psychosis (HCC) Discharge Diagnoses:  Patient Active Problem List   Diagnosis Date Noted  . Cannabis use disorder, moderate, dependence (HCC) [F12.20] 11/24/2015  . MDD (major depressive disorder), recurrent severe, without psychosis (HCC) [F33.2] 11/24/2015  . Cocaine use disorder, severe, dependence (HCC) [F14.20] 11/22/2015  . Chest pain [R07.9] 01/23/2012  . Syncope [R55] 01/16/2012  . DVT (deep venous thrombosis) (HCC) [I82.409] 01/16/2012  . Long term (current) use of anticoagulants [Z79.01] 08/16/2011  . TOBACCO USER [F17.200] 12/27/2008  . NONDEPENDENT COCAINE ABUSE EPISODIC [F14.10] 10/01/2008  . 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  Blood pressure (!) 136/96, pulse 77, temperature 98.3 F (36.8 C), resp. rate 17, height 6\' 5"  (1.956 m), weight 261 lb (118.4 kg), SpO2 97 %.Body mass index is 30.95 kg/m.  General Appearance: Fairly Groomed  Patent attorneyye Contact::  Good  Speech:  Normal Rate409  Volume:  Decreased  Mood:  states his mood is "OK", denies depression   Affect:  Appropriate and smiles at times appropriately   Thought Process:  Linear  Orientation:  Full (Time, Place, and Person)  Thought Content:  denies hallucinations, no delusions expressed   Suicidal Thoughts:  No  Homicidal Thoughts:  No  Memory:  Recent and Remote grossly intact  Judgement:  Other:  improving   Insight:  improving   Psychomotor Activity:  Normal  Concentration:  Good  Recall:  Good  Fund of Knowledge:Good  Language: Good  Akathisia:  Negative  Handed:  Right  AIMS (if indicated):     Assets:  Desire  for Improvement Social Support  Sleep:  Number of Hours: 4.75  Cognition: WNL  ADL's:  Intact   Mental Status Per Nursing Assessment::   On Admission:  NA  Demographic Factors:  40 year old male , married, employed   Loss Factors: Recent death of mother   Historical Factors: History of depression, history of cocaine and alcohol abuse  History of DVT   Risk Reduction Factors:   Resilience, states brother is supportive   Continued Clinical Symptoms:  At this time patient is alert, attentive, reports feeling better than on admission and currently denies feeling depressed. Does not present with any psychomotor agitation and is calm, in no distress or discomfort. Describes mood as "OK", affect is mildly irritable, but does smile at times appropriately, no thought disorder, denies suicidal or self injurious ideations, denies any homicidal ideations . No hallucinations, no delusions expressed . He is future oriented, requesting letter in order to be able to return to work  Denies cravings . Of note, patient states he does not feel depressed and states he is not interested in taking antidepressant on discharge. We reviewed potential risks- he sttates his depression is substance induced and mood improves with sobriety.  He is on Wellbutrin trial at this time- we have also reviewed side effects, and have reviewed with patient potential negative interactions / risks of concomitant Buproprion and Cocaine use . He wants to stop Wellbutrin , states " I don't really plan to take it "     Cognitive Features That Contribute To Risk:  No gross cognitive deficits noted upon discharge. Is alert ,  attentive, and oriented x 3   Suicide Risk:  Mild:  Suicidal ideation of limited frequency, intensity, duration, and specificity.  There are no identifiable plans, no associated intent, mild dysphoria and related symptoms, good self-control (both objective and subjective assessment), few other risk factors,  and identifiable protective factors, including available and accessible social support.  Follow-up Information    [facility] .   Specialty:  Behavioral Health Why:  Walk-in clinic Mondays, Wednesdays, and Fridays at 12:30pm for assessment for therapy and medication management services.  Contact information: [address] [place] [address] [phone]           Plan Of Care/Follow-up recommendations:  Activity:  as tolerated  Diet:  Heart Healthy Tests:  NA Other:  see below  Patient is requesting discharge- no grounds for involuntary commitment at this time  Plans to live with a brother after discharge, and states brother will provide safe, sober setting  Encouraged to maintain focus on sobriety, attend AA meetings, and to follow up with PCP for medical management   Nehemiah Massed, MD 11/25/2015, 11:49 AM

## 2015-11-25 NOTE — Progress Notes (Signed)
  [facility] :  Will you be returning to the same living situation after discharge:  No. Patient plans to stay with his brother at discharge At discharge, do you have transportation home?: Yes,  family will pick up Do you have the ability to pay for your medications: Yes,  patient will be provided with prescriptions at discharge  Release of information consent forms completed and in the chart;  Patient's signature needed at discharge.  Patient to Follow up at: Follow-up Information    [facility] .   Specialty:  Behavioral Health Why:  Walk-in clinic Mondays, Wednesdays, and Fridays at 12:30pm for assessment for therapy and medication management services.  Contact information: [address] [place] [address] [phone]           Next level of care provider [facility] Link:no  Safety Planning and Suicide Prevention discussed: Yes,  with patient  Have you used any form of tobacco in the last 30 days? (Cigarettes, Smokeless Tobacco, Cigars, and/or Pipes): Yes  Has patient been referred to the Quitline?: Patient refused referral  Patient has been referred for addiction treatment: Yes  Blaike Newburn L Shine Mikes 11/25/2015, 9:02 AM

## 2015-11-25 NOTE — Progress Notes (Signed)
Recreation Therapy Notes  Date: 11/25/15 Time: 0930 Location: [address] Group Room  Group Topic: Stress Management  Goal Area(s) Addresses:  Patient will verbalize importance of using healthy stress management.  Patient will identify positive emotions associated with healthy stress management.   Intervention: Stress Management  Activity :  Progressive Muscle Relaxation.  LRT introduced the technique of progressive muscle relaxation to patients.  LRT read script so patients to engage in technique.  Patients were to sit in a comfortable position and follow along as LRT read script.  Education:  Stress Management, Discharge Planning.   Education Outcome: Needs additional education  Clinical Observations/Feedback: Pt did not attend group.    Winola Drum, LRT/CTRS         Teshawn Moan A 11/25/2015 12:33 PM 

## 2015-11-25 NOTE — Tx Team (Signed)
Interdisciplinary Treatment and Diagnostic Plan Update  11/25/2015 Time of Session: 9:30am [name] MRN: [id number]  Principal Diagnosis: MDD (major depressive disorder), recurrent severe, without psychosis (HCC)  Secondary Diagnoses: Principal Problem:   MDD (major depressive disorder), recurrent severe, without psychosis (HCC) Active Problems:   Cocaine use disorder, severe, dependence (HCC)   Cannabis use disorder, moderate, dependence (HCC)   Current Medications:  Current Facility-Administered Medications  Medication Dose Route Frequency Provider Last Rate Last Dose  . acetaminophen (TYLENOL) tablet 650 mg  650 mg Oral Q6H PRN Truman Hayward, FNP   650 mg at 11/21/15 1849  . alum & mag hydroxide-simeth (MAALOX/MYLANTA) 200-200-20 MG/5ML suspension 30 mL  30 mL Oral Q4H PRN Truman Hayward, FNP      . buPROPion (WELLBUTRIN XL) 24 hr tablet 150 mg  150 mg Oral Daily Sanjuana Kava, NP   150 mg at 11/21/15 1733  . ibuprofen (ADVIL,MOTRIN) tablet 600 mg  600 mg Oral Q8H PRN Adonis Brook, NP      . lisinopril (PRINIVIL,ZESTRIL) tablet 10 mg  10 mg Oral Daily Truman Hayward, FNP      . magnesium hydroxide (MILK OF MAGNESIA) suspension 30 mL  30 mL Oral Daily PRN Truman Hayward, FNP      . multivitamin with minerals tablet 1 tablet  1 tablet Oral Daily Adonis Brook, NP   1 tablet at 11/20/15 2220  . nicotine (NICODERM CQ - dosed in mg/24 hours) patch 21 mg  21 mg Transdermal Daily Adonis Brook, NP      . rivaroxaban (XARELTO) tablet 20 mg  20 mg Oral Daily Neysa Hotter, MD      . thiamine (VITAMIN B-1) tablet 100 mg  100 mg Oral Daily Adonis Brook, NP      . traZODone (DESYREL) tablet 50 mg  50 mg Oral QHS Sanjuana Kava, NP   50 mg at 11/23/15 2337   PTA Medications: Prescriptions Prior to Admission  Medication Sig Dispense Refill Last Dose  . FLUoxetine (PROZAC) 20 MG capsule Take 1 capsule (20 mg total) by mouth daily. For depression (Patient not taking: Reported on  11/19/2015) 30 capsule 0 Not Taking at Unknown time  . ibuprofen (ADVIL,MOTRIN) 200 MG tablet Take 800 mg by mouth every 6 (six) hours as needed for moderate pain.   2 weeks  . lisinopril (PRINIVIL,ZESTRIL) 10 MG tablet Take 1 tablet (10 mg total) by mouth daily. For high blood pressure (Patient not taking: Reported on 11/19/2015) 30 tablet 1 Not Taking at Unknown time  . nicotine (NICODERM CQ - DOSED IN MG/24 HOURS) 21 mg/24hr patch Place 1 patch (21 mg total) onto the skin daily. For smoking cessation (Patient not taking: Reported on 11/19/2015) 28 patch 0 Not Taking at Unknown time  . traZODone (DESYREL) 100 MG tablet Take 1 tablet (100 mg total) by mouth at bedtime as needed for sleep. (Patient not taking: Reported on 11/19/2015) 30 tablet 0 Not Taking at Unknown time  . XARELTO STARTER PACK 15 & 20 MG TBPK Take 15-20 mg by mouth as directed. Take as directed on package: Start with one 15mg  tablet by mouth twice a day with food. On Day 22, switch to one 20mg  tablet once a day with food: Blood thinner to prevent blood clot (Patient not taking: Reported on 11/19/2015) 51 each 0 Not Taking at Unknown time    Treatment Modalities: Medication Management, Group therapy, Case management,  1 to 1 session with clinician, Psychoeducation, Recreational  therapy.   Physician Treatment Plan for Primary Diagnosis: MDD (major depressive disorder), recurrent severe, without psychosis (HCC) Long Term Goal(s): Improvement in symptoms so as ready for discharge   Short Term Goals: Ability to identify changes in lifestyle to reduce recurrence of condition will improve, Ability to maintain clinical measurements within normal limits will improve and Compliance with prescribed medications will improve  Medication Management: Evaluate patient's response, side effects, and tolerance of medication regimen.  Therapeutic Interventions: 1 to 1 sessions, Unit Group sessions and Medication administration.  Evaluation of  Outcomes: Adequate for Discharge  Physician Treatment Plan for Secondary Diagnosis: Principal Problem:   MDD (major depressive disorder), recurrent severe, without psychosis (HCC) Active Problems:   Cocaine use disorder, severe, dependence (HCC)   Cannabis use disorder, moderate, dependence (HCC)  Long Term Goal(s): Improvement in symptoms so as ready for discharge  Short Term Goals: Ability to identify changes in lifestyle to reduce recurrence of condition will improve, Compliance with prescribed medications will improve and Ability to identify triggers associated with substance abuse/mental health issues will improve  Medication Management: Evaluate patient's response, side effects, and tolerance of medication regimen.  Therapeutic Interventions: 1 to 1 sessions, Unit Group sessions and Medication administration.  Evaluation of Outcomes: Adequate for Discharge   RN Treatment Plan for Primary Diagnosis: MDD (major depressive disorder), recurrent severe, without psychosis (HCC) Long Term Goal(s): Knowledge of disease and therapeutic regimen to maintain health will improve  Short Term Goals: Ability to identify and develop effective coping behaviors will improve and Compliance with prescribed medications will improve  Medication Management: RN will administer medications as ordered by provider, will assess and evaluate patient's response and provide education to patient for prescribed medication. RN will report any adverse and/or side effects to prescribing provider.  Therapeutic Interventions: 1 on 1 counseling sessions, Psychoeducation, Medication administration, Evaluate responses to treatment, Monitor vital signs and CBGs as ordered, Perform/monitor CIWA, COWS, AIMS and Fall Risk screenings as ordered, Perform wound care treatments as ordered.  Evaluation of Outcomes: Adequate for Discharge   LCSW Treatment Plan for Primary Diagnosis: MDD (major depressive disorder), recurrent  severe, without psychosis (HCC) Long Term Goal(s): Safe transition to appropriate next level of care at discharge, Engage patient in therapeutic group addressing interpersonal concerns.  Short Term Goals: Engage patient in aftercare planning with referrals and resources, Increase emotional regulation and Identify triggers associated with mental health/substance abuse issues  Therapeutic Interventions: Assess for all discharge needs, 1 to 1 time with Social worker, Explore available resources and support systems, Assess for adequacy in community support network, Educate family and significant other(s) on suicide prevention, Complete Psychosocial Assessment, Interpersonal group therapy.  Evaluation of Outcomes: Adequate for Discharge   Progress in Treatment :  Attending groups: No  Participating in groups: No  Taking medication as prescribed: Yes, MD continuing to assess for appropriate medication regimen  Toleration medication: Yes  Family/Significant other contact made: No, patient has declined collateral contact  Patient understands diagnosis: Yes  Discussing patient identified problems/goals with staff: Yes  Medical problems stabilized or resolved: Yes  Denies suicidal/homicidal ideation: Yes, denies  Issues/concerns per patient self-inventory: None reported  Other: N/A  New problem(s) identified: None reported at this time    New Short Term/Long Term Goal(s): None at this time    Discharge Plan or Barriers: Patient plans to stay with his brother to follow up with outpatient services.     Reason for Continuation of Hospitalization: Anxiety Depression Medication stabilization Withdrawal symptoms  Estimated Length of Stay: Discharge anticipated for today 11/25/15    Attendees:  Patient:   Physician:  Dr. Jama Flavors , MD  11/25/2015   9:30am  Nursing: Midge Aver, Joslyn Devon, RN  11/25/2015 9:30am  RN Care Manager: Onnie Boer, CM  11/25/2015 9:30am  Social  Workers: Samuella Bruin, LCSW,  11/25/2015 9:30am  Nurse Pratictioners: Karolee Stamps, NP 11/25/15   Scribe for Treatment Team: Samuella Bruin, LCSW Clinical Social Worker [facility] [phone]

## 2015-11-25 NOTE — Progress Notes (Signed)
Pt discharged home with his pastor. Pt was ambulatory, stable and appreciative at that time. All papers and prescriptions were given and valuables returned. Verbal understanding expressed. Denies SI/HI and A/VH. Pt given opportunity to express concerns and ask questions.

## 2015-11-25 NOTE — Discharge Summary (Signed)
Physician Discharge Summary Note  Patient:  [name] is an 40 y.o., male  MRN:  [id number]  DOB:  June 13, 1975  Patient phone:  [phone] (home)   Patient address:   [place] [address],   Total Time spent with patient: Greater than 30 minutes  Date of Admission:  11/20/2015  Date of Discharge: 11-25-14  Reason for Admission: Worsening symptoms of depression/cocaine abuse  Principal Problem: MDD (major depressive disorder), recurrent severe, without psychosis [facility])  Discharge Diagnoses: Patient Active Problem List   Diagnosis Date Noted  . Cocaine use disorder, severe, dependence ([place]) [F14.20] 11/22/2015    Priority: High  . Cannabis use disorder, moderate, dependence ([place]) [F12.20] 11/24/2015  . MDD (major depressive disorder), recurrent severe, without psychosis ([place]) [F33.2] 11/24/2015  . Chest pain [R07.9] 01/23/2012  . Syncope [R55] 01/16/2012  . DVT (deep venous thrombosis) ([place]) [I82.409] 01/16/2012  . Long term (current) use of anticoagulants [Z79.01] 08/16/2011  . TOBACCO USER [F17.200] 12/27/2008  . NONDEPENDENT COCAINE ABUSE EPISODIC [F14.10] 10/01/2008  . PULMONARY EMBOLISM [I26.99] 08/26/2008   Musculoskeletal: Strength & Muscle Tone: within normal limits Gait & Station: normal Patient leans: N/A  Psychiatric Specialty Exam: Physical Exam  Constitutional: He appears well-developed.  HENT:  Head: Normocephalic.  Eyes: Pupils are equal, round, and reactive to light.  Neck: Normal range of motion.  Cardiovascular: Normal rate.   Respiratory: Effort normal.  GI: Soft.  Genitourinary:  Genitourinary Comments: Denies any issues in this area  Musculoskeletal: Normal range of motion.  Neurological: He is alert.  Skin: Skin is warm.  Psychiatric: His speech is normal and behavior is normal. Judgment and thought content normal. His mood appears not anxious. His affect is not angry, not blunt, not labile and not  inappropriate. Cognition and memory are normal. He does not exhibit a depressed mood.    Review of Systems  Constitutional: Negative.   HENT: Negative.   Eyes: Negative.   Respiratory: Negative.   Cardiovascular: Negative.   Gastrointestinal: Negative.   Genitourinary: Negative.   Musculoskeletal: Negative.   Skin: Negative.   Neurological: Negative.   Endo/Heme/Allergies: Negative.   Psychiatric/Behavioral: Positive for depression (Stable) and substance abuse (Stable). Negative for hallucinations, memory loss and suicidal ideas. The patient has insomnia (Stable).     Blood pressure (!) 136/96, pulse 77, temperature 98.3 F (36.8 C), resp. rate 17, height _0  (1.956 m), weight 118.4 kg (261 lb), SpO2 97 %.Body mass index is 30.95 kg/m.  See Md's SRA  Have you used any form of tobacco in the last 30 days? (Cigarettes, Smokeless Tobacco, Cigars, and/or Pipes): Yes  Has this patient used any form of tobacco in the last 30 days? (Cigarettes, Smokeless Tobacco, Cigars, and/or Pipes) Yes, A prescription for an FDA-approved tobacco cessation medication was offered at discharge and the patient refused  Past Medical History:  Past Medical History:  Diagnosis Date  . Cocaine abuse   . DVT of leg (deep venous thrombosis) ([place]) 01/16/2012   right  . Family history of early CAD    Father deceased at 69 yo with MI  . PE (pulmonary embolism) 2011; 07/2011  . Syncope and collapse 01/16/2012   "woke up to dog licking my face" (23/53/6144)  . Tobacco abuse     Past Surgical History:  Procedure Laterality Date  . NO PAST SURGERIES     Family History:  Family History  Problem Relation Age of Onset  . Coronary artery disease Father  36    MI at age 76  . Colon cancer Paternal Grandmother   . Cancer Maternal Grandfather    Social History:  History  Alcohol Use  . Yes    Comment: occasion     History  Drug Use  . Types: Cocaine, Marijuana    Social History   Social History  .  Marital status: Single    Spouse name: N/A  . Number of children: 0  . Years of education: college   Occupational History  . Musician     plays piano   Social History Main Topics  . Smoking status: Current Every Day Smoker    Packs/day: 0.50    Years: 5.00    Types: Cigarettes  . Smokeless tobacco: Never Used  . Alcohol use Yes     Comment: occasion  . Drug use:     Types: Cocaine, Marijuana  . Sexual activity: Yes   Other Topics Concern  . None   Social History Narrative   Lives in [place] by himself.   Risk to Self: Is patient at risk for suicide?: No What has been your use of drugs/alcohol within the last 12 months?: Cocaine, crack alcohol and weed Risk to Others: No Prior Inpatient Therapy: Yes Prior Outpatient Therapy: Yes  Level of Care:  OP  Hospital Course:  This is an admission assessment for [name], a 40 year old AA male with hx of polysubstance dependence. Has been a patient in this hospital previously due to substance abuse issues as well. He is being admitted from the [facility] ED with complaints of increased drug & alcohol abuse. During this assessment, [name] reports very angrily' " Why can't you read my statement from the ED.  What is it with you all asking me the same fucking questions over & over. I'm tired. I don't need to be talking no body right now, shit. Yes, I went to the [place] ED on Thursday. Yes I went by myself. I have been using the fucking cocaine & alcohol x 2 weeks. I was smoking 3 grams of crack & drinking 6 packs of beer daily for 2 weeks. I was clean for almost a year prior to this. I was in the [facility] for [facility] abuse last year. I stopped going after I completed the program x 6 months. I relapsed 2 weeks ago. I don't know why I relapsed. I'm not depressed or suicidal. But, if you want me to rate my depression, then it is #10. I don't have any withdrawal symptoms at this time. Can I go now".   [name] was admitted to the [facility]  adult unit with his UDS test results positive for Cocaine & THC. He was presenting very angry & agitated on the day of his admission assessment. He was upset because he stated that he had completed 6 months of drug treatment at the [facility] & had to relapse when he stopped going to treatments. He used for 2 weeks. He reported being depressed & suicidal. He was in need of mood stabilization treatments.   After admission assessment/evaluation, it was determined based on his symptoms that Raylee will need medication management to re-stabilize his current depressive mood symptoms. Dionel did not receive any detoxification treatments. However, he was started on Wellbutrin XL 150 mg for depression. He declined to take it. He also refused to take his blood pressure & blood thinner medications. He stated that he does not need to take these medications any more. He  was enrolled in the group counseling sessions being offered & held on this unit. He seldom participated.   Trajan's symptoms has improved. This is evidenced by his daily reports of improved mood, reduction of symptoms and presentation of good affect/eye contact. He met with his treatment team this am. His reason for admission, present symptoms, response to treatment and discharge plans discussed. Mr. Mowrey endorsed that his symptoms has stabilized and that he is ready for discharge to pursue psychiatric care on an outpatient basis as noted below. Upon discharge, Rafan adamantly denies any suicidal, homicidal ideations, auditory, visual hallucinations, paranoia and or delusional thoughts. He left [facility] with all personal belongings via personal arranged transport in no apparent distress.   Consults:  psychiatry  Discharge Vitals:   Blood pressure (!) 136/96, pulse 77, temperature 98.3 F (36.8 C), resp. rate 17, height _0  (1.956 m), weight 118.4 kg (261 lb), SpO2 97 %. Body mass index is 30.95 kg/m. Lab Results:   No results found for this or any previous visit  (from the past 72 hour(s)).  Physical Findings: AIMS: Facial and Oral Movements Muscles of Facial Expression: None, normal Lips and Perioral Area: None, normal Jaw: None, normal Tongue: None, normal,Extremity Movements Upper (arms, wrists, hands, fingers): None, normal Lower (legs, knees, ankles, toes): None, normal, Trunk Movements Neck, shoulders, hips: None, normal, Overall Severity Severity of abnormal movements (highest score from questions above): None, normal Incapacitation due to abnormal movements: None, normal Patient's awareness of abnormal movements (rate only patient's report): No Awareness, Dental Status Current problems with teeth and/or dentures?: No Does patient usually wear dentures?: No  CIWA:  CIWA-Ar Total: 1 COWS:     See Psychiatric Specialty Exam and Suicide Risk Assessment completed by Attending Physician prior to discharge.  Discharge destination:  Home  Is patient on multiple antipsychotic therapies at discharge:  No   Has Patient had three or more failed trials of antipsychotic monotherapy by history:  No  Recommended Plan for Multiple Antipsychotic Therapies: NA    Medication List    STOP taking these medications   FLUoxetine 20 MG capsule Commonly known as:  PROZAC     TAKE these medications     Indication  ibuprofen 200 MG tablet Commonly known as:  ADVIL,MOTRIN Take 4 tablets (800 mg total) by mouth every 6 (six) hours as needed for moderate pain.  Indication:  Mild to Moderate Pain   lisinopril 10 MG tablet Commonly known as:  PRINIVIL,ZESTRIL Take 1 tablet (10 mg total) by mouth daily. For high blood pressure  Indication:  High Blood Pressure Disorder   nicotine 21 mg/24hr patch Commonly known as:  NICODERM CQ - dosed in mg/24 hours Place 1 patch (21 mg total) onto the skin daily. For smoking cessation  Indication:  Nicotine Addiction   traZODone 50 MG tablet Commonly known as:  DESYREL Take 1 tablet (50 mg total) by mouth at  bedtime. For insomnia What changed:  medication strength  how much to take  when to take this  reasons to take this  additional instructions  Indication:  Trouble Sleeping   XARELTO STARTER PACK 15 & 20 MG Tbpk Generic drug:  Rivaroxaban Take 15-20 mg by mouth as directed. Take as directed on package: Start with one 67m tablet by mouth twice a day with food. On Day 22, switch to one 267mtablet once a day with food: Blood thinner to prevent blood clot  Indication:  Blood Clot in a Deep Vein  Follow-up Information    [facility] .   Specialty:  Behavioral Health Why:  Walk-in clinic Mondays, Wednesdays, and Fridays at 12:30pm for assessment for therapy and medication management services.  Contact information: [address] [place] [address] [phone]          Follow-up recommendations: Activity:  As tolerated Diet: As recommended by your primary care doctor. Keep all scheduled follow-up appointments as recommended.   Comments: Take all your medications as prescribed by your mental healthcare provider. Report any adverse effects and or reactions from your medicines to your outpatient provider promptly. Patient is instructed and cautioned to not engage in alcohol and or illegal drug use while on prescription medicines. In the event of worsening symptoms, patient is instructed to call the crisis hotline, 911 and or go to the nearest ED for appropriate evaluation and treatment of symptoms. Follow-up with your primary care provider for your other medical issues, concerns and or health care needs.   Total Discharge Time: Great than 30 minutes  Signed: Encarnacion Slates, PNHNP, FNP-BC 11/26/2015, 4:30 PM  Patient seen, Suicide Assessment Completed.  Disposition Plan Reviewed

## 2016-04-22 ENCOUNTER — Emergency Department (HOSPITAL_COMMUNITY)
Admission: EM | Admit: 2016-04-22 | Discharge: 2016-04-22 | Disposition: A | Discharge status: Home / Self Care | Payer: Self-pay | Attending: Emergency Medicine | Admitting: Emergency Medicine

## 2016-04-22 ENCOUNTER — Emergency Department (HOSPITAL_BASED_OUTPATIENT_CLINIC_OR_DEPARTMENT_OTHER)
Admit: 2016-04-22 | Discharge: 2016-04-22 | Disposition: A | Discharge status: Home / Self Care | Payer: Self-pay | Attending: Emergency Medicine | Admitting: Emergency Medicine

## 2016-04-22 ENCOUNTER — Encounter (HOSPITAL_COMMUNITY): Payer: Self-pay | Admitting: *Deleted

## 2016-04-22 DIAGNOSIS — I825Z1 Chronic embolism and thrombosis of unspecified deep veins of right distal lower extremity: Secondary | ICD-10-CM | POA: Insufficient documentation

## 2016-04-22 DIAGNOSIS — M7989 Other specified soft tissue disorders: Secondary | ICD-10-CM

## 2016-04-22 DIAGNOSIS — L089 Local infection of the skin and subcutaneous tissue, unspecified: Secondary | ICD-10-CM

## 2016-04-22 DIAGNOSIS — Z79899 Other long term (current) drug therapy: Secondary | ICD-10-CM | POA: Insufficient documentation

## 2016-04-22 DIAGNOSIS — T148XXA Other injury of unspecified body region, initial encounter: Secondary | ICD-10-CM

## 2016-04-22 DIAGNOSIS — M79661 Pain in right lower leg: Secondary | ICD-10-CM

## 2016-04-22 DIAGNOSIS — M79609 Pain in unspecified limb: Secondary | ICD-10-CM

## 2016-04-22 DIAGNOSIS — F17210 Nicotine dependence, cigarettes, uncomplicated: Secondary | ICD-10-CM | POA: Insufficient documentation

## 2016-04-22 DIAGNOSIS — Z86718 Personal history of other venous thrombosis and embolism: Secondary | ICD-10-CM

## 2016-04-22 DIAGNOSIS — Z7901 Long term (current) use of anticoagulants: Secondary | ICD-10-CM | POA: Insufficient documentation

## 2016-04-22 LAB — CBC WITH DIFFERENTIAL/PLATELET
BASOS PCT: 0 %
Basophils Absolute: 0 10*3/uL (ref 0.0–0.1)
EOS ABS: 0.2 10*3/uL (ref 0.0–0.7)
Eosinophils Relative: 3 %
HCT: 42.1 % (ref 39.0–52.0)
Hemoglobin: 14 g/dL (ref 13.0–17.0)
LYMPHS ABS: 1.4 10*3/uL (ref 0.7–4.0)
Lymphocytes Relative: 19 %
MCH: 28.3 pg (ref 26.0–34.0)
MCHC: 33.3 g/dL (ref 30.0–36.0)
MCV: 85.2 fL (ref 78.0–100.0)
MONO ABS: 0.9 10*3/uL (ref 0.1–1.0)
MONOS PCT: 11 %
Neutro Abs: 5 10*3/uL (ref 1.7–7.7)
Neutrophils Relative %: 67 %
Platelets: 156 10*3/uL (ref 150–400)
RBC: 4.94 MIL/uL (ref 4.22–5.81)
RDW: 14.1 % (ref 11.5–15.5)
WBC: 7.5 10*3/uL (ref 4.0–10.5)

## 2016-04-22 LAB — BASIC METABOLIC PANEL
Anion gap: 9 (ref 5–15)
BUN: 16 mg/dL (ref 6–20)
CALCIUM: 9.3 mg/dL (ref 8.9–10.3)
CHLORIDE: 104 mmol/L (ref 101–111)
CO2: 27 mmol/L (ref 22–32)
CREATININE: 1.28 mg/dL — AB (ref 0.61–1.24)
GFR calc Af Amer: >60 mL/min (ref >60)
GFR calc non Af Amer: >60 mL/min (ref >60)
GLUCOSE: 98 mg/dL (ref 65–99)
Potassium: 4.5 mmol/L (ref 3.5–5.1)
Sodium: 140 mmol/L (ref 135–145)

## 2016-04-22 MED ORDER — SULFAMETHOXAZOLE-TRIMETHOPRIM 800-160 MG PO TABS: 1.0000 | ORAL_TABLET | Freq: Two times a day (BID) | ORAL | 0 refills | Status: AC

## 2016-04-22 NOTE — ED Provider Notes (Signed)
[facility]-EMERGENCY DEPT Provider Note   CSN: [id number] Arrival date & time: 04/22/16  [id number]     History   Chief Complaint Chief Complaint  Patient presents with  . Leg Pain    HPI [name] is a 41 y.o. male.   He is here for evaluation of right lower leg swelling, with a sore on it. He noticed the increasing swelling and sore, 5 days ago. It is getting worse. No known trauma. He denies shortness of breath, chest pain, fever, chills, nausea, vomiting, weakness or dizziness. He has had recurrent DVTs in the past, and stopped Xarelto about 6 months ago. He was told that he needed to be "on it for life". He does not currently have a PCP. He works as a Technical sales engineermusician. There are no other no modifying factors.    HPI  Past Medical History:  Diagnosis Date  . Cocaine abuse   . DVT of leg (deep venous thrombosis) ([facility]) 01/16/2012   right  . Family history of early CAD    Father deceased at 39 yo with MI  . PE (pulmonary embolism) 2011; 07/2011  . Syncope and collapse 01/16/2012   "woke up to dog licking my face" (01/16/2012)  . Tobacco abuse     Patient Active Problem List   Diagnosis Date Noted  . Cannabis use disorder, moderate, dependence ([facility]) 11/24/2015  . MDD (major depressive disorder), recurrent severe, without psychosis ([facility]) 11/24/2015  . Cocaine use disorder, severe, dependence ([facility]) 11/22/2015  . Chest pain 01/23/2012  . Syncope 01/16/2012  . DVT (deep venous thrombosis) ([facility]) 01/16/2012  . Long term (current) use of anticoagulants 08/16/2011  . TOBACCO USER 12/27/2008  . NONDEPENDENT COCAINE ABUSE EPISODIC 10/01/2008  . 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 NO PAST SURGERIES         Home Medications    Prior to Admission medications   Medication Sig Start Date End Date Taking? Authorizing Provider  ibuprofen (ADVIL,MOTRIN) 200 MG tablet Take 4 tablets (800 mg total) by mouth every 6 (six) hours as needed for  moderate pain. 11/25/15   Sanjuana KavaAgnes I Nwoko, NP  lisinopril (PRINIVIL,ZESTRIL) 10 MG tablet Take 1 tablet (10 mg total) by mouth daily. For high blood pressure 11/25/15   Sanjuana KavaAgnes I Nwoko, NP  nicotine (NICODERM CQ - DOSED IN MG/24 HOURS) 21 mg/24hr patch Place 1 patch (21 mg total) onto the skin daily. For smoking cessation 11/25/15   Sanjuana KavaAgnes I Nwoko, NP  sulfamethoxazole-trimethoprim (BACTRIM DS,SEPTRA DS) 800-160 MG tablet Take 1 tablet by mouth 2 (two) times daily. 04/22/16 04/29/16  Mancel BaleElliott Axxel Gude, MD  traZODone (DESYREL) 50 MG tablet Take 1 tablet (50 mg total) by mouth at bedtime. For insomnia 11/25/15   Sanjuana KavaAgnes I Nwoko, NP  XARELTO STARTER PACK 15 & 20 MG TBPK Take 15-20 mg by mouth as directed. Take as directed on package: Start with one 15mg  tablet by mouth twice a day with food. On Day 22, switch to one 20mg  tablet once a day with food: Blood thinner to prevent blood clot 11/25/15   Sanjuana KavaAgnes I Nwoko, NP    Family History Family History  Problem Relation Age of Onset  . Coronary artery disease Father [id number]    MI at age 636  . Colon cancer Paternal Grandmother   . Cancer Maternal Grandfather     Social History Social History  Substance Use Topics  . Smoking status: Current Every Day Smoker  Packs/day: 0.50    Years: 5.00    Types: Cigarettes  . Smokeless tobacco: Never Used  . Alcohol use Yes     Comment: occasion     Allergies   Patient has no known allergies.   Review of Systems Review of Systems  All other systems reviewed and are negative.    Physical Exam Updated Vital Signs BP 126/85 (BP Location: Left Arm)   Pulse 78   Temp 98.2 F (36.8 C) (Oral)   Resp 16   Ht 6\' 5"  (1.956 m)   Wt 270 lb (122.5 kg)   SpO2 100%   BMI 32.02 kg/m   Physical Exam  Constitutional: He is oriented to person, place, and time. He appears well-developed and well-nourished. No distress.  HENT:  Head: Normocephalic and atraumatic.  Right Ear: External ear normal.  Left Ear: External ear normal.    Eyes: Conjunctivae and EOM are normal. Pupils are equal, round, and reactive to light.  Neck: Normal range of motion and phonation normal. Neck supple.  Cardiovascular: Normal rate.   Pulmonary/Chest: Effort normal. He exhibits no bony tenderness.  Musculoskeletal: Normal range of motion.  Right lower leg swollen. The lower leg is not significantly tender in the calf region. There is a wound of the left medial ankle approximately 1 cm in diameter, with a small amount of purulent drainage. There is no fluctuance at the site. There is a small amount of proximal redness consistent with very early lymphangitis, around this wound. Normal range of motion. Right leg, knee and ankle.  Neurological: He is alert and oriented to person, place, and time. No cranial nerve deficit or sensory deficit. He exhibits normal muscle tone. Coordination normal.  Skin: Skin is warm, dry and intact.  Psychiatric: He has a normal mood and affect. His behavior is normal. Judgment and thought content normal.  Nursing note and vitals reviewed.    ED Treatments / Results  Labs (all labs ordered are listed, but only abnormal results are displayed) Labs Reviewed  BASIC METABOLIC PANEL - Abnormal; Notable for the following:       Result Value   Creatinine, Ser 1.28 (*)    All other components within normal limits  CBC WITH DIFFERENTIAL/PLATELET    EKG  EKG Interpretation None       Radiology No results found.  Procedures Procedures (including critical care time)  Medications Ordered in ED Medications - No data to display   Initial Impression / Assessment and Plan / ED Course  I have reviewed the triage vital signs and the nursing notes.  Pertinent labs & imaging results that were available during my care of the patient were reviewed by me and considered in my medical decision making (see chart for details).     Medications - No data to display  Patient Vitals for the past 24 hrs:  BP Temp Temp  src Pulse Resp SpO2 Height Weight  04/22/16 1031 126/85 - - 78 16 100 % - -  04/22/16 0748 137/89 98.2 F (36.8 C) Oral 94 16 98 % - -  04/22/16 0747 - - - - - - 6\' 5"  (1.956 m) 270 lb (122.5 kg)    At discharge- Reevaluation with update and discussion. After initial assessment and treatment, an updated evaluation reveals he is comfortable and has no further complaints. Findings discussed with the patient and all questions were answered. 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 Mild associated lymphangitis. Doubt deep tissue infection or abscess. Right leg swelling, with chronic appearing DVT below the knee. Patient has been off anticoagulants, for 6 months. It is not clear, at this time, that he needs to be anticoagulated urgently. He does not have any known causative factors for recurrent DVT. 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 He would like to minimize expense for treatment if possible. He understands a consultation with a vascular specialist could help to arrive at a good definitive treatment plan that is both safe and cost effective. It is safe to wait several weeks to get that done. The patient understands that should he worsen, he should return to the emergency department for evaluation and treatment.  Nursing Notes Reviewed/ Care Coordinated Applicable Imaging Reviewed Interpretation of Laboratory Data incorporated into ED treatment  The patient appears reasonably screened and/or stabilized for discharge and I doubt any other medical condition or other [facility] requiring further screening, evaluation, or treatment in the ED at this time prior to discharge.  Plan: Home Medications- continue; Home Treatments-warm compresses to wound at the  right ankle, elevate legs to decrease swelling rest; return here if the recommended treatment, does not improve the symptoms; Recommended follow up- PCP prn. Vasc. Surg 1-3 weeks    New Prescriptions Discharge Medication List as of 04/22/2016 10:11 AM    START taking these medications   Details  sulfamethoxazole-trimethoprim (BACTRIM DS,SEPTRA DS) 800-160 MG tablet Take 1 tablet by mouth 2 (two) times daily., Starting Fri 04/22/2016, Until Fri 04/29/2016, Print         Mancel Bale, MD 04/22/16 1340

## 2016-04-22 NOTE — ED Notes (Signed)
States dx with a clot DVT in rt calf a m onth ago  And has not taken his meds for this now c/o rt leg and ankle pain

## 2016-04-22 NOTE — ED Notes (Signed)
[facility], placed  In gown and resettled in bed

## 2016-04-22 NOTE — ED Triage Notes (Signed)
Pt c/o R leg swelling worsening this week with R inner ankle wound that pt reports is not healing, pt non compliant with blood thinners x 6 mths with hx of DVT, pt denies SOB, ambulatory with pain, A&O x4

## 2016-04-22 NOTE — ED Notes (Signed)
Pt returned to room from imaging department.  

## 2016-04-22 NOTE — Progress Notes (Signed)
Preliminary results by tech - Right Lower Ext. Venous Duplex Completed. Chronic appearing deep vein thrombosis involving the popliteal vein and calf veins. Results given to [facility], patient's nurse. Marilynne Halstedita Ladina Shutters, BS, RDMS, RVT

## 2016-04-22 NOTE — Discharge Instructions (Signed)
The testing today indicated that you have a chronic DVT in the right lower leg. There is no indication to restart anticoagulation today. If you develop increased pain, swelling, or suddenly developed shortness of breath, return here for reevaluation.  For the wound of the right ankle, use a warm compress on it 3 or 4 times a day, and clean it well with soap and water frequently.  For the swelling in the right leg, keep it elevated above your heart, as much as possible.

## 2017-02-13 ENCOUNTER — Emergency Department: Payer: Self-pay

## 2017-02-13 ENCOUNTER — Encounter: Payer: Self-pay | Admitting: Intensive Care

## 2017-02-13 ENCOUNTER — Emergency Department
Admission: EM | Admit: 2017-02-13 | Discharge: 2017-02-13 | Disposition: A | Discharge status: Home / Self Care | Payer: Self-pay | Attending: Emergency Medicine | Admitting: Emergency Medicine

## 2017-02-13 DIAGNOSIS — I82511 Chronic embolism and thrombosis of right femoral vein: Secondary | ICD-10-CM | POA: Insufficient documentation

## 2017-02-13 DIAGNOSIS — F1220 Cannabis dependence, uncomplicated: Secondary | ICD-10-CM | POA: Insufficient documentation

## 2017-02-13 DIAGNOSIS — Z79899 Other long term (current) drug therapy: Secondary | ICD-10-CM | POA: Insufficient documentation

## 2017-02-13 DIAGNOSIS — Z7901 Long term (current) use of anticoagulants: Secondary | ICD-10-CM | POA: Insufficient documentation

## 2017-02-13 DIAGNOSIS — F17210 Nicotine dependence, cigarettes, uncomplicated: Secondary | ICD-10-CM | POA: Insufficient documentation

## 2017-02-13 DIAGNOSIS — F329 Major depressive disorder, single episode, unspecified: Secondary | ICD-10-CM | POA: Insufficient documentation

## 2017-02-13 DIAGNOSIS — F1410 Cocaine abuse, uncomplicated: Secondary | ICD-10-CM | POA: Insufficient documentation

## 2017-02-13 DIAGNOSIS — L03115 Cellulitis of right lower limb: Secondary | ICD-10-CM | POA: Insufficient documentation

## 2017-02-13 LAB — COMPREHENSIVE METABOLIC PANEL
ALBUMIN: 3.4 g/dL — AB (ref 3.5–5.0)
ALT: 26 U/L (ref 17–63)
AST: 24 U/L (ref 15–41)
Alkaline Phosphatase: 61 U/L (ref 38–126)
Anion gap: 6 (ref 5–15)
BUN: 13 mg/dL (ref 6–20)
CHLORIDE: 107 mmol/L (ref 101–111)
CO2: 26 mmol/L (ref 22–32)
CREATININE: 1.31 mg/dL — AB (ref 0.61–1.24)
Calcium: 8.9 mg/dL (ref 8.9–10.3)
GFR calc Af Amer: >60 mL/min (ref >60)
Glucose, Bld: 95 mg/dL (ref 65–99)
POTASSIUM: 4.3 mmol/L (ref 3.5–5.1)
SODIUM: 139 mmol/L (ref 135–145)
Total Bilirubin: 0.6 mg/dL (ref 0.3–1.2)
Total Protein: 6.3 g/dL — ABNORMAL LOW (ref 6.5–8.1)

## 2017-02-13 LAB — CBC WITH DIFFERENTIAL/PLATELET
BASOS PCT: 1 %
Basophils Absolute: 0.1 10*3/uL (ref 0–0.1)
EOS ABS: 0.2 10*3/uL (ref 0–0.7)
Eosinophils Relative: 3 %
HCT: 47.2 % (ref 40.0–52.0)
Hemoglobin: 15.6 g/dL (ref 13.0–18.0)
LYMPHS PCT: 25 %
Lymphs Abs: 1.8 10*3/uL (ref 1.0–3.6)
MCH: 29.2 pg (ref 26.0–34.0)
MCHC: 33.1 g/dL (ref 32.0–36.0)
MCV: 88.2 fL (ref 80.0–100.0)
MONO ABS: 0.7 10*3/uL (ref 0.2–1.0)
Monocytes Relative: 10 %
Neutro Abs: 4.5 10*3/uL (ref 1.4–6.5)
Neutrophils Relative %: 61 %
PLATELETS: 144 10*3/uL — AB (ref 150–440)
RBC: 5.35 MIL/uL (ref 4.40–5.90)
RDW: 14.9 % — AB (ref 11.5–14.5)
WBC: 7.3 10*3/uL (ref 3.8–10.6)

## 2017-02-13 MED ORDER — CEPHALEXIN 500 MG PO CAPS: 500.0000 mg | ORAL_CAPSULE | Freq: Two times a day (BID) | ORAL | 0 refills | Status: DC

## 2017-02-13 NOTE — ED Notes (Signed)
Patient states has not been on Xaralto for year and a half due to lack of insurance.

## 2017-02-13 NOTE — ED Triage Notes (Signed)
Patient presents with R ankle swelling and tenderness for a couple of days. Denies recent injury. HX DVT 2013 in R leg

## 2017-02-13 NOTE — ED Notes (Signed)
First nurse note   presents with swelling to right foot for couple of days  Also having some discomfort to right lower leg and some discolor noted to leg per pt  Hx of DVT

## 2017-02-13 NOTE — ED Notes (Signed)
Patient is dressed and ready to leave. Declined discharge VS at this time.

## 2017-02-13 NOTE — ED Provider Notes (Signed)
[facility] Emergency Department Provider Note   ____________________________________________    I have reviewed the triage vital signs and the nursing notes.   HISTORY  Chief Complaint Leg Swelling (right ankle/leg)     HPI [name] is a 41 y.o. male who presents with complaints of a burning sensation in the right ankle area along with some mild swelling.  Patient reports a history of DVTs in the past.  He also apparently had a pulmonary embolism.  He was supposed to be on Xarelto for life but he has not taken it.  He denies shortness of breath or pleurisy.  He is concerned primarily of infection today.  No fevers or chills but reports he has had infection at the right ankle the past which required antibiotics.  He has not followed up with vascular despite being told to do so   Past Medical History:  Diagnosis Date  . Cocaine abuse (HCC)   . DVT of leg (deep venous thrombosis) (HCC) 01/16/2012   right  . Family history of early CAD    Father deceased at 39 yo with MI  . PE (pulmonary embolism) 2011; 07/2011  . Syncope and collapse 01/16/2012   "woke up to dog licking my face" (01/16/2012)  . Tobacco abuse     Patient Active Problem List   Diagnosis Date Noted  . Cannabis use disorder, moderate, dependence (HCC) 11/24/2015  . MDD (major depressive disorder), recurrent severe, without psychosis (HCC) 11/24/2015  . Cocaine use disorder, severe, dependence (HCC) 11/22/2015  . Chest pain 01/23/2012  . Syncope 01/16/2012  . DVT (deep venous thrombosis) (HCC) 01/16/2012  . Long term (current) use of anticoagulants 08/16/2011  . TOBACCO USER 12/27/2008  . NONDEPENDENT COCAINE ABUSE EPISODIC 10/01/2008  . 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 NO PAST SURGERIES      Prior to Admission medications   Medication Sig Start Date End Date Taking? Authorizing Provider  cephALEXin (KEFLEX) 500 MG  capsule Take 1 capsule (500 mg total) by mouth 2 (two) times daily. 02/13/17   Jene EveryKinner, Idona Stach, MD  ibuprofen (ADVIL,MOTRIN) 200 MG tablet Take 4 tablets (800 mg total) by mouth every 6 (six) hours as needed for moderate pain. 11/25/15   Armandina StammerNwoko, Agnes I, NP  lisinopril (PRINIVIL,ZESTRIL) 10 MG tablet Take 1 tablet (10 mg total) by mouth daily. For high blood pressure 11/25/15   Nwoko, Nicole KindredAgnes I, NP  nicotine (NICODERM CQ - DOSED IN MG/24 HOURS) 21 mg/24hr patch Place 1 patch (21 mg total) onto the skin daily. For smoking cessation 11/25/15   Armandina StammerNwoko, Agnes I, NP  traZODone (DESYREL) 50 MG tablet Take 1 tablet (50 mg total) by mouth at bedtime. For insomnia 11/25/15   Nwoko, Nelda MarseilleAgnes I, NP  XARELTO STARTER PACK 15 & 20 MG TBPK Take 15-20 mg by mouth as directed. Take as directed on package: Start with one 15mg  tablet by mouth twice a day with food. On Day 22, switch to one 20mg  tablet once a day with food: Blood thinner to prevent blood clot 11/25/15   Sanjuana KavaNwoko, Agnes I, NP     Allergies Patient has no known allergies.  Family History  Problem Relation Age of Onset  . Coronary artery disease Father [id number]       MI at age 39  . Colon cancer Paternal Grandmother   . Cancer Maternal Grandfather     Social History Social History   Tobacco  Use  . Smoking status: Current Every Day Smoker    Packs/day: 0.50    Years: 5.00    Pack years: 2.50    Types: Cigarettes  . Smokeless tobacco: Never Used  Substance Use Topics  . Alcohol use: Yes    Comment: occasion  . Drug use: Yes    Types: Cocaine, Marijuana    Review of Systems  Constitutional: No fever/chills Eyes: No visual changes.  ENT: No sore throat. Cardiovascular: Denies chest pain. Respiratory: Denies shortness of breath. Gastrointestinal: No abdominal pain.  No nausea, no vomiting.   Genitourinary: Negative for dysuria. Musculoskeletal: Negative for back pain. Skin: Mild erythema right inner ankle Neurological: Negative for headaches or  weakness   ____________________________________________   PHYSICAL EXAM:  VITAL SIGNS: ED Triage Vitals [02/13/17 0851]  Enc Vitals Group     BP (!) 158/94     Pulse Rate 75     Resp 16     Temp 97.9 F (36.6 C)     Temp Source Oral     SpO2 97 %     Weight 108.9 kg (240 lb)     Height 1.956 m (6\' 5" )     Head Circumference      Peak Flow      Pain Score 2     Pain Loc      Pain Edu?      Excl. in GC?     Constitutional: Alert and oriented. No acute distress. Pleasant and interactive Eyes: Conjunctivae are normal.   Nose: No congestion/rhinnorhea. Mouth/Throat: Mucous membranes are moist.   Neck:  Painless ROM Cardiovascular: Normal rate, regular rhythm. Good peripheral circulation. Respiratory: Normal respiratory effort.  No retractions.  Gastrointestinal: Soft and nontender. No distention. Genitourinary: deferred Musculoskeletal: No significant edema lower extremity.  Warm and well perfused Neurologic:  Normal speech and language. No gross focal neurologic deficits are appreciated.  Skin:  Skin is warm, dry and intact.  Possibly mild erythema right medial malleolus, no fluctuance or skin break. Psychiatric: Mood and affect are normal. Speech and behavior are normal.  ____________________________________________   LABS (all labs ordered are listed, but only abnormal results are displayed)  Labs Reviewed  CBC WITH DIFFERENTIAL/PLATELET - Abnormal; Notable for the following components:      Result Value   RDW 14.9 (*)    Platelets 144 (*)    All other components within normal limits  COMPREHENSIVE METABOLIC PANEL - Abnormal; Notable for the following components:   Creatinine, Ser 1.31 (*)    Total Protein 6.3 (*)    Albumin 3.4 (*)    All other components within normal limits   ____________________________________________  EKG  None ____________________________________________  RADIOLOGY  Ultrasound shows chronic DVT unchanged over the last 2  years ____________________________________________   PROCEDURES  Procedure(s) performed: No  Procedures   Critical Care performed: No ____________________________________________   INITIAL IMPRESSION / ASSESSMENT AND PLAN / ED COURSE  Pertinent labs & imaging results that were available during my care of the patient were reviewed by me and considered in my medical decision making (see chart for details).  Patient presents with mild erythema right inner ankle, he has had infections in this area before because of some chronic skin changes.  Ultrasound shows chronic DVT, unchanged.  I recommended starting on Xarelto again and is following up with vascular surgeon but he said that he will not take the Xarelto and given that there is no change in his DVT I feel outpatient  follow-up with vascular is reasonable.  Will start Keflex for possible early infection.    ____________________________________________   FINAL CLINICAL IMPRESSION(S) / ED DIAGNOSES  Final diagnoses:  Chronic deep vein thrombosis (DVT) of femoral vein of right lower extremity (HCC)  Cellulitis of right lower extremity        Note:  This document was prepared using Dragon voice recognition software and may include unintentional dictation errors.    Jene Every, MD 02/13/17 [phone]

## 2017-06-15 ENCOUNTER — Other Ambulatory Visit: Payer: Self-pay

## 2017-06-15 ENCOUNTER — Emergency Department (HOSPITAL_BASED_OUTPATIENT_CLINIC_OR_DEPARTMENT_OTHER)
Admit: 2017-06-15 | Discharge: 2017-06-15 | Disposition: A | Discharge status: Home / Self Care | Payer: Self-pay | Attending: Emergency Medicine | Admitting: Emergency Medicine

## 2017-06-15 ENCOUNTER — Emergency Department (HOSPITAL_COMMUNITY)
Admission: EM | Admit: 2017-06-15 | Discharge: 2017-06-15 | Disposition: A | Discharge status: Home / Self Care | Payer: Self-pay | Attending: Emergency Medicine | Admitting: Emergency Medicine

## 2017-06-15 ENCOUNTER — Encounter (HOSPITAL_COMMUNITY): Payer: Self-pay | Admitting: Emergency Medicine

## 2017-06-15 DIAGNOSIS — Z7901 Long term (current) use of anticoagulants: Secondary | ICD-10-CM | POA: Insufficient documentation

## 2017-06-15 DIAGNOSIS — Z79899 Other long term (current) drug therapy: Secondary | ICD-10-CM | POA: Insufficient documentation

## 2017-06-15 DIAGNOSIS — M7989 Other specified soft tissue disorders: Secondary | ICD-10-CM

## 2017-06-15 DIAGNOSIS — F17210 Nicotine dependence, cigarettes, uncomplicated: Secondary | ICD-10-CM | POA: Insufficient documentation

## 2017-06-15 DIAGNOSIS — I82811 Embolism and thrombosis of superficial veins of right lower extremities: Secondary | ICD-10-CM | POA: Insufficient documentation

## 2017-06-15 DIAGNOSIS — M79609 Pain in unspecified limb: Secondary | ICD-10-CM

## 2017-06-15 DIAGNOSIS — R609 Edema, unspecified: Secondary | ICD-10-CM

## 2017-06-15 LAB — I-STAT CHEM 8, ED
BUN: 17 mg/dL (ref 6–20)
CHLORIDE: 103 mmol/L (ref 101–111)
Calcium, Ion: 1.13 mmol/L — ABNORMAL LOW (ref 1.15–1.40)
Creatinine, Ser: 1.5 mg/dL — ABNORMAL HIGH (ref 0.61–1.24)
Glucose, Bld: 103 mg/dL — ABNORMAL HIGH (ref 65–99)
HEMATOCRIT: 45 % (ref 39.0–52.0)
HEMOGLOBIN: 15.3 g/dL (ref 13.0–17.0)
POTASSIUM: 4 mmol/L (ref 3.5–5.1)
Sodium: 141 mmol/L (ref 135–145)
TCO2: 26 mmol/L (ref 22–32)

## 2017-06-15 MED ORDER — TRAMADOL HCL 50 MG PO TABS: 50.0000 mg | ORAL_TABLET | Freq: Four times a day (QID) | ORAL | 0 refills | Status: AC | PRN

## 2017-06-15 NOTE — ED Triage Notes (Signed)
Reports swelling in right leg from knee down.  Hx of dvt in the same leg in 2011.  Reports pain and difficulty bearing weight for last two days.

## 2017-06-15 NOTE — ED Notes (Signed)
Patient transported to Ultrasound 

## 2017-06-15 NOTE — ED Provider Notes (Addendum)
[facility] EMERGENCY DEPARTMENT Provider Note   CSN: [id number] Arrival date & time: 06/15/17  0000     History   Chief Complaint Chief Complaint  Patient presents with  . Leg Swelling    HPI [name] is a 42 y.o. male.  HPI   42 year old male with prior history of DVT, PE, currently not on any blood thinner medication presenting complaining of leg swelling.  Patient report for the past week he has noticed increasing pain and swelling to his right lower extremity.  Described pain as a throbbing achy sensation, persistent progressively worse.  No associated numbness with it no recent injury.  Pain felt similar to prior DVT.  He denies any recent surgery, prolonged bed rest, active cancer or hemoptysis.  No chest pain or shortness of breath, no fever or chills.  He has had PE and DVT in the past but states no identifiable cause were noted.  He works as a Administrator.  Past Medical History:  Diagnosis Date  . Cocaine abuse (HCC)   . DVT of leg (deep venous thrombosis) (HCC) 01/16/2012   right  . Family history of early CAD    Father deceased at 60 yo with MI  . PE (pulmonary embolism) 2011; 07/2011  . Syncope and collapse 01/16/2012   "woke up to dog licking my face" (01/16/2012)  . Tobacco abuse     Patient Active Problem List   Diagnosis Date Noted  . Cannabis use disorder, moderate, dependence (HCC) 11/24/2015  . MDD (major depressive disorder), recurrent severe, without psychosis (HCC) 11/24/2015  . Cocaine use disorder, severe, dependence (HCC) 11/22/2015  . Chest pain 01/23/2012  . Syncope 01/16/2012  . DVT (deep venous thrombosis) (HCC) 01/16/2012  . Long term (current) use of anticoagulants 08/16/2011  . TOBACCO USER 12/27/2008  . NONDEPENDENT COCAINE ABUSE EPISODIC 10/01/2008  . 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 NO PAST SURGERIES          Home Medications    Prior to Admission  medications   Medication Sig Start Date End Date Taking? Authorizing Provider  cephALEXin (KEFLEX) 500 MG capsule Take 1 capsule (500 mg total) by mouth 2 (two) times daily. 02/13/17   Jene Every, MD  ibuprofen (ADVIL,MOTRIN) 200 MG tablet Take 4 tablets (800 mg total) by mouth every 6 (six) hours as needed for moderate pain. 11/25/15   Armandina Stammer I, NP  lisinopril (PRINIVIL,ZESTRIL) 10 MG tablet Take 1 tablet (10 mg total) by mouth daily. For high blood pressure 11/25/15   Nwoko, Nicole Kindred I, NP  nicotine (NICODERM CQ - DOSED IN MG/24 HOURS) 21 mg/24hr patch Place 1 patch (21 mg total) onto the skin daily. For smoking cessation 11/25/15   Armandina Stammer I, NP  traZODone (DESYREL) 50 MG tablet Take 1 tablet (50 mg total) by mouth at bedtime. For insomnia 11/25/15   Nwoko, Nelda Marseille, NP  XARELTO STARTER PACK 15 & 20 MG TBPK Take 15-20 mg by mouth as directed. Take as directed on package: Start with one 15mg  tablet by mouth twice a day with food. On Day 22, switch to one 20mg  tablet once a day with food: Blood thinner to prevent blood clot 11/25/15   Sanjuana Kava, NP    Family History Family History  Problem Relation Age of Onset  . Coronary artery disease Father 70       MI at age 3  . Colon cancer Paternal  Grandmother   . Cancer Maternal Grandfather     Social History Social History   Tobacco Use  . Smoking status: Current Every Day Smoker    Packs/day: 0.50    Years: 5.00    Pack years: 2.50    Types: Cigarettes  . Smokeless tobacco: Never Used  Substance Use Topics  . Alcohol use: Yes    Comment: occasion  . Drug use: Yes    Types: Cocaine, Marijuana     Allergies   Patient has no known allergies.   Review of Systems Review of Systems  Constitutional: Negative for fever.  Respiratory: Negative for shortness of breath.   Cardiovascular: Negative for chest pain.  Neurological: Negative for numbness.  All other systems reviewed and are negative.    Physical Exam Updated  Vital Signs BP 130/85 (BP Location: Left Arm)   Pulse 88   Temp 98.1 F (36.7 C)   Resp 16   Ht 6\' 5"  (1.956 m)   Wt 113.4 kg (250 lb)   SpO2 96%   BMI 29.65 kg/m   Physical Exam  Constitutional: He appears well-developed and well-nourished. No distress.  HENT:  Head: Atraumatic.  Eyes: Conjunctivae are normal.  Neck: Neck supple.  Cardiovascular: Normal rate and regular rhythm.  Pulmonary/Chest: Effort normal and breath sounds normal.  Musculoskeletal: He exhibits edema (Right lower extremity: 1+ pitting edema extending to the mid tib-fib region, tender to palpation.  Leg compartment is soft, intact dorsalis pedis pulse.).  Neurological: He is alert.  Skin: No rash noted.  Psychiatric: He has a normal mood and affect.  Nursing note and vitals reviewed.    ED Treatments / Results  Labs (all labs ordered are listed, but only abnormal results are displayed) Labs Reviewed  I-STAT CHEM 8, ED - Abnormal; Notable for the following components:      Result Value   Creatinine, Ser 1.50 (*)    Glucose, Bld 103 (*)    Calcium, Ion 1.13 (*)    All other components within normal limits    EKG None  Radiology No results found.  Procedures Procedures (including critical care time)  Preliminary notes by tech--Right lower extremity venous study completed. Negative for deep veins thrombosis, positive for superficial veins thrombosis at distal calf region.  Result notified RN Holyden by phone.   Hongying Cole(RDMS RVT) 06/15/17 10:49 AM     Medications Ordered in ED Medications - No data to display   Initial Impression / Assessment and Plan / ED Course  I have reviewed the triage vital signs and the nursing notes.  Pertinent labs & imaging results that were available during my care of the patient were reviewed by me and considered in my medical decision making (see chart for details).     BP 130/85 (BP Location: Left Arm)   Pulse 88   Temp 98.1 F (36.7 C)    Resp 16   Ht 6\' 5"  (1.956 m)   Wt 113.4 kg (250 lb)   SpO2 96%   BMI 29.65 kg/m    Final Clinical Impressions(s) / ED Diagnoses   Final diagnoses:  Embolism and thrombosis of superficial vein of right lower extremity    ED Discharge Orders        Ordered    traMADol (ULTRAM) 50 MG tablet  Every 6 hours PRN     06/15/17 1133     8:30 AM Patient with history of known DVT in the past not on blood thinner medication here  with progressive worsening pain and swelling to his right lower extremity concerning for another DVT.  Will obtain venous Doppler ultrasound for further evaluation.  10:52 AM Right lower extremity Doppler study was obtained.  Preliminary notes by tech indicate that there or evidence of superficial vein thrombosis at the distal calf region but negative for DVT.  Given this finding, I recommend patient to use warm compress, will prescribe pain medication to go home, return precautions discussed. With impaired renal function, will not prescribe NSAIDs. Care discussed with Dr. Donnald Garre. In order to decrease risk of narcotic abuse. Pt's record were checked using the [place] Controlled Substance database.      Fayrene Helper, PA-C 06/15/17 1134    Arby Barrette, MD 06/24/17 [phone]

## 2017-06-15 NOTE — Progress Notes (Signed)
Preliminary notes by tech--Right lower extremity venous study completed. Negative for deep veins thrombosis, positive for superficial veins thrombosis at distal calf region.  Result notified RN Holyden by phone.   Hongying Linsay Vogt(RDMS RVT) 06/15/17 10:49 AM

## 2017-06-15 NOTE — Discharge Instructions (Signed)
You have been diagnosed with having a superficial venous thrombosis.  This is different from a deep vein thrombosis.  Please apply warm compress to affected area several times daily for the next week.  Take tramadol as needed for pain.  Keep your leg elevated.  Return if you have any concerns.

## 2017-10-14 ENCOUNTER — Emergency Department (HOSPITAL_COMMUNITY): Payer: No Typology Code available for payment source

## 2017-10-14 ENCOUNTER — Other Ambulatory Visit: Payer: Self-pay

## 2017-10-14 ENCOUNTER — Encounter (HOSPITAL_COMMUNITY): Payer: Self-pay | Admitting: Emergency Medicine

## 2017-10-14 ENCOUNTER — Emergency Department (HOSPITAL_COMMUNITY)
Admission: EM | Admit: 2017-10-14 | Discharge: 2017-10-14 | Disposition: A | Discharge status: Home / Self Care | Payer: No Typology Code available for payment source | Attending: Emergency Medicine | Admitting: Emergency Medicine

## 2017-10-14 DIAGNOSIS — M545 Low back pain, unspecified: Secondary | ICD-10-CM

## 2017-10-14 DIAGNOSIS — Y999 Unspecified external cause status: Secondary | ICD-10-CM | POA: Insufficient documentation

## 2017-10-14 DIAGNOSIS — Y9389 Activity, other specified: Secondary | ICD-10-CM | POA: Diagnosis not present

## 2017-10-14 DIAGNOSIS — M542 Cervicalgia: Secondary | ICD-10-CM

## 2017-10-14 DIAGNOSIS — Y92410 Unspecified street and highway as the place of occurrence of the external cause: Secondary | ICD-10-CM | POA: Insufficient documentation

## 2017-10-14 DIAGNOSIS — F17210 Nicotine dependence, cigarettes, uncomplicated: Secondary | ICD-10-CM | POA: Insufficient documentation

## 2017-10-14 MED ORDER — ACETAMINOPHEN 500 MG PO TABS: 500.0000 mg | ORAL_TABLET | Freq: Four times a day (QID) | ORAL | 0 refills | Status: AC | PRN

## 2017-10-14 MED ORDER — PREDNISONE 10 MG (21) PO TBPK: ORAL_TABLET | Freq: Every day | ORAL | 0 refills | Status: AC
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
## 2017-10-14 NOTE — ED Notes (Signed)
Patient transported to X-ray 

## 2017-10-14 NOTE — Discharge Instructions (Addendum)
Your x-rays today showed mild arthritis but no signs of fracture.   1. Medications: Take the steroid pack as prescribed.  Do not take ibuprofen, Advil, Aleve, or Motrin while taking this medication.  You may also take [phone] mg of Tylenol every 6 hours additionally as needed for pain. Do not exceed 4000 mg of Tylenol daily.  Take steroid taper with food to avoid upset stomach issues.   2. Treatment: rest, drink plenty of fluids, gentle stretching as discussed (see attached), alternate ice and heat (or stick with whichever feels best) 20 minutes on 20 minutes off. 3. Follow Up: Please followup with your primary doctor in 3-7 days for discussion of your diagnoses and further evaluation after today's visit; if you do not have a primary care doctor use the resource guide provided to find one;  Return to the ER for worsening back pain, difficulty walking, loss of bowel or bladder control or other concerning symptoms
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
## 2017-10-14 NOTE — ED Notes (Signed)
Declined W/C at D/C and was escorted to lobby by RN. 

## 2020-02-19 ENCOUNTER — Inpatient Hospital Stay (HOSPITAL_COMMUNITY): Payer: Self-pay

## 2020-02-19 ENCOUNTER — Emergency Department (HOSPITAL_COMMUNITY): Payer: Self-pay

## 2020-02-19 ENCOUNTER — Inpatient Hospital Stay (HOSPITAL_COMMUNITY)
Admission: EM | Admit: 2020-02-19 | Discharge: 2020-03-21 | DRG: 917 | Disposition: E | Payer: Self-pay | Attending: Internal Medicine | Admitting: Internal Medicine

## 2020-02-19 DIAGNOSIS — R9089 Other abnormal findings on diagnostic imaging of central nervous system: Principal | ICD-10-CM

## 2020-02-19 DIAGNOSIS — J9600 Acute respiratory failure, unspecified whether with hypoxia or hypercapnia: Secondary | ICD-10-CM

## 2020-02-19 DIAGNOSIS — N170 Acute kidney failure with tubular necrosis: Secondary | ICD-10-CM

## 2020-02-19 DIAGNOSIS — G928 Other toxic encephalopathy: Secondary | ICD-10-CM

## 2020-02-19 DIAGNOSIS — J9690 Respiratory failure, unspecified, unspecified whether with hypoxia or hypercapnia: Secondary | ICD-10-CM | POA: Diagnosis present

## 2020-02-19 DIAGNOSIS — E872 Acidosis: Secondary | ICD-10-CM

## 2020-02-19 DIAGNOSIS — I6340 Cerebral infarction due to embolism of unspecified cerebral artery: Secondary | ICD-10-CM | POA: Insufficient documentation

## 2020-02-19 DIAGNOSIS — R410 Disorientation, unspecified: Secondary | ICD-10-CM | POA: Diagnosis present

## 2020-02-19 DIAGNOSIS — I63449 Cerebral infarction due to embolism of unspecified cerebellar artery: Secondary | ICD-10-CM | POA: Diagnosis present

## 2020-02-19 DIAGNOSIS — Z515 Encounter for palliative care: Secondary | ICD-10-CM

## 2020-02-19 DIAGNOSIS — F17200 Nicotine dependence, unspecified, uncomplicated: Secondary | ICD-10-CM | POA: Diagnosis present

## 2020-02-19 DIAGNOSIS — F1420 Cocaine dependence, uncomplicated: Secondary | ICD-10-CM | POA: Diagnosis present

## 2020-02-19 DIAGNOSIS — I959 Hypotension, unspecified: Secondary | ICD-10-CM | POA: Diagnosis present

## 2020-02-19 DIAGNOSIS — E875 Hyperkalemia: Secondary | ICD-10-CM | POA: Diagnosis not present

## 2020-02-19 DIAGNOSIS — G931 Anoxic brain damage, not elsewhere classified: Secondary | ICD-10-CM | POA: Diagnosis present

## 2020-02-19 DIAGNOSIS — R1310 Dysphagia, unspecified: Secondary | ICD-10-CM | POA: Diagnosis present

## 2020-02-19 DIAGNOSIS — I611 Nontraumatic intracerebral hemorrhage in hemisphere, cortical: Secondary | ICD-10-CM | POA: Diagnosis present

## 2020-02-19 DIAGNOSIS — E669 Obesity, unspecified: Secondary | ICD-10-CM | POA: Diagnosis present

## 2020-02-19 DIAGNOSIS — I639 Cerebral infarction, unspecified: Secondary | ICD-10-CM

## 2020-02-19 DIAGNOSIS — M6282 Rhabdomyolysis: Secondary | ICD-10-CM | POA: Diagnosis present

## 2020-02-19 DIAGNOSIS — N179 Acute kidney failure, unspecified: Secondary | ICD-10-CM | POA: Diagnosis present

## 2020-02-19 DIAGNOSIS — I214 Non-ST elevation (NSTEMI) myocardial infarction: Secondary | ICD-10-CM | POA: Diagnosis present

## 2020-02-19 DIAGNOSIS — Z6831 Body mass index (BMI) 31.0-31.9, adult: Secondary | ICD-10-CM

## 2020-02-19 DIAGNOSIS — R34 Anuria and oliguria: Secondary | ICD-10-CM | POA: Diagnosis present

## 2020-02-19 DIAGNOSIS — Z86711 Personal history of pulmonary embolism: Secondary | ICD-10-CM

## 2020-02-19 DIAGNOSIS — I201 Angina pectoris with documented spasm: Secondary | ICD-10-CM | POA: Diagnosis present

## 2020-02-19 DIAGNOSIS — J9601 Acute respiratory failure with hypoxia: Secondary | ICD-10-CM | POA: Diagnosis present

## 2020-02-19 DIAGNOSIS — Z8249 Family history of ischemic heart disease and other diseases of the circulatory system: Secondary | ICD-10-CM

## 2020-02-19 DIAGNOSIS — J690 Pneumonitis due to inhalation of food and vomit: Secondary | ICD-10-CM | POA: Diagnosis present

## 2020-02-19 DIAGNOSIS — D6862 Lupus anticoagulant syndrome: Secondary | ICD-10-CM | POA: Diagnosis present

## 2020-02-19 DIAGNOSIS — Z86718 Personal history of other venous thrombosis and embolism: Secondary | ICD-10-CM

## 2020-02-19 DIAGNOSIS — Z66 Do not resuscitate: Secondary | ICD-10-CM | POA: Diagnosis not present

## 2020-02-19 DIAGNOSIS — T405X1A Poisoning by cocaine, accidental (unintentional), initial encounter: Principal | ICD-10-CM | POA: Diagnosis present

## 2020-02-19 DIAGNOSIS — Z20822 Contact with and (suspected) exposure to covid-19: Secondary | ICD-10-CM | POA: Diagnosis present

## 2020-02-19 DIAGNOSIS — K7200 Acute and subacute hepatic failure without coma: Secondary | ICD-10-CM | POA: Diagnosis present

## 2020-02-19 LAB — RAPID URINE DRUG SCREEN, HOSP PERFORMED
Amphetamines: NOT DETECTED
Barbiturates: NOT DETECTED
Benzodiazepines: NOT DETECTED
Cocaine: POSITIVE — AB
Opiates: NOT DETECTED
Tetrahydrocannabinol: NOT DETECTED

## 2020-02-19 LAB — I-STAT ARTERIAL BLOOD GAS, ED
Acid-base deficit: 8 mmol/L — ABNORMAL HIGH (ref 0.0–2.0)
Bicarbonate: 18.1 mmol/L — ABNORMAL LOW (ref 20.0–28.0)
Calcium, Ion: 1.07 mmol/L — ABNORMAL LOW (ref 1.15–1.40)
HCT: 48 % (ref 39.0–52.0)
Hemoglobin: 16.3 g/dL (ref 13.0–17.0)
O2 Saturation: 99 %
Potassium: 5.9 mmol/L — ABNORMAL HIGH (ref 3.5–5.1)
Sodium: 142 mmol/L (ref 135–145)
TCO2: 19 mmol/L — ABNORMAL LOW (ref 22–32)
pCO2 arterial: 37.2 mmHg (ref 32.0–48.0)
pH, Arterial: 7.296 — ABNORMAL LOW (ref 7.350–7.450)
pO2, Arterial: 133 mmHg — ABNORMAL HIGH (ref 83.0–108.0)

## 2020-02-19 LAB — CBC
HCT: 49 % (ref 39.0–52.0)
Hemoglobin: 15.6 g/dL (ref 13.0–17.0)
MCH: 29.2 pg (ref 26.0–34.0)
MCHC: 31.8 g/dL (ref 30.0–36.0)
MCV: 91.8 fL (ref 80.0–100.0)
Platelets: 120 10*3/uL — ABNORMAL LOW (ref 150–400)
RBC: 5.34 MIL/uL (ref 4.22–5.81)
RDW: 15.2 % (ref 11.5–15.5)
WBC: 10.5 10*3/uL (ref 4.0–10.5)
nRBC: 0.3 % — ABNORMAL HIGH (ref 0.0–0.2)

## 2020-02-19 LAB — CBC WITH DIFFERENTIAL/PLATELET
Abs Immature Granulocytes: 0 10*3/uL (ref 0.00–0.07)
Basophils Absolute: 0 10*3/uL (ref 0.0–0.1)
Basophils Relative: 0 %
Eosinophils Absolute: 0 10*3/uL (ref 0.0–0.5)
Eosinophils Relative: 0 %
HCT: 52.7 % — ABNORMAL HIGH (ref 39.0–52.0)
Hemoglobin: 17.2 g/dL — ABNORMAL HIGH (ref 13.0–17.0)
Lymphocytes Relative: 15 %
Lymphs Abs: 2.2 10*3/uL (ref 0.7–4.0)
MCH: 29.7 pg (ref 26.0–34.0)
MCHC: 32.6 g/dL (ref 30.0–36.0)
MCV: 90.9 fL (ref 80.0–100.0)
Monocytes Absolute: 0.9 10*3/uL (ref 0.1–1.0)
Monocytes Relative: 6 %
Neutro Abs: 11.6 10*3/uL — ABNORMAL HIGH (ref 1.7–7.7)
Neutrophils Relative %: 79 %
Platelets: 153 10*3/uL (ref 150–400)
RBC: 5.8 MIL/uL (ref 4.22–5.81)
RDW: 15.2 % (ref 11.5–15.5)
WBC: 14.7 10*3/uL — ABNORMAL HIGH (ref 4.0–10.5)
nRBC: 0.9 % — ABNORMAL HIGH (ref 0.0–0.2)
nRBC: 5 /100 WBC — ABNORMAL HIGH

## 2020-02-19 LAB — URINALYSIS, ROUTINE W REFLEX MICROSCOPIC
Bacteria, UA: NONE SEEN
Bilirubin Urine: NEGATIVE
Glucose, UA: NEGATIVE mg/dL
Ketones, ur: NEGATIVE mg/dL
Leukocytes,Ua: NEGATIVE
Nitrite: NEGATIVE
Protein, ur: 100 mg/dL — AB
Specific Gravity, Urine: 1.006 (ref 1.005–1.030)
pH: 6 (ref 5.0–8.0)

## 2020-02-19 LAB — MAGNESIUM: Magnesium: 1.7 mg/dL (ref 1.7–2.4)

## 2020-02-19 LAB — CBG MONITORING, ED
Glucose-Capillary: 123 mg/dL — ABNORMAL HIGH (ref 70–99)
Glucose-Capillary: 112 mg/dL — ABNORMAL HIGH (ref 70–99)

## 2020-02-19 LAB — LACTIC ACID, PLASMA
Lactic Acid, Venous: 7.2 mmol/L (ref 0.5–1.9)
Lactic Acid, Venous: 4.7 mmol/L (ref 0.5–1.9)
Lactic Acid, Venous: 4.1 mmol/L (ref 0.5–1.9)

## 2020-02-19 LAB — COMPREHENSIVE METABOLIC PANEL
ALT: 2636 U/L — ABNORMAL HIGH (ref 0–44)
AST: 2100 U/L — ABNORMAL HIGH (ref 15–41)
Albumin: 3.1 g/dL — ABNORMAL LOW (ref 3.5–5.0)
Alkaline Phosphatase: 70 U/L (ref 38–126)
Anion gap: 18 — ABNORMAL HIGH (ref 5–15)
BUN: 32 mg/dL — ABNORMAL HIGH (ref 6–20)
CO2: 19 mmol/L — ABNORMAL LOW (ref 22–32)
Calcium: 8 mg/dL — ABNORMAL LOW (ref 8.9–10.3)
Chloride: 110 mmol/L (ref 98–111)
Creatinine, Ser: 5.08 mg/dL — ABNORMAL HIGH (ref 0.61–1.24)
GFR, Estimated: 14 mL/min — ABNORMAL LOW (ref >60)
Glucose, Bld: 109 mg/dL — ABNORMAL HIGH (ref 70–99)
Potassium: 5.7 mmol/L — ABNORMAL HIGH (ref 3.5–5.1)
Sodium: 147 mmol/L — ABNORMAL HIGH (ref 135–145)
Total Bilirubin: 0.8 mg/dL (ref 0.3–1.2)
Total Protein: 5.3 g/dL — ABNORMAL LOW (ref 6.5–8.1)

## 2020-02-19 LAB — APTT: aPTT: 28 seconds (ref 24–36)

## 2020-02-19 LAB — HIV ANTIBODY (ROUTINE TESTING W REFLEX): HIV Screen 4th Generation wRfx: NONREACTIVE

## 2020-02-19 LAB — TROPONIN I (HIGH SENSITIVITY)
Troponin I (High Sensitivity): 14659 ng/L (ref <18)
Troponin I (High Sensitivity): 11375 ng/L (ref <18)
Troponin I (High Sensitivity): 17399 ng/L (ref <18)

## 2020-02-19 LAB — RESP PANEL BY RT-PCR (FLU A&B, COVID) ARPGX2
Influenza A by PCR: NEGATIVE
Influenza B by PCR: NEGATIVE
SARS Coronavirus 2 by RT PCR: NEGATIVE

## 2020-02-19 LAB — HEMOGLOBIN A1C
Hgb A1c MFr Bld: 5.6 % (ref 4.8–5.6)
Mean Plasma Glucose: 114.02 mg/dL

## 2020-02-19 LAB — GLUCOSE, CAPILLARY: Glucose-Capillary: 116 mg/dL — ABNORMAL HIGH (ref 70–99)

## 2020-02-19 LAB — CK: Total CK: 2152 U/L — ABNORMAL HIGH (ref 49–397)

## 2020-02-19 LAB — ETHANOL: Alcohol, Ethyl (B): <10 mg/dL (ref <10)

## 2020-02-19 LAB — ACETAMINOPHEN LEVEL: Acetaminophen (Tylenol), Serum: <10 ug/mL — ABNORMAL LOW (ref 10–30)

## 2020-02-19 LAB — SALICYLATE LEVEL: Salicylate Lvl: <7 mg/dL — ABNORMAL LOW (ref 7.0–30.0)

## 2020-02-19 LAB — CREATININE, SERUM
Creatinine, Ser: 5.58 mg/dL — ABNORMAL HIGH (ref 0.61–1.24)
GFR, Estimated: 12 mL/min — ABNORMAL LOW (ref >60)

## 2020-02-19 LAB — PROTIME-INR
INR: 1.3 — ABNORMAL HIGH (ref 0.8–1.2)
Prothrombin Time: 15.6 seconds — ABNORMAL HIGH (ref 11.4–15.2)

## 2020-02-19 LAB — LIPASE, BLOOD: Lipase: 26 U/L (ref 11–51)

## 2020-02-19 MED ORDER — SODIUM CHLORIDE 0.9 % IV SOLN
Freq: Once | INTRAVENOUS | Status: AC
  Administered 2020-02-19: 1000 mL/h via INTRAVENOUS

## 2020-02-19 MED ORDER — ORAL CARE MOUTH RINSE
15.0000 mL | OROMUCOSAL | Status: DC
  Administered 2020-02-19 – 2020-02-25 (×54): 15 mL via OROMUCOSAL

## 2020-02-19 MED ORDER — MIDAZOLAM HCL 2 MG/2ML IJ SOLN
INTRAMUSCULAR | Status: AC
  Filled 2020-02-19: qty 4

## 2020-02-19 MED ORDER — ACETAMINOPHEN 160 MG/5ML PO SOLN
650.0000 mg | ORAL | Status: DC
  Administered 2020-02-20: 650 mg
  Filled 2020-02-19: qty 20.3

## 2020-02-19 MED ORDER — MIDAZOLAM HCL 2 MG/2ML IJ SOLN
INTRAMUSCULAR | Status: AC
  Administered 2020-02-19: 2 mg via INTRAVENOUS
  Filled 2020-02-19: qty 2

## 2020-02-19 MED ORDER — INSULIN ASPART 100 UNIT/ML ~~LOC~~ SOLN
0.0000 [IU] | SUBCUTANEOUS | Status: DC
  Administered 2020-02-22: 1 [IU] via SUBCUTANEOUS

## 2020-02-19 MED ORDER — LACTATED RINGERS IV BOLUS
1000.0000 mL | Freq: Once | INTRAVENOUS | Status: AC
  Administered 2020-02-19: 1000 mL via INTRAVENOUS

## 2020-02-19 MED ORDER — SODIUM ZIRCONIUM CYCLOSILICATE 5 G PO PACK
10.0000 g | PACK | Freq: Once | ORAL | Status: AC
  Administered 2020-02-19: 10 g
  Filled 2020-02-19: qty 2

## 2020-02-19 MED ORDER — PROPOFOL 1000 MG/100ML IV EMUL
5.0000 ug/kg/min | INTRAVENOUS | Status: DC
  Administered 2020-02-19: 30 ug/kg/min via INTRAVENOUS

## 2020-02-19 MED ORDER — MIDAZOLAM HCL 2 MG/2ML IJ SOLN: 2.0000 mg | Freq: Once | INTRAMUSCULAR | Status: AC

## 2020-02-19 MED ORDER — ASPIRIN 81 MG PO CHEW: 81.0000 mg | CHEWABLE_TABLET | ORAL | Status: DC

## 2020-02-19 MED ORDER — PROPOFOL 1000 MG/100ML IV EMUL
INTRAVENOUS | Status: AC
  Administered 2020-02-19: 58.48 ug/kg/min via INTRAVENOUS
  Filled 2020-02-19: qty 100

## 2020-02-19 MED ORDER — FENTANYL 2500MCG IN NS 250ML (10MCG/ML) PREMIX INFUSION
0.0000 ug/h | INTRAVENOUS | Status: DC
  Administered 2020-02-19: 25 ug/h via INTRAVENOUS
  Administered 2020-02-20: 125 ug/h via INTRAVENOUS
  Filled 2020-02-19 (×2): qty 250

## 2020-02-19 MED ORDER — HEPARIN SODIUM (PORCINE) 5000 UNIT/ML IJ SOLN
5000.0000 [IU] | Freq: Three times a day (TID) | INTRAMUSCULAR | Status: DC
  Administered 2020-02-19 – 2020-02-20 (×3): 5000 [IU] via SUBCUTANEOUS
  Filled 2020-02-19 (×3): qty 1

## 2020-02-19 MED ORDER — FENTANYL CITRATE (PF) 100 MCG/2ML IJ SOLN
INTRAMUSCULAR | Status: AC
  Filled 2020-02-19: qty 2

## 2020-02-19 MED ORDER — CHLORHEXIDINE GLUCONATE 0.12% ORAL RINSE (MEDLINE KIT)
15.0000 mL | Freq: Two times a day (BID) | OROMUCOSAL | Status: DC
  Administered 2020-02-19 – 2020-02-22 (×6): 15 mL via OROMUCOSAL

## 2020-02-19 MED ORDER — ETOMIDATE 2 MG/ML IV SOLN
INTRAVENOUS | Status: AC | PRN
  Administered 2020-02-19: 20 mg via INTRAVENOUS

## 2020-02-19 MED ORDER — ETOMIDATE 2 MG/ML IV SOLN
INTRAVENOUS | Status: AC
  Filled 2020-02-19: qty 20

## 2020-02-19 MED ORDER — DOCUSATE SODIUM 100 MG PO CAPS: 100.0000 mg | ORAL_CAPSULE | Freq: Two times a day (BID) | ORAL | Status: DC | PRN

## 2020-02-19 MED ORDER — ACETAMINOPHEN 650 MG RE SUPP
650.0000 mg | RECTAL | Status: DC
  Administered 2020-02-19 (×2): 650 mg via RECTAL
  Filled 2020-02-19 (×2): qty 1

## 2020-02-19 MED ORDER — SUCCINYLCHOLINE CHLORIDE 20 MG/ML IJ SOLN
INTRAMUSCULAR | Status: AC | PRN
  Administered 2020-02-19: 200 mg via INTRAVENOUS

## 2020-02-19 MED ORDER — CHLORHEXIDINE GLUCONATE CLOTH 2 % EX PADS
6.0000 | MEDICATED_PAD | Freq: Every day | CUTANEOUS | Status: DC
  Administered 2020-02-19 – 2020-02-22 (×3): 6 via TOPICAL

## 2020-02-19 MED ORDER — LACTATED RINGERS IV SOLN
INTRAVENOUS | Status: DC
  Administered 2020-02-20: 200 mL/h via INTRAVENOUS

## 2020-02-19 MED ORDER — ACETAMINOPHEN 325 MG PO TABS: 650.0000 mg | ORAL_TABLET | ORAL | Status: DC

## 2020-02-19 MED ORDER — POLYETHYLENE GLYCOL 3350 17 G PO PACK: 17.0000 g | PACK | Freq: Every day | ORAL | Status: DC | PRN

## 2020-02-19 MED ORDER — PIPERACILLIN-TAZOBACTAM 3.375 G IVPB
3.3750 g | Freq: Once | INTRAVENOUS | Status: DC
  Administered 2020-02-19: 3.375 g via INTRAVENOUS
  Filled 2020-02-19: qty 50

## 2020-02-19 MED ORDER — ROCURONIUM BROMIDE 10 MG/ML (PF) SYRINGE
PREFILLED_SYRINGE | INTRAVENOUS | Status: AC
  Filled 2020-02-19: qty 10

## 2020-02-19 MED ORDER — MIDAZOLAM HCL 2 MG/2ML IJ SOLN
2.0000 mg | Freq: Once | INTRAMUSCULAR | Status: AC
  Administered 2020-02-20: 2 mg via INTRAVENOUS
  Filled 2020-02-19: qty 2

## 2020-02-19 MED ORDER — MIDAZOLAM 50MG/50ML (1MG/ML) PREMIX INFUSION
2.0000 mg/h | INTRAVENOUS | Status: DC
  Administered 2020-02-19: 0.5 mg/h via INTRAVENOUS
  Filled 2020-02-19 (×2): qty 50

## 2020-02-19 MED ORDER — SODIUM CHLORIDE 0.9 % IV SOLN
3.0000 g | Freq: Two times a day (BID) | INTRAVENOUS | Status: DC
  Administered 2020-02-20: 3 g via INTRAVENOUS
  Filled 2020-02-19 (×3): qty 8

## 2020-02-19 MED ORDER — PROPOFOL 1000 MG/100ML IV EMUL: 5.0000 ug/kg/min | INTRAVENOUS | Status: DC

## 2020-02-19 MED ORDER — PANTOPRAZOLE SODIUM 40 MG PO TBEC: 40.0000 mg | DELAYED_RELEASE_TABLET | Freq: Every day | ORAL | Status: DC

## 2020-02-19 NOTE — ED Notes (Signed)
Pt remains calm. No need to titrate medications

## 2020-02-19 NOTE — Progress Notes (Signed)
Transported to CT on vent, no issues.

## 2020-02-19 NOTE — Progress Notes (Signed)
eLink Physician-Brief Progress Note Patient Name: [name] DOB: January 03, 1976 MRN: [id number]   Date of Service  03/01/2020  HPI/Events of Note  Hyperkalemia - K+ = 5.9.  eICU Interventions  Plan: 1. Lokelma 10 gm per tube now. 2. Repeat BMP at 12 midnight.     Intervention Category Major Interventions: Electrolyte abnormality - evaluation and management  Reilyn Nelson Eugene 03/13/2020, 7:32 PM

## 2020-02-19 NOTE — ED Triage Notes (Signed)
Pt found unresponsive on a bed in a motel. 4mg  narcan given total en route, pt being bagged on arrival to ED. Diaphoretic.

## 2020-02-19 NOTE — ED Notes (Signed)
Help get patient on the monitor patient is resting with nurse at bedside 

## 2020-02-19 NOTE — Progress Notes (Signed)
Pt transported from ED to 2M03 without any complications.

## 2020-02-19 NOTE — ED Notes (Signed)
Pt more comfortable

## 2020-02-19 NOTE — ED Notes (Signed)
Pt back from CT

## 2020-02-19 NOTE — ED Notes (Signed)
Propofol at max rate. Pt remained tachypneic and tachycardia. Dr. [name] notified via chat

## 2020-02-19 NOTE — ED Notes (Signed)
Provider at bedside

## 2020-02-19 NOTE — ED Notes (Signed)
Date and time results received: 02-24-2020 1530 (use smartphrase ".now" to insert current time)  Test: trop Critical Value: trop  Name of Provider Notified: Dr. [name] Orders Received? Or Actions Taken?:no new orders

## 2020-02-19 NOTE — ED Notes (Signed)
Date and time results received: 03/19/2020 1410   Test: Lactic acid Critical Value: 7.2  Name of Provider Notified: Dr. [name]  Orders Received? Or Actions Taken?: standing order

## 2020-02-19 NOTE — Progress Notes (Signed)
Pharmacy Antibiotic Note  [name] is a 44 y.o. male admitted on 02/24/2020 with Aspiration PNA.  Pharmacy has been consulted for Unasyn dosing.  Height: 6\' 2"  (188 cm) Weight: 114 kg (251 lb 5.2 oz) IBW/kg (Calculated) : 82.2  Temp (24hrs), Avg:100.5 F (38.1 C), Min:100 F (37.8 C), Max:100.6 F (38.1 C)  Recent Labs  Lab 02/22/2020 1303  WBC 14.7*  CREATININE 5.08*  LATICACIDVEN 7.2*    Estimated Creatinine Clearance: 25.4 mL/min (A) (by C-G formula based on SCr of 5.08 mg/dL (H)).    Not on File  Antimicrobials this admission: 12/1 Unasyn >>    Dose adjustments this admission: N/a  Microbiology results: Pending  Plan:  - Unasyn 3g IV q12h  - Monitor renal function and urine output   Thank you for allowing pharmacy to be a part of this patient's care.  14/1 PharmD. BCPS  03/17/2020 5:02 PM

## 2020-02-19 NOTE — ED Notes (Signed)
Pt more comfortable, not "fighting the vent".

## 2020-02-19 NOTE — ED Notes (Signed)
Intubated 27 at the lip by dr [name]

## 2020-02-19 NOTE — H&P (Signed)
NAME:  [name], MRN:  [id number], DOB:  05/23/1975, LOS: 0 ADMISSION DATE:  Mar 15, 2020,  REFERRING MD: Dr. [name],  CHIEF COMPLAINT: Unresponsive, acute respiratory failure  Brief History     History of present illness    44 year old male with a history of polysubstance abuse including cocaine and tobacco, right lower extremity DVT/PE (2011, 2013) with lupus anticoagulant.  He was apparently found on 12/1 unresponsive in the bed in a motel room.  Unclear when he was last seen normal.  Received Narcan and may have responded minimally but was encephalopathic, required bag mask ventilation on arrival to the ED requiring intubation mechanical ventilation.  Subsequently more active with tachycardia, tachypnea but unclear whether there was anything purposeful.  He was started on sedating medication.  UDS positive for cocaine only.  Ethanol, salicylates, acetaminophen negative.  Lactic acid 7.2, BMP with acute renal failure S Cr 5.08, K+ 5.7.   Head CT 12/1 with no ICH, areas of acute/subacute left greater than right cerebellar, right occipital and right parietal infarction.     Past Medical History   Past Medical History:  Diagnosis Date  . Cocaine abuse (HCC)   . DVT of leg (deep venous thrombosis) (HCC) 01/16/2012   right  . Family history of early CAD    Father deceased at 57 yo with MI  . PE (pulmonary embolism) 2011; 07/2011  . Syncope and collapse 01/16/2012   "woke up to dog licking my face" (01/16/2012)  . Tobacco abuse         Patient Active Problem List   Diagnosis Date Noted  . Cannabis use disorder, moderate, dependence (HCC) 11/24/2015  . MDD (major depressive disorder), recurrent severe, without psychosis (HCC) 11/24/2015  . Cocaine use disorder, severe, dependence (HCC) 11/22/2015  . Chest pain 01/23/2012  . Syncope 01/16/2012  . DVT (deep venous thrombosis) (HCC) 01/16/2012  . Long term (current) use of anticoagulants 08/16/2011  . TOBACCO USER  12/27/2008  . NONDEPENDENT COCAINE ABUSE EPISODIC 10/01/2008  . PULMONARY EMBOLISM 08/26/2008         Past Surgical History:  Procedure Laterality Date  . NO PAST SURGERIES       Significant Hospital Events     Consults:  Neurology  Procedures:    Significant Diagnostic Tests:  Head CT 12/1 >>  EEG 12/1 >>   Micro Data:  Respiratory culture 12/1 >>  Blood 12/1 >>   Antimicrobials:  Zosyn x1 on 12/1 Unasyn 12/1 >>   Interim history/subjective:  Hypotension when he was initially sedated with propofol, currently on fentanyl 125, Versed 2   Objective   Blood pressure 109/74, pulse (!) 122, temperature (!) 100.4 F (38 C), resp. rate (!) 29, height 6\' 2"  (1.88 m), weight 114 kg, SpO2 95 %.    Vent Mode: PRVC FiO2 (%):  [100 %] 100 % Set Rate:  [18 bmp] 18 bmp Vt Set:  [630 mL] 630 mL PEEP:  [8 cmH20] 8 cmH20 Plateau Pressure:  [31 cmH20] 31 cmH20  No intake or output data in the 24 hours ending 03/15/2020 1629 Filed Weights   2020-03-15 1305  Weight: 114 kg    Examination: General: Ill-appearing man, ventilated, sedated, no distress HENT: ET tube in place, sclera injected, eyes deviated upwards, pupils equal and react to light Lungs: Clear on left, few rhonchi on inspiration on the right no wheezing Cardiovascular: Regular, distant, no murmur Abdomen: Soft, nondistended, no apparent tenderness, positive bowel sounds Extremities: No edema Neuro: Sedated, grimace  with stimulation but does not open eyes, does not track, does not follow commands.  He does have an intact gag, is breathing over the set rate on MV.  Toes downgoing GU: Foley catheter in place, yellow urine  Resolved Hospital Problem list     Assessment & Plan:   Acute toxic metabolic encephalopathy Need for sedation -Correct metabolic abnormalities as able -Currently on fentanyl, Versed, begin to wean. -Watch for any signs withdrawal  Acute respiratory failure principally due to altered  mental status and right lower lobe pneumonia, VDRF -Mechanical ventilation PRVC 8 cc/kg -ABG now, adjust minute ventilation accordingly -Follow chest x-ray -VAP prevention order set -Initiate SBT's when mental status will allow it  Acute renal failure, at high risk rhabdomyolysis -Check CPK and follow -Follow urine output, BMP -Repeat potassium now.  If rising then will need to consult nephrology now.  May need to consult them in the coming days depending on overall trend  Multifactorial anion gap metabolic acidosis.  Due to lactic acidosis, acute renal failure.  Consider also volatile alcohols -Follow lactate for clearance -Check volatile alcohols -Support with IV fluids  Acute occipital, parietal, cerebellar CVA based on CT head.  Question global hypoperfusion as downtime currently unclear.  Question hypertensive injury given cocaine use. -Will consult neurology for assistance. -EEG now -MRI brain when stable to undergo.  May need a CTA head neck depending on neurology eval and recommendation  Acute right lower lobe pneumonia, suspected aspiration pneumonia -Will attempt to obtain respiratory culture -Blood culture -Treat with Unasyn, started 12/1.  Tailor antibiotics to culture data -Follow chest x-ray and push pulmonary hygiene  Elevated troponin without significant EKG changes.  Question whether this may relate to rhabdomyolysis -ECG 12/1 reassuring, will follow -Follow troponin trend, correlate with CPK -Telemetry monitoring -Echocardiogram -Will involve cardiology if evidence for ACS or primary cardiac event  Shock liver, elevated transaminases -Follow LFT and coags -Avoid any hepatic toxins, dose meds appropriately  History of DVT/PE with lupus anticoagulant.  Does not appear that he was still on outpatient anticoagulation. -Heparin prophylaxis initiated. -Hesitant to start full anticoagulation at this time before discussing head CT results with  neurology  Polysubstance abuse, cocaine and tobacco, question other -Will need substance abuse counseling when acute events are resolved   Best practice (evaluated daily)   Diet: NPO Pain/Anxiety/Delirium protocol (if indicated): Fentanyl, Versed VAP protocol (if indicated): Ordered 12/1 DVT prophylaxis: Heparin GI prophylaxis:  Glucose control: CBGs, start SSI if greater than 180 Mobility: Bedrest last date of multidisciplinary goals of care discussion: Pending, no family available 12/1.  Attempted to call significant other Arnetha Courser.  No answer, left message Family and staff present: Pending Summary of discussion: Pending Follow up goals of care discussion due: Pending Code Status: Full Disposition: ICU  Labs   CBC: Recent Labs  Lab 03/12/2020 1303  WBC 14.7*  NEUTROABS 11.6*  HGB 17.2*  HCT 52.7*  MCV 90.9  PLT 153    Basic Metabolic Panel: Recent Labs  Lab Mar 12, 2020 1303  NA 147*  K 5.7*  CL 110  CO2 19*  GLUCOSE 109*  BUN 32*  CREATININE 5.08*  CALCIUM 8.0*   GFR: Estimated Creatinine Clearance: 25.4 mL/min (A) (by C-G formula based on SCr of 5.08 mg/dL (H)). Recent Labs  Lab 03/12/2020 1303  WBC 14.7*  LATICACIDVEN 7.2*    Liver Function Tests: Recent Labs  Lab 12-Mar-2020 1303  AST 2,100*  ALT PENDING  ALKPHOS 70  BILITOT 0.8  PROT 5.3*  ALBUMIN 3.1*   Recent Labs  Lab February 24, 2020 1303  LIPASE 26   No results for input(s): AMMONIA in the last 168 hours.  ABG No results found for: PHART, PCO2ART, PO2ART, HCO3, TCO2, ACIDBASEDEF, O2SAT   Coagulation Profile: No results for input(s): INR, PROTIME in the last 168 hours.  Cardiac Enzymes: No results for input(s): CKTOTAL, CKMB, CKMBINDEX, TROPONINI in the last 168 hours.  HbA1C: No results found for: HGBA1C  CBG: No results for input(s): GLUCAP in the last 168 hours.  Review of Systems:   Unable to obtain   Critical care time: 45 min     Levy Pupa, MD, PhD 02-24-2020,  5:07 PM [facility] [phone] or if no answer [phone]

## 2020-02-19 NOTE — ED Provider Notes (Signed)
[facility] EMERGENCY DEPARTMENT Provider Note   CSN: [id number] Arrival date & time: 03/09/2020  1252     History Chief Complaint  Patient presents with  . Drug Overdose    [name] is a 44 y.o. male.      No past medical history on file.  There are no problems to display for this patient.        No family history on file.  Social History   Tobacco Use  . Smoking status: Not on file  Substance Use Topics  . Alcohol use: Not on file  . Drug use: Not on file    Home Medications Prior to Admission medications   Not on File    Allergies    Patient has no allergy information on record.  Review of Systems   Review of Systems  Unable to perform ROS: Mental status change    Physical Exam Updated Vital Signs BP (!) 178/137   Pulse (!) 159   Resp (!) 31   SpO2 91%   Physical Exam Vitals and nursing note reviewed.  Constitutional:      Comments: Patient is obtunded and unresponsive, but is making spontaneous respirations.  He is tachypneic with labored breathing.  HENT:     Head: Normocephalic and atraumatic.  Eyes:     Comments: Pupils are 1 mm and constricted, nonreactive.  Wandering eye movements are noted.  Cardiovascular:     Rate and Rhythm: Regular rhythm. Tachycardia present.  Pulmonary:     Comments: Patient with rhonchorous breath sounds throughout.  He is being ventilated by bag valve mask.  He is taking spontaneous respirations, but respirations are labored and rapid. Musculoskeletal:     Cervical back: Normal range of motion.  Neurological:     Comments: Patient does seem to be exhibiting decorticate posturing.  He does not respond to noxious stimuli, loud voice, and does not have spontaneous movements.     ED Results / Procedures / Treatments   Labs (all labs ordered are listed, but only abnormal results are displayed) Labs Reviewed  COMPREHENSIVE METABOLIC PANEL  ETHANOL  LIPASE, BLOOD  SALICYLATE LEVEL   ACETAMINOPHEN LEVEL  CBC WITH DIFFERENTIAL/PLATELET  LACTIC ACID, PLASMA  LACTIC ACID, PLASMA  BLOOD GAS, ARTERIAL  URINALYSIS, ROUTINE W REFLEX MICROSCOPIC  RAPID URINE DRUG SCREEN, HOSP PERFORMED  TROPONIN I (HIGH SENSITIVITY)    EKG EKG Interpretation  Date/Time:  Wednesday February 19 2020 16:13:49 EST Ventricular Rate:  119 PR Interval:    QRS Duration: 83 QT Interval:  295 QTC Calculation: 415 R Axis:   65 Text Interpretation: Sinus tachycardia Probable left atrial enlargement Borderline repolarization abnormality Borderline ST elevation, anterior leads Confirmed by Geoffery Lyons ([id number]) on 02/20/2020 5:55:24 AM   Radiology No results found.  Procedures Procedures (including critical care time)  Medications Ordered in ED Medications  etomidate (AMIDATE) injection (20 mg Intravenous Given 03/01/2020 1256)  succinylcholine (ANECTINE) injection (200 mg Intravenous Given 02/23/2020 1256)  propofol (DIPRIVAN) 1000 MG/100ML infusion (has no administration in time range)    ED Course  I have reviewed the triage vital signs and the nursing notes.  Pertinent labs & imaging results that were available during my care of the patient were reviewed by me and considered in my medical decision making (see chart for details).    MDM Rules/Calculators/A&P  Patient is a 44 year old male brought by EMS for evaluation of unresponsiveness, suspected drug overdose.  He was found unresponsive in  a motel room surrounded by drug paraphernalia.  I am told he was apneic and unresponsive with unknown down time.  After receiving narcan, he arrived with what appears to be posturing with labored, rapid breathing, but is not responsive.  Upon arrival to the ED, I determined he was not protecting his airway and elected to proceed with intubation.  20mg  of etomidate along with 200 mg succinylcholine were given for RSI, then a 7.5 ET tube was easily placed using the glidescope.  Tube placement confirmed  by direct visualization, end tidal CO2, and auscultation over the chest and lungs.  He was started on a propofol drip, which did not provide adequate sedation when maxed out, then was changed to drips of fentanyl and versed.  Remainder of exam carried out showing no overt signs of trauma or significant physical findings.  Workup initiated including laboratory studies, CT of head, and UA/UDS.  Labs reveal multiorgan failure including markedly elevated liver enzymes, cardiac enzymes, renal function, and signs of infarct on head ct.  He also had grossly bloody stool which I suspect is related to intestinal ischemia as well.  I suspect shock/anoxia as the etiology.  IV fluids given and PCCM consulted.    Patient to be admitted to [facility].  Prognosis poor.    CRITICAL CARE Performed by: [facility] Total critical care time: 70 minutes Critical care time was exclusive of separately billable procedures and treating other patients. Critical care was necessary to treat or prevent imminent or life-threatening deterioration. Critical care was time spent personally by me on the following activities: development of treatment plan with patient and/or surrogate as well as nursing, discussions with consultants, evaluation of patient's response to treatment, examination of patient, obtaining history from patient or surrogate, ordering and performing treatments and interventions, ordering and review of laboratory studies, ordering and review of radiographic studies, pulse oximetry and re-evaluation of patient's condition.   Final Clinical Impression(s) / ED Diagnoses Final diagnoses:  None    Rx / DC Orders ED Discharge Orders    None       Geoffery Lyons, MD 02/20/20 [phone]

## 2020-02-19 NOTE — Progress Notes (Signed)
eLink Physician-Brief Progress Note Patient Name: [name] DOB: 1976-01-28 MRN: [id number]   Date of Service  03/14/2020  HPI/Events of Note  Nursing requesting a PRN sedative prior to MRI.  eICU Interventions  Plan: 1. Versed 2 mg IV X 1 prior to MI.     Intervention Category Major Interventions: Delirium, psychosis, severe agitation - evaluation and management  [name] 02/20/2020, 11:36 PM

## 2020-02-19 NOTE — Consult Note (Signed)
Neurology Consultation  Reason for Consult: AMS, stroke Referring Physician: Dr. Delton Coombes, PCCM  CC: AMS  History is obtained from: chart  HPI: [name] is a 44 y.o. male PMH of polysubstance abuse including cocaine, tobacco abuse, right lower extremity DVT/PE 2011 2013 with lupus anticoagulant positive apparently found on 02/23/2020 and a bed in a motel room. Unable to ascertain last known well time. Authorities were called to check up on him, had to break the door to get to him.  Was breathing shallow-received Narcan-question if he did become better responsive with Narcan but in the end, came with bag mask ventilation to the ER and had to be intubated for airway protection. UDS positive for cocaine. Lactate 7.2 BMP with acute renal failure serum creatinine 5.08 CT head with acute to subacute left.  The right cerebellar and right occipital right parietal infarction. Neurological consultation obtained for the above. PCCM team attempted to reach listed significant other with no success.   LKW: Unclear tpa given?: no, unclear last known well, establish stroke on CT Premorbid modified Rankin scale (mRS): Unable to ascertain due to patient's mentation  ROS: Unable to ascertain due to patient's mentation  Past medical history from prior connected chart DVT PE/lupus anticoagulant Polysubstance abuse  No family history on file. Unable to ascertain due to patient's mentation  Social History:   has no history on file for tobacco use, alcohol use, and drug use. Polysubstance abuse, tobacco abuse  Medications  Current Facility-Administered Medications:  .  acetaminophen (TYLENOL) tablet 650 mg, 650 mg, Oral, Q4H **OR** acetaminophen (TYLENOL) 160 MG/5ML solution 650 mg, 650 mg, Per Tube, Q4H **OR** acetaminophen (TYLENOL) suppository 650 mg, 650 mg, Rectal, Q4H, Gleason, Darcella Gasman, PA-C .  Ampicillin-Sulbactam (UNASYN) 3 g in sodium chloride 0.9 % 100 mL IVPB, 3 g, Intravenous, Q12H,  Joaquim Lai, RPH .  aspirin chewable tablet 81 mg, 81 mg, Oral, NOW, Gleason, Darcella Gasman, PA-C .  docusate sodium (COLACE) capsule 100 mg, 100 mg, Oral, BID PRN, Gleason, Darcella Gasman, PA-C .  fentaNYL in NS (77mcg/ml) infusion-PREMIX, 0-400 mcg/hr, Intravenous, Continuous, Gleason, Darcella Gasman, PA-C, Last Rate: 12.5 mL/hr at 03/07/2020 1450, 125 mcg/hr at 03/03/2020 1450 .  heparin injection 5,000 Units, 5,000 Units, Subcutaneous, Q8H, Gleason, Darcella Gasman, PA-C .  insulin aspart (novoLOG) injection 0-6 Units, 0-6 Units, Subcutaneous, Q4H, Gleason, Darcella Gasman, PA-C .  lactated ringers infusion, , Intravenous, Continuous, Gleason, Darcella Gasman, PA-C, Last Rate: 200 mL/hr at 03/03/2020 1732, New Bag at 03/20/2020 1732 .  midazolam (VERSED) 50 mg/50 mL (1 mg/mL) premix infusion, 2 mg/hr, Intravenous, Continuous, Gleason, Darcella Gasman, PA-C, Last Rate: 2 mL/hr at 03/18/2020 1418, 2 mg/hr at 03/12/2020 1418 .  pantoprazole (PROTONIX) EC tablet 40 mg, 40 mg, Oral, Daily, Gleason, Darcella Gasman, PA-C .  polyethylene glycol (MIRALAX / GLYCOLAX) packet 17 g, 17 g, Oral, Daily PRN, Gleason, Darcella Gasman, PA-C No current outpatient medications on file.   Exam: Current vital signs: BP 108/75   Pulse (!) 110   Temp 100.2 F (37.9 C)   Resp (!) 21   Ht 6\' 2"  (1.88 m)   Wt 114 kg   SpO2 97%   BMI 32.27 kg/m  Vital signs in last 24 hours: Temp:  [100 F (37.8 C)-100.6 F (38.1 C)] 100.2 F (37.9 C) (12/01 1715) Pulse Rate:  [40-167] 110 (12/01 1715) Resp:  [21-59] 21 (12/01 1715) BP: (65-178)/(14-140) 108/75 (12/01 1715) SpO2:  [77 %-100 %] 97 % (12/01 1715) FiO2 (%):  [  100 %] 100 % (12/01 1255) Weight:  [270 kg] 114 kg (12/01 1305) General: Sedated intubated HEENT: Normocephalic atraumatic CVs: Regular rhythm Respiratory: Vented Extremities warm well perfused with no edema Neurological exam Sedated intubated Sluggish pupillary reflexes bilaterally No gaze preference or deviation Corneals present bilaterally Breathing  over the vent No spontaneous movement To noxious stimulation to the traps, bites on the tube but no withdrawal noted. No withdrawal to noxious stimulation to the extremities  NIHSS 1a Level of Conscious.: 3 1b LOC Questions: 2 1c LOC Commands: 2 2 Best Gaze: 0 3 Visual: 3 4 Facial Palsy: 3 5a Motor Arm - left: 4 5b Motor Arm - Right: 4 6a Motor Leg - Left: 4 6b Motor Leg - Right: 4 7 Limb Ataxia: 0 8 Sensory: 2 9 Best Language: 3 10 Dysarthria: 2 11 Extinct. and Inatten.: 0 TOTAL: 33    Labs I have reviewed labs in epic and the results pertinent to this consultation are: CBC    Component Value Date/Time   WBC 14.7 (H) 02/25/2020 1303   RBC 5.80 2020/02/25 1303   HGB 16.3 Feb 25, 2020 1652   HCT 48.0 25-Feb-2020 1652   PLT 153 02-25-2020 1303   MCV 90.9 02-25-20 1303   MCH 29.7 February 25, 2020 1303   MCHC 32.6 2020/02/25 1303   RDW 15.2 02-25-2020 1303   LYMPHSABS 2.2 02/25/20 1303   MONOABS 0.9 02-25-20 1303   EOSABS 0.0 25-Feb-2020 1303   BASOSABS 0.0 25-Feb-2020 1303   CMP     Component Value Date/Time   NA 142 02/25/2020 1652   K 5.9 (H) 02-25-20 1652   CL 110 02-25-2020 1303   CO2 19 (L) 02/25/2020 1303   GLUCOSE 109 (H) 02/25/20 1303   BUN 32 (H) 2020/02/25 1303   CREATININE 5.08 (H) 02/25/2020 1303   CALCIUM 8.0 (L) February 25, 2020 1303   PROT 5.3 (L) 25-Feb-2020 1303   ALBUMIN 3.1 (L) 2020/02/25 1303   AST 2,100 (H) 02/25/2020 1303   ALT 2,636 (H) February 25, 2020 1303   ALKPHOS 70 Feb 25, 2020 1303   BILITOT 0.8 02-25-2020 1303   GFRNONAA 14 (L) February 25, 2020 1303   Imaging I have reviewed the images obtained: CT-scan of the brain IMPRESSION: No acute intracranial hemorrhage. Areas of acute/subacute infarction in the left greater than right cerebellum, right occipital lobe, and right parietal lobe.  Assessment:  44/M with PMH of polysubstance abuse, DVT PE with lupus anticoagulant positive, unclear if he is on anticoagulant at home, presenting after he  was found down in the motel. Unclear last known well.  Multiple metabolic derangements including acute renal dysfunction and acute hepatic dysfunction. CT head with bilateral posterior circulation strokes-right cerebellar, to a larger extent left cerebellar, right occipital and right parietal concerning for acute to subacute infarcts. Not a candidate for TPA due to unknown downtime and established toxicity. Differentials include acute ischemic stroke due to cardioembolic phenomenon versus vasospasm versus hypercoagulable state given his history of polysubstance abuse. Other differentials to consider would be seizures but clinical exam did not reveal any focal findings. Also in consideration will be anoxic/hypoxic brain injury.   Impression: Acute ischemic stroke-hypercoagulable state versus cardioembolic versus vasospasm Polysubstance abuse Evaluate for seizures Evaluate for hypoxic/anoxic brain injury   Recommendations -Stat MRI brain without contrast -Stat MRA head and neck to rule out basilar occlusion -Routine EEG -2D echo -A1c lipid panel -Frequent neurochecks -Telemetry  D/W Dr Delton Coombes and PCCM team in the ER  -- Milon Dikes, MD [facility] Pager: [phone] If 7pm  to 7am, please call on call as listed on AMION.  CRITICAL CARE ATTESTATION Performed by: Milon Dikes, MD Total critical care time: 40 minutes Critical care time was exclusive of separately billable procedures and treating other patients and/or supervising APPs/Residents/Students Critical care was necessary to treat or prevent imminent or life-threatening deterioration due to acute ischemic stroke  This patient is critically ill and at significant risk for neurological worsening and/or death and care requires constant monitoring. Critical care was time spent personally by me on the following activities: development of treatment plan with patient and/or surrogate as well as nursing, discussions with  consultants, evaluation of patient's response to treatment, examination of patient, obtaining history from patient or surrogate, ordering and performing treatments and interventions, ordering and review of laboratory studies, ordering and review of radiographic studies, pulse oximetry, re-evaluation of patient's condition, participation in multidisciplinary rounds and medical decision making of high complexity in the care of this patient.

## 2020-02-20 ENCOUNTER — Inpatient Hospital Stay (HOSPITAL_COMMUNITY): Payer: Self-pay

## 2020-02-20 DIAGNOSIS — I6340 Cerebral infarction due to embolism of unspecified cerebral artery: Secondary | ICD-10-CM | POA: Insufficient documentation

## 2020-02-20 DIAGNOSIS — I6389 Other cerebral infarction: Secondary | ICD-10-CM

## 2020-02-20 DIAGNOSIS — N179 Acute kidney failure, unspecified: Secondary | ICD-10-CM

## 2020-02-20 DIAGNOSIS — K7200 Acute and subacute hepatic failure without coma: Secondary | ICD-10-CM

## 2020-02-20 DIAGNOSIS — R509 Fever, unspecified: Secondary | ICD-10-CM

## 2020-02-20 DIAGNOSIS — R4182 Altered mental status, unspecified: Secondary | ICD-10-CM

## 2020-02-20 LAB — POCT I-STAT 7, (LYTES, BLD GAS, ICA,H+H)
Acid-base deficit: 5 mmol/L — ABNORMAL HIGH (ref 0.0–2.0)
Bicarbonate: 21.1 mmol/L (ref 20.0–28.0)
Calcium, Ion: 1.14 mmol/L — ABNORMAL LOW (ref 1.15–1.40)
HCT: 44 % (ref 39.0–52.0)
Hemoglobin: 15 g/dL (ref 13.0–17.0)
O2 Saturation: 97 %
Patient temperature: 36.9
Potassium: 6.4 mmol/L (ref 3.5–5.1)
Sodium: 139 mmol/L (ref 135–145)
TCO2: 22 mmol/L (ref 22–32)
pCO2 arterial: 41.6 mmHg (ref 32.0–48.0)
pH, Arterial: 7.313 — ABNORMAL LOW (ref 7.350–7.450)
pO2, Arterial: 100 mmHg (ref 83.0–108.0)

## 2020-02-20 LAB — GLUCOSE, CAPILLARY
Glucose-Capillary: 104 mg/dL — ABNORMAL HIGH (ref 70–99)
Glucose-Capillary: 102 mg/dL — ABNORMAL HIGH (ref 70–99)
Glucose-Capillary: 110 mg/dL — ABNORMAL HIGH (ref 70–99)
Glucose-Capillary: 97 mg/dL (ref 70–99)
Glucose-Capillary: 101 mg/dL — ABNORMAL HIGH (ref 70–99)
Glucose-Capillary: 118 mg/dL — ABNORMAL HIGH (ref 70–99)

## 2020-02-20 LAB — HEPATIC FUNCTION PANEL
ALT: 2253 U/L — ABNORMAL HIGH (ref 0–44)
AST: 1447 U/L — ABNORMAL HIGH (ref 15–41)
Albumin: 2.5 g/dL — ABNORMAL LOW (ref 3.5–5.0)
Alkaline Phosphatase: 47 U/L (ref 38–126)
Bilirubin, Direct: 0.3 mg/dL — ABNORMAL HIGH (ref 0.0–0.2)
Indirect Bilirubin: 0.5 mg/dL (ref 0.3–0.9)
Total Bilirubin: 0.8 mg/dL (ref 0.3–1.2)
Total Protein: 4.6 g/dL — ABNORMAL LOW (ref 6.5–8.1)

## 2020-02-20 LAB — RENAL FUNCTION PANEL
Albumin: 2.6 g/dL — ABNORMAL LOW (ref 3.5–5.0)
Anion gap: 14 (ref 5–15)
BUN: 60 mg/dL — ABNORMAL HIGH (ref 6–20)
CO2: 20 mmol/L — ABNORMAL LOW (ref 22–32)
Calcium: 8.3 mg/dL — ABNORMAL LOW (ref 8.9–10.3)
Chloride: 106 mmol/L (ref 98–111)
Creatinine, Ser: 8.5 mg/dL — ABNORMAL HIGH (ref 0.61–1.24)
GFR, Estimated: 7 mL/min — ABNORMAL LOW (ref >60)
Glucose, Bld: 111 mg/dL — ABNORMAL HIGH (ref 70–99)
Phosphorus: 4.8 mg/dL — ABNORMAL HIGH (ref 2.5–4.6)
Potassium: 6.5 mmol/L (ref 3.5–5.1)
Sodium: 140 mmol/L (ref 135–145)

## 2020-02-20 LAB — VOLATILES,BLD-ACETONE,ETHANOL,ISOPROP,METHANOL
Acetone, blood: <0.01 g/dL (ref 0.000–0.010)
Ethanol, blood: <0.01 g/dL (ref 0.000–0.010)
Isopropanol, blood: <0.01 g/dL (ref 0.000–0.010)
Methanol, blood: <0.01 g/dL (ref 0.000–0.010)

## 2020-02-20 LAB — CBC
HCT: 43.3 % (ref 39.0–52.0)
Hemoglobin: 14.8 g/dL (ref 13.0–17.0)
MCH: 29.7 pg (ref 26.0–34.0)
MCHC: 34.2 g/dL (ref 30.0–36.0)
MCV: 86.8 fL (ref 80.0–100.0)
Platelets: 99 10*3/uL — ABNORMAL LOW (ref 150–400)
RBC: 4.99 MIL/uL (ref 4.22–5.81)
RDW: 15.2 % (ref 11.5–15.5)
WBC: 11.1 10*3/uL — ABNORMAL HIGH (ref 4.0–10.5)
nRBC: 0 % (ref 0.0–0.2)

## 2020-02-20 LAB — BASIC METABOLIC PANEL
Anion gap: 16 — ABNORMAL HIGH (ref 5–15)
BUN: 53 mg/dL — ABNORMAL HIGH (ref 6–20)
CO2: 20 mmol/L — ABNORMAL LOW (ref 22–32)
Calcium: 8.1 mg/dL — ABNORMAL LOW (ref 8.9–10.3)
Chloride: 106 mmol/L (ref 98–111)
Creatinine, Ser: 7.35 mg/dL — ABNORMAL HIGH (ref 0.61–1.24)
GFR, Estimated: 9 mL/min — ABNORMAL LOW (ref >60)
Glucose, Bld: 104 mg/dL — ABNORMAL HIGH (ref 70–99)
Potassium: 6.4 mmol/L (ref 3.5–5.1)
Sodium: 142 mmol/L (ref 135–145)

## 2020-02-20 LAB — LIPID PANEL
Cholesterol: 98 mg/dL (ref 0–200)
HDL: 27 mg/dL — ABNORMAL LOW (ref >40)
LDL Cholesterol: 33 mg/dL (ref 0–99)
Total CHOL/HDL Ratio: 3.6 RATIO
Triglycerides: 189 mg/dL — ABNORMAL HIGH (ref <150)
VLDL: 38 mg/dL (ref 0–40)

## 2020-02-20 LAB — MRSA PCR SCREENING: MRSA by PCR: NEGATIVE

## 2020-02-20 LAB — ECHOCARDIOGRAM COMPLETE
Area-P 1/2: 5.27 cm2
Height: 73 in
S' Lateral: 3.1 cm
Weight: 3834.24 oz

## 2020-02-20 LAB — PROTIME-INR
INR: 1.5 — ABNORMAL HIGH (ref 0.8–1.2)
Prothrombin Time: 17.8 seconds — ABNORMAL HIGH (ref 11.4–15.2)

## 2020-02-20 LAB — MAGNESIUM
Magnesium: 1.9 mg/dL (ref 1.7–2.4)
Magnesium: 2 mg/dL (ref 1.7–2.4)

## 2020-02-20 LAB — LACTIC ACID, PLASMA: Lactic Acid, Venous: 2.1 mmol/L (ref 0.5–1.9)

## 2020-02-20 LAB — CK: Total CK: 2032 U/L — ABNORMAL HIGH (ref 49–397)

## 2020-02-20 LAB — PHOSPHORUS
Phosphorus: 4.7 mg/dL — ABNORMAL HIGH (ref 2.5–4.6)
Phosphorus: 5.4 mg/dL — ABNORMAL HIGH (ref 2.5–4.6)

## 2020-02-20 LAB — APTT: aPTT: 33 seconds (ref 24–36)

## 2020-02-20 MED ORDER — POLYETHYLENE GLYCOL 3350 17 G PO PACK: 17.0000 g | PACK | Freq: Every day | ORAL | Status: DC | PRN

## 2020-02-20 MED ORDER — SODIUM CHLORIDE 0.9 % IV SOLN: INTRAVENOUS | Status: DC | PRN

## 2020-02-20 MED ORDER — VANCOMYCIN VARIABLE DOSE PER UNSTABLE RENAL FUNCTION (PHARMACIST DOSING): Status: DC

## 2020-02-20 MED ORDER — VANCOMYCIN HCL 2000 MG/400ML IV SOLN
2000.0000 mg | Freq: Once | INTRAVENOUS | Status: AC
  Administered 2020-02-20: 2000 mg via INTRAVENOUS
  Filled 2020-02-20: qty 400

## 2020-02-20 MED ORDER — SODIUM ZIRCONIUM CYCLOSILICATE 5 G PO PACK
10.0000 g | PACK | Freq: Two times a day (BID) | ORAL | Status: DC
  Administered 2020-02-21: 10 g
  Filled 2020-02-20 (×2): qty 2

## 2020-02-20 MED ORDER — SODIUM ZIRCONIUM CYCLOSILICATE 5 G PO PACK: 10.0000 g | PACK | Freq: Two times a day (BID) | ORAL | Status: DC

## 2020-02-20 MED ORDER — ACETAMINOPHEN 160 MG/5ML PO SOLN: 650.0000 mg | Freq: Four times a day (QID) | ORAL | Status: DC | PRN

## 2020-02-20 MED ORDER — DOCUSATE SODIUM 50 MG/5ML PO LIQD: 100.0000 mg | Freq: Two times a day (BID) | ORAL | Status: DC | PRN

## 2020-02-20 MED ORDER — SODIUM CHLORIDE 0.9 % IV SOLN
2.0000 g | INTRAVENOUS | Status: DC
  Administered 2020-02-20 – 2020-02-21 (×2): 2 g via INTRAVENOUS
  Filled 2020-02-20 (×2): qty 2

## 2020-02-20 MED ORDER — PROSOURCE TF PO LIQD
90.0000 mL | Freq: Three times a day (TID) | ORAL | Status: DC
  Administered 2020-02-20 – 2020-02-22 (×6): 90 mL
  Filled 2020-02-20 (×6): qty 90

## 2020-02-20 MED ORDER — PANTOPRAZOLE SODIUM 40 MG PO PACK
40.0000 mg | PACK | Freq: Every day | ORAL | Status: DC
  Administered 2020-02-20 – 2020-02-22 (×3): 40 mg
  Filled 2020-02-20 (×3): qty 20

## 2020-02-20 MED ORDER — DEXMEDETOMIDINE HCL IN NACL 400 MCG/100ML IV SOLN
0.4000 ug/kg/h | INTRAVENOUS | Status: DC
  Administered 2020-02-20 – 2020-02-21 (×2): 0.4 ug/kg/h via INTRAVENOUS
  Administered 2020-02-21: 0.9 ug/kg/h via INTRAVENOUS
  Administered 2020-02-22: 0.7 ug/kg/h via INTRAVENOUS
  Administered 2020-02-22: 0.6 ug/kg/h via INTRAVENOUS
  Administered 2020-02-22: 0.4 ug/kg/h via INTRAVENOUS
  Administered 2020-02-22: 0.6 ug/kg/h via INTRAVENOUS
  Administered 2020-02-23 (×3): 0.7 ug/kg/h via INTRAVENOUS
  Administered 2020-02-23 (×2): 0.8 ug/kg/h via INTRAVENOUS
  Administered 2020-02-24: 0.7 ug/kg/h via INTRAVENOUS
  Administered 2020-02-24 (×2): 0.8 ug/kg/h via INTRAVENOUS
  Filled 2020-02-20 (×4): qty 100
  Filled 2020-02-20: qty 200
  Filled 2020-02-20 (×13): qty 100

## 2020-02-20 MED ORDER — SODIUM ZIRCONIUM CYCLOSILICATE 5 G PO PACK
10.0000 g | PACK | Freq: Once | ORAL | Status: AC
  Administered 2020-02-20: 10 g
  Filled 2020-02-20: qty 2

## 2020-02-20 MED ORDER — ASPIRIN 81 MG PO CHEW
81.0000 mg | CHEWABLE_TABLET | Freq: Every day | ORAL | Status: DC
  Administered 2020-02-20 – 2020-02-22 (×3): 81 mg via ORAL
  Filled 2020-02-20 (×3): qty 1

## 2020-02-20 MED ORDER — VITAL 1.5 CAL PO LIQD
1000.0000 mL | ORAL | Status: DC
  Administered 2020-02-20 – 2020-02-21 (×2): 1000 mL
  Filled 2020-02-20 (×2): qty 1000

## 2020-02-20 MED ORDER — ACETAMINOPHEN 650 MG RE SUPP: 650.0000 mg | Freq: Four times a day (QID) | RECTAL | Status: DC | PRN

## 2020-02-20 MED ORDER — ACETAMINOPHEN 325 MG PO TABS: 650.0000 mg | ORAL_TABLET | Freq: Four times a day (QID) | ORAL | Status: DC | PRN

## 2020-02-20 MED ORDER — SODIUM ZIRCONIUM CYCLOSILICATE 5 G PO PACK
10.0000 g | PACK | Freq: Four times a day (QID) | ORAL | Status: AC
  Administered 2020-02-20: 10 g

## 2020-02-20 MED ORDER — SODIUM ZIRCONIUM CYCLOSILICATE 5 G PO PACK
10.0000 g | PACK | Freq: Four times a day (QID) | ORAL | Status: DC
  Filled 2020-02-20: qty 2

## 2020-02-20 NOTE — Plan of Care (Signed)
  Problem: Education: Goal: Knowledge of disease or condition will improve 02/20/2020 1129 by Toy Baker, RN Outcome: Not Progressing 02/20/2020 1123 by Toy Baker, RN Outcome: Not Progressing Goal: Knowledge of secondary prevention will improve 02/20/2020 1129 by Toy Baker, RN Outcome: Not Progressing 02/20/2020 1123 by Toy Baker, RN Outcome: Not Progressing Goal: Knowledge of patient specific risk factors addressed and post discharge goals established will improve 02/20/2020 1129 by Toy Baker, RN Outcome: Not Progressing 02/20/2020 1123 by Toy Baker, RN Outcome: Not Progressing   Problem: Education: Goal: Knowledge of disease or condition will improve Outcome: Not Progressing Goal: Knowledge of secondary prevention will improve Outcome: Not Progressing Goal: Knowledge of patient specific risk factors addressed and post discharge goals established will improve Outcome: Not Progressing Goal: Individualized Educational Video(s) Outcome: Not Progressing   Problem: Coping: Goal: Will verbalize positive feelings about self Outcome: Not Progressing   Problem: Health Behavior/Discharge Planning: Goal: Ability to manage health-related needs will improve Outcome: Not Progressing   Problem: Self-Care: Goal: Ability to participate in self-care as condition permits will improve Outcome: Not Progressing Goal: Verbalization of feelings and concerns over difficulty with self-care will improve Outcome: Not Progressing Goal: Ability to communicate needs accurately will improve Outcome: Not Progressing   Problem: Nutrition: Goal: Risk of aspiration will decrease Outcome: Not Progressing Goal: Dietary intake will improve Outcome: Not Progressing   Problem: Intracerebral Hemorrhage Tissue Perfusion: Goal: Complications of Intracerebral Hemorrhage will be minimized Outcome: Not Progressing   Problem: Ischemic Stroke/TIA Tissue Perfusion: Goal: Complications of  ischemic stroke/TIA will be minimized Outcome: Not Progressing   Problem: Spontaneous Subarachnoid Hemorrhage Tissue Perfusion: Goal: Complications of Spontaneous Subarachnoid Hemorrhage will be minimized Outcome: Not Progressing

## 2020-02-20 NOTE — Progress Notes (Addendum)
eLink Physician-Brief Progress Note Patient Name: [name] DOB: [birth year] MRN: [id number]   Date of Service  02/20/2020  HPI/Events of Note  Order written for normothermic TTM at 16:36 yesterday. However, ordered was never executed. Temp now = 98.6 F. Notes from Neurology and PCCM do not mention normothermic TTM. In addition, MRI reveals petechial hemorrhage in the right parietal lobe, right occipital lobe and both cerebellar hemispheres.  eICU Interventions  Will D/C normothermic TTM order.      Intervention Category Major Interventions: Other:  Asha Grumbine Dennard Nip 02/20/2020, 6:18 AM

## 2020-02-20 NOTE — Progress Notes (Addendum)
NAME:  [name], MRN:  [id number], DOB:  [birth year], LOS: 1 ADMISSION DATE:  02-20-20, CONSULTATION DATE:  2020-02-20 REFERRING MD:  [name], CHIEF COMPLAINT:  AMS   Brief History   Mr.Dakin is a 44 yo M w/ PMH of polysubstance use, lupus w/ RLE DVT/PE (2011,2013) presenting to [facility] with unresponsiveness and encephalopathy due acute cerebral infarct and renal failure  Past Medical History  Cocaine Use Lupus Major depression  Significant Hospital Events   2020-02-20 Admission  Consults:  Neurology  Procedures:  Feb 20, 2020 Intubated  Significant Diagnostic Tests:  February 20, 2020: CT head - No acute hemorrahge 02/20/20: MRA head/neck - bilateral acute infarcts in watershed pattern, petechial hemorrhage in right parietal lobe, occipital lobe and cerebellar hemispheres  Micro Data:  02-20-20: COVID/Flu neg  Antimicrobials:  02/20/20: Unasyn ->  Interim history/subjective:  Overnight noted to be hyperkalemic, started on Lokelma. TTM discontinued. MRI performed showing hemorrhage and acute infarcts described above.  Objective   Blood pressure 104/72, pulse 96, temperature 99.1 F (37.3 C), resp. rate 16, height 6\' 1"  (1.854 m), weight 108.7 kg, SpO2 96 %.    Vent Mode: PRVC FiO2 (%):  [40 %-100 %] 40 % Set Rate:  [18 bmp] 18 bmp Vt Set:  [630 mL] 630 mL PEEP:  [8 cmH20] 8 cmH20 Plateau Pressure:  [20 cmH20-31 cmH20] 23 cmH20   Intake/Output Summary (Last 24 hours) at 02/20/2020 0744 Last data filed at 02/20/2020 0700 Gross per 24 hour  Intake 3262.57 ml  Output 325 ml  Net 2937.57 ml   Filed Weights   2020/02/20 1305 02/20/2020 2220  Weight: 114 kg 108.7 kg    Examination: Gen: Well-developed, ill-appearing, sedated HEENT: NCAT head, hearing intact, intubated Neck: supple, ROM intact, no JVD CV: Tachycardic, regular rhythm, S1, S2 normal, No rubs, no murmurs, no gallops Pulm: CTAB, No rales, no wheeze Abd: Soft, BS+ Extm: ROM intact, Peripheral pulses intact, No peripheral  edema Skin: Dry, Warm, normal turgor, no rashes, lesions, wounds.  Neuro: sedated  Resolved Hospital Problem list   N/A  Assessment & Plan:  #Acute metabolic encephalopathy #Watershed Infarcts & petechial hemorrhage MR overnight with new findings of watershed infarct and petechial hemorrhage. Possibly due to cocaine induced vasospasm vs known hypercoagulable state from lupus. - Appreciate neuro recs - F/u EEG - Seizure precautions - Hold anti-coagulation in setting of intracranial bleed - Wean off sedation, assess neuro status  #Acute respiratory failure 2/2 RML pneumonia, encephalopathy Intubated on arrival. Not improved with narcan. Wbc 10.5. Temp peak of 99.14F. Current vent settings FiO2 90, PEEP 8, TV 640 RR 18. o2 sat 98. Abg showing overnight showing pH of 7.3, pCO2 of 41.6, pO2 100, Bicarb 21 - C/w mechanical ventilation - Decrease sedation as above - PRVC 8cc/kg - VAp prevention - Spontaenous breathing trials if encephalopathy improving  #Acute renal failure #Hyperkalemia BUN 53, Creatinine 7.35, K 6.4. Received dose of lokelma overnight. EKG without acute changes on admission. Likely due to rhabdo with CK >2000 and questionable seizure activity on admission.  - Nephro consult - F/u EKG - C/w lokelma  #Anion gap metabolic acidosis Gap of 16, Co2 of 20, pH 7.3. Lactate 4.1.  - Currently on LR - Trend bmp - Ventilation setting changes as above  #NSTEMI Admit troponin with elevation from 11k->17k. Likely due to vasospasm in setting of cocaine use. UDS + for cocaine. EKG without ST changes. Echocardiogram pending to assess for wall motion abnormality. Would not be candidate for cath due to renal  failure. Current bp stable at 110/80 - Trend troponin - F/u echo - Avoid beta-blockade - Counseling for cocaine cessation when encephalopathy resolves  #Elevated transaminsases AST, ALT >2000. Likely due to shock liver.  - Avoid hepatotoxic meds when able - Trend  LFTs  #DVT/PE w/ lupus Not on anticoagulation - Hold anticoagulation in setting of intracranial hemorrhage  Best practice (evaluated daily)   Diet: N/A Pain/Anxiety/Delirium protocol (if indicated): versed VAP protocol (if indicated):  DVT prophylaxis: SCDs GI prophylaxis: PPI Glucose control: ssi Mobility: N/A last date of multidisciplinary goals of care discussion: N/A Family and staff present N/A Summary of discussion N/A Follow up goals of care discussion due N/A Code Status: full code  Labs   CBC: Recent Labs  Lab 2020-03-05 1303 03-05-20 1635 03/05/2020 1652 02/20/20 0422  WBC 14.7* 10.5  --   --   NEUTROABS 11.6*  --   --   --   HGB 17.2* 15.6 16.3 15.0  HCT 52.7* 49.0 48.0 44.0  MCV 90.9 91.8  --   --   PLT 153 120*  --   --     Basic Metabolic Panel: Recent Labs  Lab 03-05-20 1303 03/05/2020 1624 Mar 05, 2020 1635 Mar 05, 2020 1652 02/20/20 0422  NA 147*  --   --  142 139  K 5.7*  --   --  5.9* 6.4*  CL 110  --   --   --   --   CO2 19*  --   --   --   --   GLUCOSE 109*  --   --   --   --   BUN 32*  --   --   --   --   CREATININE 5.08* 5.58*  --   --   --   CALCIUM 8.0*  --   --   --   --   MG  --   --  1.7  --   --    GFR: Estimated Creatinine Clearance: 22.3 mL/min (A) (by C-G formula based on SCr of 5.58 mg/dL (H)). Recent Labs  Lab 2020/03/05 1303 2020-03-05 1503 03-05-2020 1635 03-05-20 2035  WBC 14.7*  --  10.5  --   LATICACIDVEN 7.2* 4.7*  --  4.1*    Liver Function Tests: Recent Labs  Lab 2020-03-05 1303  AST 2,100*  ALT 2,636*  ALKPHOS 70  BILITOT 0.8  PROT 5.3*  ALBUMIN 3.1*   Recent Labs  Lab Mar 05, 2020 1303  LIPASE 26   No results for input(s): AMMONIA in the last 168 hours.  ABG    Component Value Date/Time   PHART 7.313 (L) 02/20/2020 0422   PCO2ART 41.6 02/20/2020 0422   PO2ART 100 02/20/2020 0422   HCO3 21.1 02/20/2020 0422   TCO2 22 02/20/2020 0422   ACIDBASEDEF 5.0 (H) 02/20/2020 0422   O2SAT 97.0 02/20/2020 0422      Coagulation Profile: Recent Labs  Lab 03-05-20 1624  INR 1.3*    Cardiac Enzymes: Recent Labs  Lab 2020/03/05 1624  CKTOTAL 2,152*    HbA1C: Hgb A1c MFr Bld  Date/Time Value Ref Range Status  March 05, 2020 06:17 PM 5.6 4.8 - 5.6 % Final    Comment:    REPEATED TO VERIFY (NOTE) Pre diabetes:          5.7%-6.4%  Diabetes:              >6.4%  Glycemic control for   <7.0% adults with diabetes     CBG: Recent Labs  Lab 03/17/2020 1829 02/24/2020 2005 03/11/2020 2310 02/20/20 0306  GLUCAP 123* 112* 116* 104*    Review of Systems:   Unable to obtain  Past Medical History  He,  has no past medical history on file.   Surgical History   None  Social History   Prior hx of cocaine, THC use. Unstable housing (found in motel)  Family History   His family history is not on file.   Allergies Not on File   Home Medications  Prior to Admission medications   Not on File

## 2020-02-20 NOTE — Plan of Care (Signed)
Started on tube feeding

## 2020-02-20 NOTE — TOC CAGE-AID Note (Signed)
Transition of Care [facility]) - CAGE-AID Screening   Patient Details  Name: [name] MRN: [id number] Date of Birth: [birth year]  [facility]) CM/SW Contact:    Jimmy Picket, LCSWA Phone Number: 02/20/2020, 10:51 AM   Clinical Narrative:  Pt is unable to participate in assessment due to being on vent   CAGE-AID Screening: Substance Abuse Screening unable to be completed due to: : Patient unable to participate               Isabella Stalling Clinical Social Worker [phone]

## 2020-02-20 NOTE — Progress Notes (Signed)
  Echocardiogram 2D Echocardiogram has been performed.  [name] 02/20/2020, 8:35 AM

## 2020-02-20 NOTE — Progress Notes (Signed)
RT note. Pt. Transported to MRI and back without any complications.

## 2020-02-20 NOTE — Progress Notes (Signed)
Was able to get in touch with Ms.[name], Mr.[name]'s wife. She mentions that she has not seen or contacted him for 4 years and is unable to provide any further information on patient's admission. She states she knows he is suppose to be on anti-coagulation but he is non-adherent to prescribed medications. When asked regarding goals-of-care discussion, she mentions that she 'does not want anything to do with him' and defers any such discussion to other family members as she is currently in the process of finalizing divorce process with Mr.[name]. She mentions that she knows Mr.[name] who is his brother who works for [employer] and Mrs.[name] who is his sister who works for [employer]. She has no phone number or other contact information for his siblings.

## 2020-02-20 NOTE — Progress Notes (Signed)
Upper lip  Swelling noted.  CCM resident, Sharia Reeve, called to bedside to assess.ETT repositioned.

## 2020-02-20 NOTE — Plan of Care (Signed)
  Problem: Education: Goal: Knowledge of disease or condition will improve Outcome: Not Progressing Goal: Knowledge of secondary prevention will improve Outcome: Not Progressing Goal: Knowledge of patient specific risk factors addressed and post discharge goals established will improve Outcome: Not Progressing   

## 2020-02-20 NOTE — Progress Notes (Signed)
CRITICAL VALUE ALERT  Critical Value:  Potassium 6.5  Date & Time Notied:  02/20/2020 @ 1448   Provider Notified: Dr. Francine Graven and Dr. Nedra Hai  Orders Received/Actions taken: No new orders at this time.

## 2020-02-20 NOTE — Progress Notes (Signed)
Fentanyl 200cc wasted in stericycle. Versed 35cc wasted in sharps. Witnessed by Toniann Fail.

## 2020-02-20 NOTE — Progress Notes (Signed)
STROKE TEAM PROGRESS NOTE   INTERVAL HISTORY His RN is at the bedside.  Pt intubated on vent, not responding. Intact brainstem reflexes but not able to open eyes or following any commands. Afebrile this am, blood culture pending. MRI showed diffuse bilateral watershed infarct scattered semi-confluent but with b/l large cerebellar and left frontal small infarcts. IV ventricle mildly compressed. Per RN, family has not been able to reach. Currently on unasyn.   Vitals:   02/20/20 0500 02/20/20 0600 02/20/20 0700 02/20/20 0727  BP: 111/80 105/74 110/74 104/72  Pulse: (!) 102 (!) 102 96   Resp: Temp: 98.6 F (37 C) 99 F (37.2 C) 99.1 F (37.3 C)   TempSrc:      SpO2: 93% 93% 95% 96%  Weight:      Height:       CBC:  Recent Labs  Lab 03/12/2020 1303 02/28/2020 1303 02/21/2020 1635 03/01/2020 1652 02/20/20 0422 02/20/20 0631  WBC 14.7*   < > 10.5  --   --  11.1*  NEUTROABS 11.6*  --   --   --   --   --   HGB 17.2*   < > 15.6   < > 15.0 14.8  HCT 52.7*   < > 49.0   < > 44.0 43.3  MCV 90.9   < > 91.8  --   --  86.8  PLT 153   < > 120*  --   --  99*   < > = values in this interval not displayed.   Basic Metabolic Panel:  Recent Labs  Lab 03/19/2020 1303 03/07/2020 1303 03/17/2020 1624 02/20/2020 1635 03/14/2020 1652 02/20/20 0422 02/20/20 0631  NA 147*  --   --   --    < > 139 142  K 5.7*  --   --   --    < > 6.4* 6.4*  CL 110  --   --   --   --   --  106  CO2 19*  --   --   --   --   --  20*  GLUCOSE 109*  --   --   --   --   --  104*  BUN 32*  --   --   --   --   --  53*  CREATININE 5.08*   < > 5.58*  --   --   --  7.35*  CALCIUM 8.0*  --   --   --   --   --  8.1*  MG  --   --   --  1.7  --   --  1.9  PHOS  --   --   --   --   --   --  4.7*   < > = values in this interval not displayed.   Lipid Panel:  Recent Labs  Lab 02/20/20 0631  CHOL 98  TRIG 189*  HDL 27*  CHOLHDL 3.6  VLDL 38  LDLCALC 33   HgbA1c:  Recent Labs  Lab 03/11/2020 1817  HGBA1C 5.6   Urine  Drug Screen:  Recent Labs  Lab 02/25/2020 1350  LABOPIA NONE DETECTED  COCAINSCRNUR POSITIVE*  LABBENZ NONE DETECTED  AMPHETMU NONE DETECTED  THCU NONE DETECTED  LABBARB NONE DETECTED    Alcohol Level  Recent Labs  Lab 03/01/2020 1303  ETH <10    IMAGING past 24 hours DG Cervical Spine 1 View  Result Date:  2020-03-01 CLINICAL DATA:  Intubated, preprocedure evaluation for MRI EXAM: DG CERVICAL SPINE - 1 VIEW COMPARISON:  None. FINDINGS: Two lateral views of the cervical spine are obtained. The cervical spine is in anatomic alignment to the C6/C7 disc space. Endotracheal tube and enteric catheter are identified. Diffuse dental amalgam. No unexpected radiopaque foreign bodies. IMPRESSION: 1. Support devices and dental amalgam as above. No unexpected radiopaque foreign bodies. Electronically Signed   By: Sharlet Salina M.D.   On: 03/01/20 20:10   DG Pelvis 1-2 Views  Result Date: 03/01/2020 CLINICAL DATA:  Abnormal head CT, preprocedure evaluation for MRI EXAM: PELVIS - 1-2 VIEW COMPARISON:  None. FINDINGS: Two frontal views of the pelvis demonstrate Foley catheter. Rounded calcification left gluteal region may reflect injection granulomata. No unexpected radiopaque foreign bodies. No acute bony abnormalities. Soft tissues are unremarkable. IMPRESSION: 1. Foley catheter as above. No unexpected radiopaque foreign bodies. Electronically Signed   By: Sharlet Salina M.D.   On: 01-Mar-2020 20:11   DG Abd 1 View  Result Date: 2020-03-01 CLINICAL DATA:  Abnormal head CT, preprocedure evaluation for MRI EXAM: ABDOMEN - 1 VIEW COMPARISON:  None. FINDINGS: Frontal view of the lower chest and upper abdomen excludes the right lateral chest wall by collimation. Enteric catheter tip and side port project over the gastric fundus. Stable right perihilar airspace disease. Bowel gas pattern is unremarkable. No unexpected radiopaque foreign bodies. IMPRESSION: 1. Enteric catheter as above. Otherwise no unexpected  radiopaque foreign bodies. 2. Stable asymmetric right-sided airspace disease which may reflect aspiration or pneumonia. Electronically Signed   By: Sharlet Salina M.D.   On: 01-Mar-2020 20:09   CT Head Wo Contrast  Result Date: Mar 01, 2020 CLINICAL DATA:  Found unresponsive EXAM: CT HEAD WITHOUT CONTRAST TECHNIQUE: Contiguous axial images were obtained from the base of the skull through the vertex without intravenous contrast. COMPARISON:  2011 FINDINGS: Brain: There is no acute intracranial hemorrhage. There is hypoattenuation in the peripheral left greater than right cerebellum. There is likely small area of right occipital involvement with loss of gray-white differentiation. Additional area of loss of gray-white differentiation is suspected right parietal lobe. No hydrocephalus or herniation.  There is no extra-axial collection. Vascular: No hyperdense vessel or unexpected calcification. Skull: Calvarium is unremarkable. Sinuses/Orbits: Patchy mucosal thickening.  Orbits are unremarkable. Other: None. IMPRESSION: No acute intracranial hemorrhage. Areas of acute/subacute infarction in the left greater than right cerebellum, right occipital lobe, and right parietal lobe. These results were called by telephone at the time of interpretation on 2020-03-01 at 3:20 pm to provider Geoffery Lyons , who verbally acknowledged these results. Electronically Signed   By: Guadlupe Spanish M.D.   On: Mar 01, 2020 15:22   MR ANGIO HEAD WO CONTRAST  Result Date: 02/20/2020 CLINICAL DATA:  Encephalopathy EXAM: MRI HEAD WITHOUT CONTRAST MRA HEAD WITHOUT CONTRAST MRA NECK WITHOUT CONTRAST TECHNIQUE: Multiplanar, multiecho pulse sequences of the brain and surrounding structures were obtained without intravenous contrast. Angiographic images of the Circle of Willis were obtained using MRA technique without intravenous contrast. Angiographic images of the neck were obtained using MRA technique without intravenous contrast. Carotid  stenosis measurements (when applicable) are obtained utilizing NASCET criteria, using the distal internal carotid diameter as the denominator. COMPARISON:  None. FINDINGS: MRI HEAD FINDINGS Brain: Numerous bilateral acute infarcts that are in a predominantly deep watershed pattern. Additionally, there are large bilateral cerebellar infarcts. Multifocal cytotoxic edema. No midline shift or other mass effect. There is petechial hemorrhage in the right parietal lobe, right occipital  lobe and both cerebellar hemispheres. Normal volume of CSF spaces. Vascular: Major flow voids are preserved. Skull and upper cervical spine: Small subgaleal hematoma at the scalp vertex. Sinuses/Orbits:No paranasal sinus fluid levels or advanced mucosal thickening. No mastoid or middle ear effusion. Normal orbits. MRA HEAD FINDINGS POSTERIOR CIRCULATION: --Vertebral arteries: Normal --Inferior cerebellar arteries: Normal. --Basilar artery: Normal. --Superior cerebellar arteries: Normal. --Posterior cerebral arteries: Normal. ANTERIOR CIRCULATION: --Intracranial internal carotid arteries: Normal. --Anterior cerebral arteries (ACA): Normal. --Middle cerebral arteries (MCA): Normal. ANATOMIC VARIANTS: Fetal origin of the left PCA. MRA NECK FINDINGS Normal carotid and vertebral arteries. IMPRESSION: 1. Numerous bilateral acute infarcts that are in a predominantly deep watershed pattern. 2. Petechial hemorrhage in the right parietal lobe, right occipital lobe and both cerebellar hemispheres. 3. No emergent large vessel occlusion or high-grade stenosis. Electronically Signed   By: Deatra Robinson M.D.   On: 02/20/2020 03:04   MR ANGIO NECK WO CONTRAST  Result Date: 02/20/2020 CLINICAL DATA:  Encephalopathy EXAM: MRI HEAD WITHOUT CONTRAST MRA HEAD WITHOUT CONTRAST MRA NECK WITHOUT CONTRAST TECHNIQUE: Multiplanar, multiecho pulse sequences of the brain and surrounding structures were obtained without intravenous contrast. Angiographic images of  the Circle of Willis were obtained using MRA technique without intravenous contrast. Angiographic images of the neck were obtained using MRA technique without intravenous contrast. Carotid stenosis measurements (when applicable) are obtained utilizing NASCET criteria, using the distal internal carotid diameter as the denominator. COMPARISON:  None. FINDINGS: MRI HEAD FINDINGS Brain: Numerous bilateral acute infarcts that are in a predominantly deep watershed pattern. Additionally, there are large bilateral cerebellar infarcts. Multifocal cytotoxic edema. No midline shift or other mass effect. There is petechial hemorrhage in the right parietal lobe, right occipital lobe and both cerebellar hemispheres. Normal volume of CSF spaces. Vascular: Major flow voids are preserved. Skull and upper cervical spine: Small subgaleal hematoma at the scalp vertex. Sinuses/Orbits:No paranasal sinus fluid levels or advanced mucosal thickening. No mastoid or middle ear effusion. Normal orbits. MRA HEAD FINDINGS POSTERIOR CIRCULATION: --Vertebral arteries: Normal --Inferior cerebellar arteries: Normal. --Basilar artery: Normal. --Superior cerebellar arteries: Normal. --Posterior cerebral arteries: Normal. ANTERIOR CIRCULATION: --Intracranial internal carotid arteries: Normal. --Anterior cerebral arteries (ACA): Normal. --Middle cerebral arteries (MCA): Normal. ANATOMIC VARIANTS: Fetal origin of the left PCA. MRA NECK FINDINGS Normal carotid and vertebral arteries. IMPRESSION: 1. Numerous bilateral acute infarcts that are in a predominantly deep watershed pattern. 2. Petechial hemorrhage in the right parietal lobe, right occipital lobe and both cerebellar hemispheres. 3. No emergent large vessel occlusion or high-grade stenosis. Electronically Signed   By: Deatra Robinson M.D.   On: 02/20/2020 03:04   MR BRAIN WO CONTRAST  Result Date: 02/20/2020 CLINICAL DATA:  Encephalopathy EXAM: MRI HEAD WITHOUT CONTRAST MRA HEAD WITHOUT CONTRAST  MRA NECK WITHOUT CONTRAST TECHNIQUE: Multiplanar, multiecho pulse sequences of the brain and surrounding structures were obtained without intravenous contrast. Angiographic images of the Circle of Willis were obtained using MRA technique without intravenous contrast. Angiographic images of the neck were obtained using MRA technique without intravenous contrast. Carotid stenosis measurements (when applicable) are obtained utilizing NASCET criteria, using the distal internal carotid diameter as the denominator. COMPARISON:  None. FINDINGS: MRI HEAD FINDINGS Brain: Numerous bilateral acute infarcts that are in a predominantly deep watershed pattern. Additionally, there are large bilateral cerebellar infarcts. Multifocal cytotoxic edema. No midline shift or other mass effect. There is petechial hemorrhage in the right parietal lobe, right occipital lobe and both cerebellar hemispheres. Normal volume of CSF spaces. Vascular: Major flow voids are  preserved. Skull and upper cervical spine: Small subgaleal hematoma at the scalp vertex. Sinuses/Orbits:No paranasal sinus fluid levels or advanced mucosal thickening. No mastoid or middle ear effusion. Normal orbits. MRA HEAD FINDINGS POSTERIOR CIRCULATION: --Vertebral arteries: Normal --Inferior cerebellar arteries: Normal. --Basilar artery: Normal. --Superior cerebellar arteries: Normal. --Posterior cerebral arteries: Normal. ANTERIOR CIRCULATION: --Intracranial internal carotid arteries: Normal. --Anterior cerebral arteries (ACA): Normal. --Middle cerebral arteries (MCA): Normal. ANATOMIC VARIANTS: Fetal origin of the left PCA. MRA NECK FINDINGS Normal carotid and vertebral arteries. IMPRESSION: 1. Numerous bilateral acute infarcts that are in a predominantly deep watershed pattern. 2. Petechial hemorrhage in the right parietal lobe, right occipital lobe and both cerebellar hemispheres. 3. No emergent large vessel occlusion or high-grade stenosis. Electronically Signed   By:  Deatra Robinson M.D.   On: 02/20/2020 03:04   DG Chest Portable 1 View  Result Date: 02/29/2020 CLINICAL DATA:  Hypoxia EXAM: PORTABLE CHEST 1 VIEW COMPARISON:  December 11, 2014 FINDINGS: Endotracheal tube tip is 5.3 cm above the carina. Nasogastric tube tip and side port are below the diaphragm. No pneumothorax. There is airspace opacity in the right mid lung. Left lung clear. Heart size and pulmonary vascularity are normal. No adenopathy. No bone lesions. IMPRESSION: Tube positions as described without pneumothorax. Airspace opacity in the right mid lung is consistent with pneumonia or potential aspiration. Lungs elsewhere clear. Heart size normal. Electronically Signed   By: Bretta Bang III M.D.   On: 03/14/2020 13:11    PHYSICAL EXAM  Temp:  [98 F (36.7 C)-100.6 F (38.1 C)] 99.5 F (37.5 C) (12/02 1300) Pulse Rate:  [40-154] 101 (12/02 1300) Resp:  [16-52] 18 (12/02 1300) BP: (65-165)/(14-121) 129/94 (12/02 1300) SpO2:  [93 %-100 %] 95 % (12/02 1300) FiO2 (%):  [40 %-100 %] 40 % (12/02 1200) Weight:  [108.7 kg] 108.7 kg (12/01 2220)  General - Well nourished, well developed, intubated on precedex  Ophthalmologic - fundi not visualized due to noncooperation.  Cardiovascular - Regular rate and rhythm.  Neuro - intubated on precedex, eyes closed, not following commands. With forced eye opening, eyes in slight b/l abduction position, not blinking to visual threat, doll's eyes present, not tracking, pupils 73mm, sluggish to light. Corneal reflex present bilaterally, gag and cough present. Breathing not over the vent with RR set at 18.  Facial symmetry not able to test due to ET tube.  Tongue protrusion not cooperative. On pain stimulation, no movement of all extremities. DTR diminished and no babinski. Sensation, coordination and gait not tested.   ASSESSMENT/PLAN Mr. KUNIO CUMMISKEY is a 44 y.o. male with history of polysubstance abuse including cocaine, tobacco abuse, right lower  extremity DVT/PE 2011 2013 with lupus anticoagulant positive apparently found on 03/01/2020 in a bed in a motel room, unable to ascertain last known well time, presenting with altered mental status requiring intubation in the ED. Cocaine +.  Stroke:   B watershed infarcts without large vessel disease source in setting of cocaine use w/ possible vasospasm vs hypercoagulable from known lupus anticoagulant + state not on AC vs. ? Endocarditis   CT head Acute/subacute L>R cerebellar, R occipital and R parietal lobe infarcts.   MRI  Numerous B deep watershed infarcts. Petechial hemorrhage R parietal, R occipital, B cerebellar lobes.   MRA head & neck no ELVO  2D Echo EF 55-60%. No source of embolus   EEG severe diffuse encephalopathy. No sz.  LDL 33  HgbA1c 5.6  VTE prophylaxis - SCDs   unknown  AC/antithombotic meds prior to admission, now on ASA 81. Not AC candidate at this time due to large cerebellar infarct.  Therapy recommendations:  pending   Disposition:  pending   Acute Respiratory Failure d/t RML PNA AMS  Intubated in ED  On abx with unaysn  Given pending to rule out meningitis, will recommend meningitis coverage until blood culture back is negative. - not LP candidate due to large b/l cerebellar infarct with 4th ventricle compression.  EEG neg for seizure  Blood culture pending  CCM managing  AKI with renal failure Shock liver Hyperkalemia  Rhabdomyolysis Troponinemia  Cre 5.08->5.58->7.35->8.50  AST/ALT 1447/2253  K 6.4-6.5  CK 2032  Troponin I 7399  Lactic acid 4.1->2.1  Fever T-max 100.6->afebrile  Dysphagia . Secondary to stroke . NPO . Speech on board  Tobacco abuse  Current smoker  Smoking cessation counseling will be provided  Cocaine abuse  UDS positive for cocaine  History of cocaine use  Cessation education will be provided   Other Stroke Risk Factors  Obesity, Body mass index is 31.62 kg/m., BMI >/= 30 associated with  increased stroke risk, recommend weight loss, diet and exercise as appropriate   Family hx early death from CAD  Hx RLE DVT/PE 2011, 2013  Lupus anticoagulant +, not on AC  Other Active Problems  Anion gap metabolic acidosis  NSTEMI d/t vasospasm in setting of cocaine use  Hospital day # 1  This patient is critically ill due to multifocal embolic strokes, altered mental status, respiratory failure, fever, AKI, shock liver, aspiration pneumonia and at significant risk of neurological worsening, death form septic shock, recurrent stroke, hemorrhagic conversion, cerebral edema, renal failure, heart failure, septic shock, seizure. This patient's care requires constant monitoring of vital signs, hemodynamics, respiratory and cardiac monitoring, review of multiple databases, neurological assessment, discussion with family, other specialists and medical decision making of high complexity. I spent 40 minutes of neurocritical care time in the care of this patient.  I discussed with CCM attending and resident.  Marvel PlanJindong Minha Fulco, MD PhD Stroke Neurology 02/20/2020 6:25 PM     To contact Stroke Continuity provider, please refer to [email or web address]. After hours, contact General Neurology

## 2020-02-20 NOTE — Procedures (Signed)
Patient Name: [name]  MRN: [id number]  Epilepsy Attending: Charlsie Quest  Referring Physician/Provider: Emilie Rutter, PA  Date: 02/20/2020 Duration: 22.42 minutes  Patient history: 44 year old male with history of polysubstance abuse with altered mental status.  MRI showed watershed infarct and petechial hemorrhage.  EEG to evaluate for seizures.  Level of alertness: Comatose  AEDs during EEG study: None  Technical aspects: This EEG study was done with scalp electrodes positioned according to the 10-20 International system of electrode placement. Electrical activity was acquired at a sampling rate of 500Hz  and reviewed with a high frequency filter of 70Hz  and a low frequency filter of 1Hz . EEG data were recorded continuously and digitally stored.   Description: No clear posterior dominant rhythm was seen.  EEG showed continued generalized maximal bifrontal 5-7 Hz theta slowing as well as intermittent generalized 2 to 3 Hz delta slowing.  Brief 2 to 3-second periods of generalized EEG attenuation also noted at times.  Hyperventilation and photic stimulation were not performed.     ABNORMALITY -Continuous slow, generalized and maximal bifrontal region  IMPRESSION: This study is suggestive of severe diffuse encephalopathy, nonspecific etiology but could be secondary to sedation.  No seizures or epileptiform discharges were seen throughout the recording.   Jazyiah Yiu 

## 2020-02-20 NOTE — Consult Note (Signed)
Nephrology Consult   Requesting provider: [facility] Service requesting consult: CCM Reason for consult: AKI   Assessment/Recommendations: [name] is a/an 44 y.o. male with a past medical history SLE, DVT, substance use who present w/ severe stroke and AKI   Anuric Renal Failure: Likely secondary to ATN w/ watershed infarcts.  CK not severely elevated.  Possible CKD but no way to now. He has a devastating brain injury. Have not been able to get in touch with family. Supportive care for now and re-eval tomorrow. If no obvious signs of neurological recovery he is unlikely to be a dialysis candidate. -Hyperkalemia mgmt as above -F/u Neuro recs -Continue GOC convos with family if able to reach -No need for fluids at this time, can bolus as needed -Continue to monitor daily Cr, Dose meds for GFR -Monitor Daily I/Os, Daily weight  -Maintain MAP>65 for optimal renal perfusion.  -Avoid nephrotoxic medications including NSAIDs and Vanc/Zosyn combo  Encephalopathy: Watershed stroke.  Awaiting neurology recommendations but prognosis appears poor.  Hyperkalemia: With potassium up to 6.5.  Status post Lokelma.  We will follow up repeat and continue Lokelma over the next 24 hours given our plan to not initiate dialysis at this time.  Metabolic acidosis: Minimal decrease with bicarb of 20.  Mild anion gap at 16.  Likely cause of AKI.  Continue to monitor  Shock liver: AST and ALT elevated likely related to shock state.  Improving.  Recommendations conveyed to primary service.    [facility] 02/20/2020 1:53 PM   _____________________________________________________________________________________ CC: AKI?  History of Present Illness: [name] is a/an 44 y.o. male with a past medical history of SLE, DVT/PE, polysubstance use who presents with acute encephalopathy and acute renal failure  Patient was altered and unable to provide history.  History  was obtained per chart review.   Patient presented on 02/20/2020 after he was found unresponsive in a motel room.  He required bag mask ventilation in route to the emergency department and eventually intubation.  No obvious purposeful movements.  UDS was positive for cocaine.  Lactic acid was initially elevated to 7.  Creatinine on arrival was 5 and increased to 7.4 today.  His AST and ALT were very elevated.  His potassium has been elevated to 6.4 and he was given Lokelma.  His urinalysis shows protein but no significant cellularity.  He has had minimal urine output since admission.  His blood pressures have fluctuated at this current time he has not requiring pressors.  Imaging demonstrated numerous bilateral acute infarcts with deep watershed pattern with small petechial hemorrhages.  Sedation was weaned off prior to my evaluation and he was nonresponsive.  I do not have any old labs indicating a baseline creatinine.  No history of his lupus and whether his kidneys were involved.    Medications:  Current Facility-Administered Medications  Medication Dose Route Frequency Provider Last Rate Last Admin  . 0.9 %  sodium chloride infusion   Intravenous PRN Freddi Starr, MD      . acetaminophen (TYLENOL) tablet 650 mg  650 mg Oral Q6H PRN Mosetta Anis, MD       Or  . acetaminophen (TYLENOL) 160 MG/5ML solution 650 mg  650 mg Per Tube Q6H PRN Mosetta Anis, MD       Or  . acetaminophen (TYLENOL) suppository 650 mg  650 mg Rectal Q6H PRN Mosetta Anis, MD      . chlorhexidine gluconate (  MEDLINE KIT) (PERIDEX) 0.12 % solution 15 mL  15 mL Mouth Rinse BID Collene Gobble, MD   15 mL at 02/20/20 0800  . Chlorhexidine Gluconate Cloth 2 % PADS 6 each  6 each Topical Q0600 Collene Gobble, MD   6 each at 03/11/2020 2219  . dexmedetomidine (PRECEDEX) 400 MCG/100ML (4 mcg/mL) infusion  0.4-1.2 mcg/kg/hr Intravenous Titrated Mosetta Anis, MD 10.87 mL/hr at 02/20/20 1220 0.4 mcg/kg/hr at 02/20/20 1220   . docusate (COLACE) 50 MG/5ML liquid 100 mg  100 mg Per Tube BID PRN Collene Gobble, MD      . feeding supplement (PROSource TF) liquid 90 mL  90 mL Per Tube TID Freddi Starr, MD      . feeding supplement (VITAL 1.5 CAL) liquid 1,000 mL  1,000 mL Per Tube Continuous Freddi Starr, MD      . fentaNYL 2576mg in NS 2554m(1025mml) infusion-PREMIX  0-400 mcg/hr Intravenous Continuous Gleason, LauOtilio CarpenA-C   Stopped at 02/20/20 0830  . insulin aspart (novoLOG) injection 0-6 Units  0-6 Units Subcutaneous Q4H Gleason, LauOtilio CarpenA-C      . MEDLINE mouth rinse  15 mL Mouth Rinse 10 times per day ByrCollene GobbleD   15 mL at 02/20/20 1200  . pantoprazole sodium (PROTONIX) 40 mg/20 mL oral suspension 40 mg  40 mg Per Tube Daily ByrCollene GobbleD   40 mg at 02/20/20 0854  . polyethylene glycol (MIRALAX / GLYCOLAX) packet 17 g  17 g Per Tube Daily PRN Byrum, RobRose FillersD         ALLERGIES Patient has no allergy information on record.  MEDICAL HISTORY No past medical history on file.   SOCIAL HISTORY Social History   Socioeconomic History  . Marital status: Married    Spouse name: Not on file  . Number of children: Not on file  . Years of education: Not on file  . Highest education level: Not on file  Occupational History  . Not on file  Tobacco Use  . Smoking status: Not on file  Substance and Sexual Activity  . Alcohol use: Not on file  . Drug use: Not on file  . Sexual activity: Not on file  Other Topics Concern  . Not on file  Social History Narrative  . Not on file   Social Determinants of Health   Financial Resource Strain:   . Difficulty of Paying Living Expenses: Not on file  Food Insecurity:   . Worried About RunCharity fundraiser the Last Year: Not on file  . Ran Out of Food in the Last Year: Not on file  Transportation Needs:   . Lack of Transportation (Medical): Not on file  . Lack of Transportation (Non-Medical): Not on file  Physical Activity:    . Days of Exercise per Week: Not on file  . Minutes of Exercise per Session: Not on file  Stress:   . Feeling of Stress : Not on file  Social Connections:   . Frequency of Communication with Friends and Family: Not on file  . Frequency of Social Gatherings with Friends and Family: Not on file  . Attends Religious Services: Not on file  . Active Member of Clubs or Organizations: Not on file  . Attends CluArchivistetings: Not on file  . Marital Status: Not on file  Intimate Partner Violence:   . Fear of Current or Ex-Partner: Not on file  .  Emotionally Abused: Not on file  . Physically Abused: Not on file  . Sexually Abused: Not on file     FAMILY HISTORY No family history on file. Unable to obtain due to the patient's AMS and sedation   Review of Systems: Unable to obtain due to the patient's AMS and sedation  Physical Exam: Vitals:   02/20/20 1200 02/20/20 1300  BP: (!) 152/92 (!) 129/94  Pulse: 97 (!) 101  Resp: 17 18  Temp: 99.3 F (37.4 C) 99.5 F (37.5 C)  SpO2: 96% 95%   Total I/O In: 488.8 [I.V.:488.8] Out: 40 [Urine:40]  Intake/Output Summary (Last 24 hours) at 02/20/2020 1353 Last data filed at 02/20/2020 1300 Gross per 24 hour  Intake 3761.37 ml  Output 365 ml  Net 3396.37 ml   General: sedated, intubated, does not respond HEENT: swollen upper lip, ET tube in place, no nasal discharge CV: normal rate, no murmurs, no edema in BLE Lungs: coarse bilateral breath sounds, bilateral chest rise, ventilated Abd: soft, non-tender, non-distended Skin: no visible lesions or rashes Psych: sedated and non responsive Musculoskeletal: no obvious deformities, no joint effusion Neuro: does not respond so unable to perform  Test Results Reviewed Lab Results  Component Value Date   NA 142 02/20/2020   K 6.4 (HH) 02/20/2020   CL 106 02/20/2020   CO2 20 (L) 02/20/2020   BUN 53 (H) 02/20/2020   CREATININE 7.35 (H) 02/20/2020   CALCIUM 8.1 (L)  02/20/2020   ALBUMIN 2.5 (L) 02/20/2020   PHOS 4.7 (H) 02/20/2020     I have reviewed all relevant outside healthcare records related to the patient's current hospitalization

## 2020-02-20 NOTE — Progress Notes (Signed)
Pharmacy Antibiotic Note  [name] is a 44 y.o. male admitted on 03/19/2020 withmeningitis.  Pharmacy has been consulted for Cefepime and Vancomycin dosing. Patient with AKI - CrCl 17 ml/min/  Plan: Start Cefepime 2 gms IV q24hr Vancomycin 2 gms IV x 1, then vanc variable dosing - pharmacy will dose based on changing renal function and possible dialysis Monitor renal function, clinical status, C&S and vanc levels as needed   Height: 6\' 1"  (185.4 cm) Weight: 108.7 kg (239 lb 10.2 oz) IBW/kg (Calculated) : 79.9  Temp (24hrs), Avg:99.9 F (37.7 C), Min:98 F (36.7 C), Max:100.6 F (38.1 C)  Recent Labs  Lab 03/16/2020 1303 03/13/2020 1503 03/17/2020 1624 03/10/2020 1635 03/14/2020 2035 02/20/20 0631 02/20/20 0854  WBC 14.7*  --   --  10.5  --  11.1*  --   CREATININE 5.08*  --  5.58*  --   --  7.35*  --   LATICACIDVEN 7.2* 4.7*  --   --  4.1*  --  2.1*    Estimated Creatinine Clearance: 16.9 mL/min (A) (by C-G formula based on SCr of 7.35 mg/dL (H)).    Not on File  Antimicrobials this admission: Vanc 12/2 >>  Cefep 12/2 >>  Thank you for allowing pharmacy to be a part of this patient's care.  14/2, PharmD, [facility] Clinical Pharmacist Please see AMION for all Pharmacists' Contact Phone Numbers 02/20/2020, 2:06 PM

## 2020-02-20 NOTE — Progress Notes (Signed)
EEG complete - results pending 

## 2020-02-20 NOTE — Progress Notes (Addendum)
Initial Nutrition Assessment  DOCUMENTATION CODES:   Not applicable  INTERVENTION:   Initiate tube feeds via OG tube: - Start Vital 1.5 @ 20 ml/hr and advance by 10 ml q 8 hours to goal rate of 60 ml/hr (1440 ml/day) - ProSource TF 90 ml TID  Tube feeding regimen at goal provides 2400 kcal, 163 grams of protein, and 1100 ml of H2O.  NUTRITION DIAGNOSIS:   Inadequate oral intake related to inability to eat as evidenced by NPO status.  GOAL:   Patient will meet greater than or equal to 90% of their needs  MONITOR:   Vent status, Labs, Weight trends, TF tolerance, I & O's  REASON FOR ASSESSMENT:   Ventilator, Consult Enteral/tube feeding initiation and management  ASSESSMENT:   44 year old male who presented to the ED on 12/01 after a drug overdose. Pt intubated in the ED. PMH of polysubstance abuse including cocaine and tobacco, RLE DVT/PE.   Discussed pt with RN and during ICU rounds. Pt with acute cerebral infarct, acute renal failure, hyperkalemia. Nephrology has been consulted.  Sedation turned off this AM. Per RN, pt remains unresponsive to painful stimulus.  OG tube in fundus of stomach per x-ray, currently to low intermittent suction with bilious output.  Received consult for TF initiation and management. Will start at trickle rate and slowly advance to goal. Discussed with RN.  Patient is currently intubated on ventilator support MV: 11.6 L/min Temp (24hrs), Avg:99.9 F (37.7 C), Min:98 F (36.7 C), Max:100.6 F (38.1 C) BP (cuff): 149/100 MAP (cuff): 115  Drips: Fentanyl: off this AM  Medications reviewed and include: SSI q 4 hours, protonix, IV abx  Labs reviewed: potassium 6.4, BUN 53, creatinine 7.35, ionized calcium 1.14, phosphorus 4.7, elevated LFTs, HDL 27, LDL 189, lactic acid 2.1 CBG's: 102-123 x 24 hours  UOP: 25 ml x 12 hours NGT output: 300 ml x 12 hours I/O's: +3.3 L since admit  NUTRITION - FOCUSED PHYSICAL EXAM:    Most Recent  Value  Orbital Region Mild depletion  Upper Arm Region No depletion  Thoracic and Lumbar Region No depletion  Buccal Region Unable to assess  Temple Region Mild depletion  Clavicle Bone Region No depletion  Clavicle and Acromion Bone Region Mild depletion  Scapular Bone Region Unable to assess  Dorsal Hand No depletion  Patellar Region No depletion  Anterior Thigh Region No depletion  Posterior Calf Region No depletion  Edema (RD Assessment) None  Hair Reviewed  Eyes Unable to assess  Mouth Unable to assess  Skin Reviewed  Nails Reviewed       Diet Order:   Diet Order            Diet NPO time specified  Diet effective now                 EDUCATION NEEDS:   No education needs have been identified at this time  Skin:  Skin Assessment: Reviewed RN Assessment  Last BM:  03/17/2020  Height:   Ht Readings from Last 1 Encounters:  03/13/2020 6\' 1"  (1.854 m)    Weight:   Wt Readings from Last 1 Encounters:  03/02/2020 108.7 kg    Ideal Body Weight:  83.6 kg  BMI:  Body mass index is 31.62 kg/m.  Estimated Nutritional Needs:   Kcal:  2472  Protein:  145-165 grams  Fluid:  >/= 2.0 L    [id number], MS, RD, LDN Inpatient Clinical Dietitian Please see AMiON for contact information.

## 2020-02-21 DIAGNOSIS — E875 Hyperkalemia: Secondary | ICD-10-CM

## 2020-02-21 DIAGNOSIS — F1910 Other psychoactive substance abuse, uncomplicated: Secondary | ICD-10-CM

## 2020-02-21 LAB — COMPREHENSIVE METABOLIC PANEL
ALT: 2184 U/L — ABNORMAL HIGH (ref 0–44)
AST: 681 U/L — ABNORMAL HIGH (ref 15–41)
Albumin: 2.4 g/dL — ABNORMAL LOW (ref 3.5–5.0)
Alkaline Phosphatase: 52 U/L (ref 38–126)
Anion gap: 19 — ABNORMAL HIGH (ref 5–15)
BUN: 82 mg/dL — ABNORMAL HIGH (ref 6–20)
CO2: 19 mmol/L — ABNORMAL LOW (ref 22–32)
Calcium: 8.7 mg/dL — ABNORMAL LOW (ref 8.9–10.3)
Chloride: 102 mmol/L (ref 98–111)
Creatinine, Ser: 11.03 mg/dL — ABNORMAL HIGH (ref 0.61–1.24)
GFR, Estimated: 5 mL/min — ABNORMAL LOW (ref >60)
Glucose, Bld: 129 mg/dL — ABNORMAL HIGH (ref 70–99)
Potassium: 6.1 mmol/L — ABNORMAL HIGH (ref 3.5–5.1)
Sodium: 140 mmol/L (ref 135–145)
Total Bilirubin: 0.8 mg/dL (ref 0.3–1.2)
Total Protein: 5 g/dL — ABNORMAL LOW (ref 6.5–8.1)

## 2020-02-21 LAB — CBC
HCT: 41.4 % (ref 39.0–52.0)
Hemoglobin: 13.9 g/dL (ref 13.0–17.0)
MCH: 29 pg (ref 26.0–34.0)
MCHC: 33.6 g/dL (ref 30.0–36.0)
MCV: 86.3 fL (ref 80.0–100.0)
Platelets: 96 10*3/uL — ABNORMAL LOW (ref 150–400)
RBC: 4.8 MIL/uL (ref 4.22–5.81)
RDW: 14.7 % (ref 11.5–15.5)
WBC: 11.4 10*3/uL — ABNORMAL HIGH (ref 4.0–10.5)
nRBC: 0 % (ref 0.0–0.2)

## 2020-02-21 LAB — GLUCOSE, CAPILLARY
Glucose-Capillary: 127 mg/dL — ABNORMAL HIGH (ref 70–99)
Glucose-Capillary: 126 mg/dL — ABNORMAL HIGH (ref 70–99)
Glucose-Capillary: 131 mg/dL — ABNORMAL HIGH (ref 70–99)
Glucose-Capillary: 112 mg/dL — ABNORMAL HIGH (ref 70–99)
Glucose-Capillary: 118 mg/dL — ABNORMAL HIGH (ref 70–99)
Glucose-Capillary: 121 mg/dL — ABNORMAL HIGH (ref 70–99)

## 2020-02-21 LAB — CK: Total CK: 812 U/L — ABNORMAL HIGH (ref 49–397)

## 2020-02-21 LAB — MAGNESIUM
Magnesium: 2.3 mg/dL (ref 1.7–2.4)
Magnesium: 2.5 mg/dL — ABNORMAL HIGH (ref 1.7–2.4)

## 2020-02-21 LAB — PHOSPHORUS
Phosphorus: 7.8 mg/dL — ABNORMAL HIGH (ref 2.5–4.6)
Phosphorus: 8 mg/dL — ABNORMAL HIGH (ref 2.5–4.6)

## 2020-02-21 LAB — VANCOMYCIN, RANDOM: Vancomycin Rm: 24

## 2020-02-21 MED ORDER — SODIUM ZIRCONIUM CYCLOSILICATE 5 G PO PACK
10.0000 g | PACK | Freq: Three times a day (TID) | ORAL | Status: DC
  Administered 2020-02-21 – 2020-02-22 (×3): 10 g
  Filled 2020-02-21 (×3): qty 2

## 2020-02-21 MED ORDER — ALBUTEROL SULFATE (2.5 MG/3ML) 0.083% IN NEBU
INHALATION_SOLUTION | RESPIRATORY_TRACT | Status: AC
  Filled 2020-02-21: qty 3

## 2020-02-21 MED ORDER — FENTANYL CITRATE (PF) 100 MCG/2ML IJ SOLN
25.0000 ug | INTRAMUSCULAR | Status: DC | PRN
  Administered 2020-02-21 – 2020-02-22 (×5): 100 ug via INTRAVENOUS
  Filled 2020-02-21 (×5): qty 2

## 2020-02-21 MED ORDER — IPRATROPIUM-ALBUTEROL 0.5-2.5 (3) MG/3ML IN SOLN
RESPIRATORY_TRACT | Status: AC
  Administered 2020-02-21: 3 mL
  Filled 2020-02-21: qty 3

## 2020-02-21 NOTE — Progress Notes (Signed)
Had prolonged discussion with [name]'s brother, [name], regarding [name]'s current clinical status. Discussed evidence of irreversible neurological damage and low chance of meaningful recovery. Discussed evidence of multi-organ failure with renal failure and respiratory failure requiring mechanical ventilation. [name] expressed understanding and mentions that he agrees with [name]' poor prognosis and appropriateness of transition to comfort care. [name] request holding off extubation to comfort until he has a chance to visit in person tomorrow. All other questions and concerns addressed.  - Will make DNR/DNI now and transition to comfort once family has opportunity to visit tomorrow.

## 2020-02-21 NOTE — Progress Notes (Addendum)
NAME:  [name], MRN:  [id number], DOB:  [birth year], LOS: 2 ADMISSION DATE:  02/27/2020, CONSULTATION DATE:  03/17/2020 REFERRING MD:  [name], CHIEF COMPLAINT:  AMS   Brief History   [name] is a 44 yo M w/ PMH of polysubstance use, lupus w/ RLE DVT/PE (2011,2013) presenting to [facility] with unresponsiveness and encephalopathy due acute cerebral infarct and renal failure  Past Medical History  Cocaine Use Lupus Major depression  Significant Hospital Events   03/20/2020 Admission  Consults:  Neurology, Nephrology  Procedures:  02/23/2020 Intubated  Significant Diagnostic Tests:  03/05/2020: CT head - No acute hemorrahge 02/20/20: MRA head/neck - bilateral acute infarcts in watershed pattern, petechial hemorrhage in right parietal lobe, occipital lobe and cerebellar hemispheres 02/20/20: EEG - non-specific encephalopathy 02/20/20: Echo - LVEF 55-60, Grade 1 d/d, valves intact, dilated IVC  Micro Data:  03/07/2020: COVID/Flu neg 02/29/2020: Blood culture NGTD  Antimicrobials:  03/14/2020: Unasyn ->Stop 02/20/20 02/20/20: Vanc, Cefepime  Interim history/subjective:  Overnight noted to have productive light brown sputum with high PIP. Treated with albuterol and suctioning. Started on precedex due to frequent coughs and dyssynchronous breathing  [name] was examined and evaluated at bedside this am. He was noted to be minimally responsive to verbal/tactile/painful stimuli. Nursing staff at bedside mentions appear to be intermittently more alert overnight.   Objective   Blood pressure 123/88, pulse 75, temperature 98.8 F (37.1 C), resp. rate 18, height 6\' 1"  (1.854 m), weight 110.7 kg, SpO2 96 %.    Vent Mode: PRVC FiO2 (%):  [40 %-50 %] 50 % Set Rate:  [18 bmp] 18 bmp Vt Set:  [630 mL] 630 mL PEEP:  [8 cmH20] 8 cmH20 Plateau Pressure:  [14 cmH20-23 cmH20] 19 cmH20   Intake/Output Summary (Last 24 hours) at 02/21/2020 0641 Last data filed at 02/21/2020 0600 Gross per 24 hour  Intake 2275.45 ml   Output 490 ml  Net 1785.45 ml   Filed Weights   02/24/2020 1305 03/18/2020 2220 02/21/20 0443  Weight: 114 kg 108.7 kg 110.7 kg   Examination:  Gen: Well-developed, ill-appearing HEENT: NCAT head, Intubated CV: RRR, S1s2 wnl, no m/r/g Pulm: Mild expiratory wheezing, no rales Abd: NTND, Soft Extm: ROM intact, No peripheral edema Skin: Dry, Warm, normal turgor, no rashes, lesions, wounds.  Neuro: Minimally responsive  Resolved Hospital Problem list   N/A  Assessment & Plan:  #Acute metabolic encephalopathy #Watershed Infarcts & petechial hemorrhage MR overnight with new findings of watershed infarct and petechial hemorrhage. Likely due to arrest event. Etiology possibly due to cocaine induced vasospasm vs known hypercoagulable state from lupus. Unclear if have meaningful neurological recovery. Working with case 14/03/21 to find family members. Started on meninginitis coverage per neuro - Appreciate neuro recs - Seizure precautions - Hold anti-coagulation in setting of intracranial bleed - Wean off sedation, assess neuro status  #Acute respiratory failure 2/2 RML pneumonia, encephalopathy Intubated on arrival. Not improved with narcan. Wbc 10.5. Temp peak of 99.29F. Overnight wheezing improved with albuterol. FiO2 40, Peep 8.  - C/w mechanical ventilation - Decrease sedation to assess mental status - PRVC 8cc/kg - VAp prevention - Spontaenous breathing trials if encephalopathy improving  #Acute renal failure #Hyperkalemia Admit BUN 53, Creatinine 7.35, K 6.4. No history of renal disease. Made 400cc urine since admission. Nephro recommend goals of care discussion due to poor dialysis candidate. Am labs pending.  EKG this am with rising T wave peaks but K improving with Lokelma down to 6.1. CK down trending from >  2000->812 this am.  - Appreciate nephro recs - Intermittent EKG - Trend renal function - Avoid nephrotoxic meds when able - C/w lokelma  #NSTEMI Admit troponin with  elevation from 11k->17k. Likely due to vasospasm in setting of cocaine use. UDS + for cocaine. EKG without ST changes. Echocardiogram performed yesterday w/o wall motion abnormalities. Would not be candidate for cath due to renal failure. Current bp stable at 110/80 - Avoid beta-blockade - Counseling for cocaine cessation when encephalopathy resolves  #Elevated transaminsases AST, ALT >2000. Likely due to shock liver.  - Avoid hepatotoxic meds when able - Trend LFTs  #DVT/PE w/ lupus Not on anticoagulation - Hold anticoagulation in setting of intracranial hemorrhage  Best practice (evaluated daily)   Diet: tube feeds Pain/Anxiety/Delirium protocol (if indicated): precedex VAP protocol (if indicated): vanc/cefepime DVT prophylaxis: SCDs GI prophylaxis: PPI Glucose control: ssi Mobility: N/A last date of multidisciplinary goals of care discussion: 02/20/20 Family and staff present: Wife refused participation Summary of discussion: Defer to patient's siblings for goals of care Follow up goals of care discussion due: When able to find siblings Code Status: full code  Labs   CBC: Recent Labs  Lab 03-15-2020 1303 03/15/20 1635 March 15, 2020 1652 02/20/20 0422 02/20/20 0631  WBC 14.7* 10.5  --   --  11.1*  NEUTROABS 11.6*  --   --   --   --   HGB 17.2* 15.6 16.3 15.0 14.8  HCT 52.7* 49.0 48.0 44.0 43.3  MCV 90.9 91.8  --   --  86.8  PLT 153 120*  --   --  99*    Basic Metabolic Panel: Recent Labs  Lab 03/15/20 1303 15-Mar-2020 1624 03-15-20 1635 Mar 15, 2020 1652 02/20/20 0422 02/20/20 0631 02/20/20 1337 02/20/20 1758  NA 147*  --   --  142 139 142 140  --   K 5.7*  --   --  5.9* 6.4* 6.4* 6.5*  --   CL 110  --   --   --   --  106 106  --   CO2 19*  --   --   --   --  20* 20*  --   GLUCOSE 109*  --   --   --   --  104* 111*  --   BUN 32*  --   --   --   --  53* 60*  --   CREATININE 5.08* 5.58*  --   --   --  7.35* 8.50*  --   CALCIUM 8.0*  --   --   --   --  8.1* 8.3*  --    MG  --   --  1.7  --   --  1.9  --  2.0  PHOS  --   --   --   --   --  4.7* 4.8* 5.4*   GFR: Estimated Creatinine Clearance: 14.8 mL/min (A) (by C-G formula based on SCr of 8.5 mg/dL (H)). Recent Labs  Lab 03/15/20 1303 03-15-20 1503 03-15-20 1635 03-15-20 2035 02/20/20 0631 02/20/20 0854  WBC 14.7*  --  10.5  --  11.1*  --   LATICACIDVEN 7.2* 4.7*  --  4.1*  --  2.1*    Liver Function Tests: Recent Labs  Lab 2020/03/15 1303 02/20/20 0854 02/20/20 1337  AST 2,100* 1,447*  --   ALT 2,636* 2,253*  --   ALKPHOS 70 47  --   BILITOT 0.8 0.8  --   PROT 5.3* 4.6*  --  ALBUMIN 3.1* 2.5* 2.6*   Recent Labs  Lab 02/24/2020 1303  LIPASE 26   No results for input(s): AMMONIA in the last 168 hours.  ABG    Component Value Date/Time   PHART 7.313 (L) 02/20/2020 0422   PCO2ART 41.6 02/20/2020 0422   PO2ART 100 02/20/2020 0422   HCO3 21.1 02/20/2020 0422   TCO2 22 02/20/2020 0422   ACIDBASEDEF 5.0 (H) 02/20/2020 0422   O2SAT 97.0 02/20/2020 0422     Coagulation Profile: Recent Labs  Lab 03/06/2020 1624 02/20/20 0631  INR 1.3* 1.5*    Cardiac Enzymes: Recent Labs  Lab 02/28/2020 1624 02/20/20 0631  CKTOTAL 2,152* 2,032*    HbA1C: Hgb A1c MFr Bld  Date/Time Value Ref Range Status  02/25/2020 06:17 PM 5.6 4.8 - 5.6 % Final    Comment:    REPEATED TO VERIFY (NOTE) Pre diabetes:          5.7%-6.4%  Diabetes:              >6.4%  Glycemic control for   <7.0% adults with diabetes     CBG: Recent Labs  Lab 02/20/20 1200 02/20/20 1517 02/20/20 1918 02/20/20 2307 02/21/20 0309  GLUCAP 110* 97 101* 118* 127*    Review of Systems:   Unable to obtain  Past Medical History  Cocaine use Major depressive Disorder Recurrent DVT/PE w/ + lupus coagulopathy  Surgical History   None  Social History   Prior hx of cocaine, THC use. Unstable housing (found in motel)  Family History   His family history is not on file.   Allergies Not on File   Home  Medications  Prior to Admission medications   Not on File   Theotis Barrio, MD 02/21/2020, 7:39 AM PGY-3 [facility] Internal Medicine Pager: [phone]   Pulmonary critical care attending:  Patient seen in conjunction with medical resident Erich Montane, MD documentation as above.  This is a 44 year old polysubstance abuse history, history of right lower extremity DVT PE in 2011 2013 not on anticoagulation at home.  Patient was found down in a hotel unknown downtime cocaine positive UDS. History of polysubstance abuse.  Patient had CT imaging of the head on admission which was negative for acute hemorrhage but had MRA of the head neck with bilateral subacute infarcts in watershed distribution with petechial hemorrhages in the right parietal lobe occipital lobe and cerebellar hemispheres.  At this time patient remains critically ill intubated mechanical life support in the intensive care unit. Unfortunately has had increasing kidney function. He has made some urine. But however nephrology says do the bilateral watershed infarct not a candidate for dialysis and no significant neurologic recovery at this time.  BP 124/85   Pulse 76   Temp 98.2 F (36.8 C) (Bladder)   Resp 20   Ht 6\' 4"  (1.93 m)   Wt 110.7 kg   SpO2 97%   BMI 29.71 kg/m   General: Young male intubated mechanical life support Heart: Regular rhythm, S1-S2 Lungs: Bilateral mechanically ventilated breath sounds Abdomen: Soft nontender nondistended Extremities: No significant edema.  Labs: Reviewed Elevated serum creatinine, hyperkalemia.  Assessment: Acute metabolic encephalopathy secondary to bilateral watershed infarcts and petechial cerebral hemorrhages. Acute hypoxemic respiratory failure requiring intubation mechanical ventilation secondary to inability to protect airway with right middle lobe pneumonia concerning for aspiration. Acute renal failure with hyperkalemia secondary to above. NSTEMI, possibly  substance abuse related. Shock liver Multiorgan failure as described above.  Plan: This is a  young gentleman unfortunately with a very poor prognosis and likely not to survive. Due to his worsening renal dysfunction and has been seen by nephrology. I agree with him that he is not a good candidate for ongoing dialysis support in the setting of bilateral cerebral infarcts in watershed distribution. There is however difficulty in obtaining a medical decision maker for the patient. We have got a hold of the patient's wife who has been estranged from him for greater than 4 years. She has declined wanting anything to do with his medical decisions and have deferred that to his siblings. We do not have the sibling's telephone information we do have a place of work. We working with case management. This will help us define a medical decision maker. In the meantime we will continue his current course. Continue scheduled Lokelma to help with hyperkalemia. Hold all continuous sedation at this time.  Goals today are to help the final decision maker. Consult placed to palliative care.  This patient is critically ill with multiple organ system failure; which, requires frequent high complexity decision making, assessment, support, evaluation, and titration of therapies. This was completed through the application of advanced monitoring technologies and extensive interpretation of multiple databases. During this encounter critical care time was devoted to patient care services described in this note for 32 minutes.  Josephine IgoBradley L Vicktoria Muckey, DO [facility] 02/21/2020 11:59 AM

## 2020-02-21 NOTE — Progress Notes (Signed)
PIP noted to be high.  Suctioned for copious amounts of dark tan/light brown secretions with cough.    Expiratory monophonic/polyphonic wheezing with mildly prolonged expiratory phase noted after thorough pulmonary toilet and one-time dose of albuterol administered.  Pt began spontaneously coughing and suctioned again for more copious light brown secretions approximately 30 seconds in to aerosol treatment and adventitious breath sounds and high PIP resolved.  I do not attribute this resolution to the administration of the bronchodilators and suspect residual secretions were the cause.

## 2020-02-21 NOTE — Consult Note (Signed)
Palliative Care Consult Note   [name] is a 44 yo man with a substance abuse history admitted after being found down in a motel room and significant hypoxic brain injury. He is currently unresponsive, on vent support and has no signs of neurological recovery. Staff were able to get in touch with his spouse but they are in the middle of a divorce and she has refused to participate in medical decision making. There are no known next of kin or decision makers known at the time of my consultation.  I discussed his case in detail with Dr. Tonia Brooms. His prognosis is extremely poor and he will not survive this hospitalization.  Recommendations:  It is my professional opinion that with a high degree of medical certainty that this patient has an incurable and irreversible condition that will result in his death within a relatively short period of time. All medically appropriate and reasonable medical interventions have and are being done according to accepted practice and standards of care. In the event of a cardiac or respiratory arrest, an attempt at resuscitation would be unsuccessful at returning this patient to an already fragile and seriously ill state of health and cause unnecessary suffering at the end of his life. I support placing and maintaining a DNR order and support the care plan as outlined by the primary service.  Continue to do due diligence search for [facility] or reasonable surrogate decision maker, otherwise the medical team will be responsible for determining his care.  A transition to comfort measures only would be medically reasonable and a compassionate and humane choice.  Anderson Malta, DO Palliative Medicine  Time: 30 min Greater than 50%  of this time was spent counseling and coordinating care related to the above assessment and plan.

## 2020-02-21 NOTE — Progress Notes (Signed)
Pharmacy Antibiotic Note  [name] is a 44 y.o. male admitted on 03/17/2020 withmeningitis.  Pharmacy has been consulted for Cefepime and Vancomycin dosing.   Patient with worsening AKI - SCr up to 11 and minimal urine output. Estimated CrCl ~11.9 mL/min.  Patient received Vancomycin 2000 mg loading dose on 12/2 at 1500.  24 hour vancomycin random level is 24. No scheduled Vancomycin ordered.  Currently no plans for dialysis at this time.   Plan: Continue Cefepime 2 gms IV q24hr.  No further Vancomycin today.  Recheck Vancomycin random level in AM to determine rate of decline if any as SCr continues to rise.  Follow-up renal plans.  Monitor renal function, clinical status, C&S and vanc levels as needed   Height: 6\' 4"  (193 cm) Weight: 110.7 kg (244 lb 0.8 oz) IBW/kg (Calculated) : 86.8  Temp (24hrs), Avg:99.2 F (37.3 C), Min:97.7 F (36.5 C), Max:100.2 F (37.9 C)  Recent Labs  Lab 02/27/2020 1303 02/23/2020 1503 02/29/2020 1624 03/14/2020 1635 03/01/2020 2035 02/20/20 0631 02/20/20 0854 02/20/20 1337 02/21/20 0649 02/21/20 1540  WBC 14.7*  --   --  10.5  --  11.1*  --   --  11.4*  --   CREATININE 5.08*  --  5.58*  --   --  7.35*  --  8.50* 11.03*  --   LATICACIDVEN 7.2* 4.7*  --   --  4.1*  --  2.1*  --   --   --   VANCORANDOM  --   --   --   --   --   --   --   --   --  24    Estimated Creatinine Clearance: 11.9 mL/min (A) (by C-G formula based on SCr of 11.03 mg/dL (H)).    Not on File  Antimicrobials this admission: Vanc 12/2 >>  Cefep 12/2 >>  Thank you for allowing pharmacy to be a part of this patient's care.  14/2, PharmD, BCPS, BCCCP Clinical Pharmacist Please refer to [facility] for [facility] Pharmacy numbers 02/21/2020, 5:01 PM

## 2020-02-21 NOTE — Progress Notes (Addendum)
1pm: CSW received return call from [place] at [place] in [place] - [name] no longer works at that location and has relocated to the [place], [place] office.  CSW spoke with Lurena Joiner at the [place] office who provided CSW with contact information for [employer]. CSW sent secure email to [email or web address] requesting information on the patient's sister.  12pm: CSW was asked by MD to assist in locating this patient's family members.  CSW spoke with patient's former spouse Marylene Land who provided the patient's sister - [name], who is reportedly employed at [employer] in [place] and the patient's brother [name], a cook at [employer] (unknown which site). Marylene Land states she spoke with the patient's brother yesterday who did not want to be involved in the patient's decision making.  CSW attempted to locate these family members on the patient's social media accounts without success.  CSW attempted to speak with Judeth Cornfield - she is not currently at work, a Engineer, technical sales was left with her supervisor Allegra requesting a return call. CSW reviewed Epic and located a [name] from [place] - attempted call to [phone], no answer and no voicemail left.  Edwin Dada, MSW, LCSW-A Transitions of Care  Clinical Social Worker I [facility] Emergency Departments  Medical ICU [phone]

## 2020-02-21 NOTE — Plan of Care (Signed)
Note reviewed. Pt now DNR/DNI and transitioned to comfort care only. Neurology will sign off, please call with questions. Thanks for the consult.  Marvel Plan, MD PhD Stroke Neurology 02/21/2020 6:24 PM

## 2020-02-21 NOTE — Progress Notes (Signed)
Previous shift spoke with Mr. Cott wife who was the contact on file. Mr. Sayed wife said that she hadn't had a relationship with him for several years and that she was filing for divorce and did not want to be involved.  Spoke with Cleda Mccreedy, Mr. Bonura girlfriend/fiance who said that she wants to make decisions and that she is the only one that cares about him. RN informed her that she could only do that if Mr. Helsley living relatives (brother and sister) agreed.   Spoke with Mr. Candelas brother who said that he would like an update from the Doctor. Message passed to Dr. Nedra Hai and Dr. Tonia Brooms who will speak with the brother.

## 2020-02-21 NOTE — Progress Notes (Signed)
Patient ID: [name], male   DOB: March 11, 1976, 44 y.o.   MRN: [id number] S: No events overnight. O:BP 124/85   Pulse 76   Temp 98.2 F (36.8 C) (Bladder)   Resp 20   Ht '6\' 4"'  (1.93 m)   Wt 110.7 kg   SpO2 97%   BMI 29.71 kg/m   Intake/Output Summary (Last 24 hours) at 02/21/2020 1354 Last data filed at 02/21/2020 1300 Gross per 24 hour  Intake 1822.49 ml  Output 540 ml  Net 1282.49 ml   Intake/Output: I/O last 3 completed shifts: In: 4309.4 [I.V.:2949.4; NG/GT:660; IV Piggyback:700] Out: 51 [Urine:465; Emesis/NG output:350]  Intake/Output this shift:  Total I/O In: 274.5 [I.V.:34.5; NG/GT:240] Out: 140 [Urine:140] Weight change: -3.3 kg Gen: Intubated and unresponsive without sedation CVS: RRR Resp: scattered rhonchi Abd: +BS, soft Ext: no edema  Recent Labs  Lab 03/20/2020 1303 03/07/2020 1624 02/29/2020 1652 02/20/20 0422 02/20/20 0631 02/20/20 0854 02/20/20 1337 02/20/20 1758 02/21/20 0649  NA 147*  --  142 139 142  --  140  --  140  K 5.7*  --  5.9* 6.4* 6.4*  --  6.5*  --  6.1*  CL 110  --   --   --  106  --  106  --  102  CO2 19*  --   --   --  20*  --  20*  --  19*  GLUCOSE 109*  --   --   --  104*  --  111*  --  129*  BUN 32*  --   --   --  53*  --  60*  --  82*  CREATININE 5.08* 5.58*  --   --  7.35*  --  8.50*  --  11.03*  ALBUMIN 3.1*  --   --   --   --  2.5* 2.6*  --  2.4*  CALCIUM 8.0*  --   --   --  8.1*  --  8.3*  --  8.7*  PHOS  --   --   --   --  4.7*  --  4.8* 5.4* 7.8*  AST 2,100*  --   --   --   --  1,447*  --   --  681*  ALT 2,636*  --   --   --   --  2,253*  --   --  2,184*   Liver Function Tests: Recent Labs  Lab 03/04/2020 1303 03/15/2020 1303 02/20/20 0854 02/20/20 1337 02/21/20 0649  AST 2,100*  --  1,447*  --  681*  ALT 2,636*  --  2,253*  --  2,184*  ALKPHOS 70  --  47  --  52  BILITOT 0.8  --  0.8  --  0.8  PROT 5.3*  --  4.6*  --  5.0*  ALBUMIN 3.1*   < > 2.5* 2.6* 2.4*   < > = values in this interval not displayed.    Recent Labs  Lab 03/19/2020 1303  LIPASE 26   No results for input(s): AMMONIA in the last 168 hours. CBC: Recent Labs  Lab 03/20/2020 1303 03/07/2020 1303 03/12/2020 1635 03/08/2020 1652 02/20/20 0422 02/20/20 0631 02/21/20 0649  WBC 14.7*   < > 10.5  --   --  11.1* 11.4*  NEUTROABS 11.6*  --   --   --   --   --   --   HGB 17.2*   < > 15.6   < >  15.0 14.8 13.9  HCT 52.7*   < > 49.0   < > 44.0 43.3 41.4  MCV 90.9  --  91.8  --   --  86.8 86.3  PLT 153   < > 120*  --   --  99* 96*   < > = values in this interval not displayed.   Cardiac Enzymes: Recent Labs  Lab 03/12/2020 1624 02/20/20 0631 02/21/20 0649  CKTOTAL 2,152* 2,032* 812*   CBG: Recent Labs  Lab 02/20/20 1918 02/20/20 2307 02/21/20 0309 02/21/20 0712 02/21/20 1112  GLUCAP 101* 118* 127* 126* 131*    Iron Studies: No results for input(s): IRON, TIBC, TRANSFERRIN, FERRITIN in the last 72 hours. Studies/Results: DG Cervical Spine 1 View  Result Date: 03/17/2020 CLINICAL DATA:  Intubated, preprocedure evaluation for MRI EXAM: DG CERVICAL SPINE - 1 VIEW COMPARISON:  None. FINDINGS: Two lateral views of the cervical spine are obtained. The cervical spine is in anatomic alignment to the C6/C7 disc space. Endotracheal tube and enteric catheter are identified. Diffuse dental amalgam. No unexpected radiopaque foreign bodies. IMPRESSION: 1. Support devices and dental amalgam as above. No unexpected radiopaque foreign bodies. Electronically Signed   By: Randa Ngo M.D.   On: 03/18/2020 20:10   DG Pelvis 1-2 Views  Result Date: 03/04/2020 CLINICAL DATA:  Abnormal head CT, preprocedure evaluation for MRI EXAM: PELVIS - 1-2 VIEW COMPARISON:  None. FINDINGS: Two frontal views of the pelvis demonstrate Foley catheter. Rounded calcification left gluteal region may reflect injection granulomata. No unexpected radiopaque foreign bodies. No acute bony abnormalities. Soft tissues are unremarkable. IMPRESSION: 1. Foley catheter as  above. No unexpected radiopaque foreign bodies. Electronically Signed   By: Randa Ngo M.D.   On: 03/17/2020 20:11   DG Abd 1 View  Result Date: 03/19/2020 CLINICAL DATA:  Abnormal head CT, preprocedure evaluation for MRI EXAM: ABDOMEN - 1 VIEW COMPARISON:  None. FINDINGS: Frontal view of the lower chest and upper abdomen excludes the right lateral chest wall by collimation. Enteric catheter tip and side port project over the gastric fundus. Stable right perihilar airspace disease. Bowel gas pattern is unremarkable. No unexpected radiopaque foreign bodies. IMPRESSION: 1. Enteric catheter as above. Otherwise no unexpected radiopaque foreign bodies. 2. Stable asymmetric right-sided airspace disease which may reflect aspiration or pneumonia. Electronically Signed   By: Randa Ngo M.D.   On: 02/25/2020 20:09   CT Head Wo Contrast  Result Date: 03/16/2020 CLINICAL DATA:  Found unresponsive EXAM: CT HEAD WITHOUT CONTRAST TECHNIQUE: Contiguous axial images were obtained from the base of the skull through the vertex without intravenous contrast. COMPARISON:  2011 FINDINGS: Brain: There is no acute intracranial hemorrhage. There is hypoattenuation in the peripheral left greater than right cerebellum. There is likely small area of right occipital involvement with loss of gray-white differentiation. Additional area of loss of gray-white differentiation is suspected right parietal lobe. No hydrocephalus or herniation.  There is no extra-axial collection. Vascular: No hyperdense vessel or unexpected calcification. Skull: Calvarium is unremarkable. Sinuses/Orbits: Patchy mucosal thickening.  Orbits are unremarkable. Other: None. IMPRESSION: No acute intracranial hemorrhage. Areas of acute/subacute infarction in the left greater than right cerebellum, right occipital lobe, and right parietal lobe. These results were called by telephone at the time of interpretation on 02/23/2020 at 3:20 pm to provider Veryl Speak ,  who verbally acknowledged these results. Electronically Signed   By: Macy Mis M.D.   On: 03/17/2020 15:22   MR ANGIO HEAD WO CONTRAST  Result  Date: 02/20/2020 CLINICAL DATA:  Encephalopathy EXAM: MRI HEAD WITHOUT CONTRAST MRA HEAD WITHOUT CONTRAST MRA NECK WITHOUT CONTRAST TECHNIQUE: Multiplanar, multiecho pulse sequences of the brain and surrounding structures were obtained without intravenous contrast. Angiographic images of the Circle of Willis were obtained using MRA technique without intravenous contrast. Angiographic images of the neck were obtained using MRA technique without intravenous contrast. Carotid stenosis measurements (when applicable) are obtained utilizing NASCET criteria, using the distal internal carotid diameter as the denominator. COMPARISON:  None. FINDINGS: MRI HEAD FINDINGS Brain: Numerous bilateral acute infarcts that are in a predominantly deep watershed pattern. Additionally, there are large bilateral cerebellar infarcts. Multifocal cytotoxic edema. No midline shift or other mass effect. There is petechial hemorrhage in the right parietal lobe, right occipital lobe and both cerebellar hemispheres. Normal volume of CSF spaces. Vascular: Major flow voids are preserved. Skull and upper cervical spine: Small subgaleal hematoma at the scalp vertex. Sinuses/Orbits:No paranasal sinus fluid levels or advanced mucosal thickening. No mastoid or middle ear effusion. Normal orbits. MRA HEAD FINDINGS POSTERIOR CIRCULATION: --Vertebral arteries: Normal --Inferior cerebellar arteries: Normal. --Basilar artery: Normal. --Superior cerebellar arteries: Normal. --Posterior cerebral arteries: Normal. ANTERIOR CIRCULATION: --Intracranial internal carotid arteries: Normal. --Anterior cerebral arteries (ACA): Normal. --Middle cerebral arteries (MCA): Normal. ANATOMIC VARIANTS: Fetal origin of the left PCA. MRA NECK FINDINGS Normal carotid and vertebral arteries. IMPRESSION: 1. Numerous bilateral acute  infarcts that are in a predominantly deep watershed pattern. 2. Petechial hemorrhage in the right parietal lobe, right occipital lobe and both cerebellar hemispheres. 3. No emergent large vessel occlusion or high-grade stenosis. Electronically Signed   By: Ulyses Jarred M.D.   On: 02/20/2020 03:04   MR ANGIO NECK WO CONTRAST  Result Date: 02/20/2020 CLINICAL DATA:  Encephalopathy EXAM: MRI HEAD WITHOUT CONTRAST MRA HEAD WITHOUT CONTRAST MRA NECK WITHOUT CONTRAST TECHNIQUE: Multiplanar, multiecho pulse sequences of the brain and surrounding structures were obtained without intravenous contrast. Angiographic images of the Circle of Willis were obtained using MRA technique without intravenous contrast. Angiographic images of the neck were obtained using MRA technique without intravenous contrast. Carotid stenosis measurements (when applicable) are obtained utilizing NASCET criteria, using the distal internal carotid diameter as the denominator. COMPARISON:  None. FINDINGS: MRI HEAD FINDINGS Brain: Numerous bilateral acute infarcts that are in a predominantly deep watershed pattern. Additionally, there are large bilateral cerebellar infarcts. Multifocal cytotoxic edema. No midline shift or other mass effect. There is petechial hemorrhage in the right parietal lobe, right occipital lobe and both cerebellar hemispheres. Normal volume of CSF spaces. Vascular: Major flow voids are preserved. Skull and upper cervical spine: Small subgaleal hematoma at the scalp vertex. Sinuses/Orbits:No paranasal sinus fluid levels or advanced mucosal thickening. No mastoid or middle ear effusion. Normal orbits. MRA HEAD FINDINGS POSTERIOR CIRCULATION: --Vertebral arteries: Normal --Inferior cerebellar arteries: Normal. --Basilar artery: Normal. --Superior cerebellar arteries: Normal. --Posterior cerebral arteries: Normal. ANTERIOR CIRCULATION: --Intracranial internal carotid arteries: Normal. --Anterior cerebral arteries (ACA): Normal.  --Middle cerebral arteries (MCA): Normal. ANATOMIC VARIANTS: Fetal origin of the left PCA. MRA NECK FINDINGS Normal carotid and vertebral arteries. IMPRESSION: 1. Numerous bilateral acute infarcts that are in a predominantly deep watershed pattern. 2. Petechial hemorrhage in the right parietal lobe, right occipital lobe and both cerebellar hemispheres. 3. No emergent large vessel occlusion or high-grade stenosis. Electronically Signed   By: Ulyses Jarred M.D.   On: 02/20/2020 03:04   MR BRAIN WO CONTRAST  Result Date: 02/20/2020 CLINICAL DATA:  Encephalopathy EXAM: MRI HEAD WITHOUT CONTRAST MRA HEAD WITHOUT CONTRAST  MRA NECK WITHOUT CONTRAST TECHNIQUE: Multiplanar, multiecho pulse sequences of the brain and surrounding structures were obtained without intravenous contrast. Angiographic images of the Circle of Willis were obtained using MRA technique without intravenous contrast. Angiographic images of the neck were obtained using MRA technique without intravenous contrast. Carotid stenosis measurements (when applicable) are obtained utilizing NASCET criteria, using the distal internal carotid diameter as the denominator. COMPARISON:  None. FINDINGS: MRI HEAD FINDINGS Brain: Numerous bilateral acute infarcts that are in a predominantly deep watershed pattern. Additionally, there are large bilateral cerebellar infarcts. Multifocal cytotoxic edema. No midline shift or other mass effect. There is petechial hemorrhage in the right parietal lobe, right occipital lobe and both cerebellar hemispheres. Normal volume of CSF spaces. Vascular: Major flow voids are preserved. Skull and upper cervical spine: Small subgaleal hematoma at the scalp vertex. Sinuses/Orbits:No paranasal sinus fluid levels or advanced mucosal thickening. No mastoid or middle ear effusion. Normal orbits. MRA HEAD FINDINGS POSTERIOR CIRCULATION: --Vertebral arteries: Normal --Inferior cerebellar arteries: Normal. --Basilar artery: Normal. --Superior  cerebellar arteries: Normal. --Posterior cerebral arteries: Normal. ANTERIOR CIRCULATION: --Intracranial internal carotid arteries: Normal. --Anterior cerebral arteries (ACA): Normal. --Middle cerebral arteries (MCA): Normal. ANATOMIC VARIANTS: Fetal origin of the left PCA. MRA NECK FINDINGS Normal carotid and vertebral arteries. IMPRESSION: 1. Numerous bilateral acute infarcts that are in a predominantly deep watershed pattern. 2. Petechial hemorrhage in the right parietal lobe, right occipital lobe and both cerebellar hemispheres. 3. No emergent large vessel occlusion or high-grade stenosis. Electronically Signed   By: Ulyses Jarred M.D.   On: 02/20/2020 03:04   EEG adult  Result Date: 02/20/2020 Lora Havens, MD     02/20/2020 12:19 PM Patient Name: [name] MRN: [id number] Epilepsy Attending: Lora Havens Referring Physician/Provider: Elease Etienne, PA Date: 02/20/2020 Duration: 22.42 minutes Patient history: 44 year old male with history of polysubstance abuse with altered mental status.  MRI showed watershed infarct and petechial hemorrhage.  EEG to evaluate for seizures. Level of alertness: Comatose AEDs during EEG study: None Technical aspects: This EEG study was done with scalp electrodes positioned according to the 10-20 International system of electrode placement. Electrical activity was acquired at a sampling rate of '500Hz'  and reviewed with a high frequency filter of '70Hz'  and a low frequency filter of '1Hz' . EEG data were recorded continuously and digitally stored. Description: No clear posterior dominant rhythm was seen.  EEG showed continued generalized maximal bifrontal 5-7 Hz theta slowing as well as intermittent generalized 2 to 3 Hz delta slowing.  Brief 2 to 3-second periods of generalized EEG attenuation also noted at times.  Hyperventilation and photic stimulation were not performed.   ABNORMALITY -Continuous slow, generalized and maximal bifrontal region IMPRESSION: This study is  suggestive of severe diffuse encephalopathy, nonspecific etiology but could be secondary to sedation.  No seizures or epileptiform discharges were seen throughout the recording. Lora Havens   ECHOCARDIOGRAM COMPLETE  Result Date: 02/20/2020    ECHOCARDIOGRAM REPORT   Patient Name:   [name] Date of Exam: 02/20/2020 Medical Rec #:  [id number]    Height:       73.0 in Accession #:    [id number]   Weight:       239.6 lb Date of Birth:  [birth year]    BSA:          2.324 m Patient Age:    44 years     BP:           104/72 mmHg Patient Gender:  M            HR:           96 bpm. Exam Location:  Inpatient Procedure: 2D Echo, Cardiac Doppler and Color Doppler Indications:    Cardiac arrest  History:        Patient has no prior history of Echocardiogram examinations.                 Arrythmias:Cardiac Arrest, Signs/Symptoms:Syncope; Risk                 Factors:Current Smoker. Polysubstance abuse, resp. failure.  Sonographer:    Dustin Flock Referring Phys: [id number] Francesca Jewett  Sonographer Comments: Echo performed with patient supine and on artificial respirator. IMPRESSIONS  1. Left ventricular ejection fraction, by estimation, is 55 to 60%. The left ventricle has normal function. The left ventricle has no regional wall motion abnormalities. There is mild left ventricular hypertrophy. Left ventricular diastolic parameters are consistent with Grade I diastolic dysfunction (impaired relaxation).  2. Right ventricular systolic function is normal. The right ventricular size is normal.  3. The mitral valve is normal in structure. No evidence of mitral valve regurgitation. No evidence of mitral stenosis.  4. The aortic valve is tricuspid. Aortic valve regurgitation is not visualized. No aortic stenosis is present.  5. The inferior vena cava is dilated in size with <50% respiratory variability, suggesting right atrial pressure of 15 mmHg. FINDINGS  Left Ventricle: Left ventricular ejection fraction, by  estimation, is 55 to 60%. The left ventricle has normal function. The left ventricle has no regional wall motion abnormalities. The left ventricular internal cavity size was normal in size. There is  mild left ventricular hypertrophy. Left ventricular diastolic parameters are consistent with Grade I diastolic dysfunction (impaired relaxation). Right Ventricle: The right ventricular size is normal.Right ventricular systolic function is normal. Left Atrium: Left atrial size was normal in size. Right Atrium: Right atrial size was normal in size. Pericardium: Trivial pericardial effusion is present. Mitral Valve: The mitral valve is normal in structure. No evidence of mitral valve regurgitation. No evidence of mitral valve stenosis. Tricuspid Valve: The tricuspid valve is normal in structure. Tricuspid valve regurgitation is trivial. No evidence of tricuspid stenosis. Aortic Valve: The aortic valve is tricuspid. Aortic valve regurgitation is not visualized. No aortic stenosis is present. Pulmonic Valve: The pulmonic valve was not well visualized. Pulmonic valve regurgitation is not visualized. No evidence of pulmonic stenosis. Aorta: The aortic root is normal in size and structure. Venous: The inferior vena cava is dilated in size with less than 50% respiratory variability, suggesting right atrial pressure of 15 mmHg. IAS/Shunts: No atrial level shunt detected by color flow Doppler.  LEFT VENTRICLE PLAX 2D LVIDd:         4.30 cm  Diastology LVIDs:         3.10 cm  LV e' medial:    5.66 cm/s LV PW:         1.30 cm  LV E/e' medial:  10.9 LV IVS:        1.20 cm  LV e' lateral:   8.92 cm/s LVOT diam:     2.30 cm  LV E/e' lateral: 6.9 LV SV:         64 LV SV Index:   28 LVOT Area:     4.15 cm  RIGHT VENTRICLE RV Basal diam:  3.50 cm RV S prime:     5.00 cm/s TAPSE (M-mode): 1.5 cm LEFT  ATRIUM             Index       RIGHT ATRIUM           Index LA diam:        3.10 cm 1.33 cm/m  RA Area:     12.20 cm LA Vol (A2C):   44.5  ml 19.15 ml/m RA Volume:   29.90 ml  12.87 ml/m LA Vol (A4C):   25.7 ml 11.06 ml/m LA Biplane Vol: 35.4 ml 15.24 ml/m  AORTIC VALVE LVOT Vmax:   105.00 cm/s LVOT Vmean:  64.600 cm/s LVOT VTI:    0.154 m  AORTA Ao Root diam: 3.40 cm MITRAL VALVE MV Area (PHT): 5.27 cm    SHUNTS MV Decel Time: 144 msec    Systemic VTI:  0.15 m MV E velocity: 61.70 cm/s  Systemic Diam: 2.30 cm MV A velocity: 57.80 cm/s MV E/A ratio:  1.07 Kirk Ruths MD Electronically signed by Kirk Ruths MD Signature Date/Time: 02/20/2020/11:34:46 AM    Final    . aspirin  81 mg Oral Daily  . chlorhexidine gluconate (MEDLINE KIT)  15 mL Mouth Rinse BID  . Chlorhexidine Gluconate Cloth  6 each Topical Q0600  . feeding supplement (PROSource TF)  90 mL Per Tube TID  . insulin aspart  0-6 Units Subcutaneous Q4H  . mouth rinse  15 mL Mouth Rinse 10 times per day  . pantoprazole sodium  40 mg Per Tube Daily  . sodium zirconium cyclosilicate  10 g Per Tube TID  . vancomycin variable dose per unstable renal function (pharmacist dosing)   Does not apply See admin instructions    BMET    Component Value Date/Time   NA 140 02/21/2020 0649   K 6.1 (H) 02/21/2020 0649   CL 102 02/21/2020 0649   CO2 19 (L) 02/21/2020 0649   GLUCOSE 129 (H) 02/21/2020 0649   BUN 82 (H) 02/21/2020 0649   CREATININE 11.03 (H) 02/21/2020 0649   CALCIUM 8.7 (L) 02/21/2020 0649   GFRNONAA 5 (L) 02/21/2020 0649   CBC    Component Value Date/Time   WBC 11.4 (H) 02/21/2020 0649   RBC 4.80 02/21/2020 0649   HGB 13.9 02/21/2020 0649   HCT 41.4 02/21/2020 0649   PLT 96 (L) 02/21/2020 0649   MCV 86.3 02/21/2020 0649   MCH 29.0 02/21/2020 0649   MCHC 33.6 02/21/2020 0649   RDW 14.7 02/21/2020 0649   LYMPHSABS 2.2 03/03/2020 1303   MONOABS 0.9 03/13/2020 1303   EOSABS 0.0 03/14/2020 1303   BASOSABS 0.0 03/18/2020 1303   Assessment/Recommendations: [name] is a/an 44 y.o. male with a past medical history SLE, DVT, substance use who present  w/ severe stroke and AKI   Anuric Renal Failure: Likely secondary to ATN w/ watershed infarcts.  CK not severely elevated.  Possible CKD but no previous labs for review. He has a devastating brain injury without much chance for meaningful recovery. Have not been able to get in touch with family ( and those contacted do not wish to be involved in medical decisions). Supportive care for now and re-eval tomorrow. No obvious signs of neurological recovery.  He is not a candidate for dialysis and recommend transition to comfort care. -Hyperkalemia treat conservatively with lokelma. -F/u Neuro recs -Continue GOC convos with family if able to reach -No need for fluids at this time, can bolus as needed -Continue to monitor daily Cr, Dose meds for GFR -Monitor Daily  I/Os, Daily weight  -Maintain MAP>65 for optimal renal perfusion.  -Avoid nephrotoxic medications including NSAIDs and Vanc/Zosyn combo  Encephalopathy: Watershed stroke.  Awaiting neurology recommendations but prognosis appears poor.  Hyperkalemia:  Status post Lokelma with slight improvement to 6.1.  Continue with lokelma.  Metabolic acidosis: Minimal decrease with bicarb of 20.  Mild anion gap at 16.  Likely cause of AKI.  Continue to monitor  Shock liver: AST and ALT elevated likely related to shock state.  Improving.  Donetta Potts, MD [employer] [phone]

## 2020-02-22 DIAGNOSIS — Z9911 Dependence on respirator [ventilator] status: Secondary | ICD-10-CM

## 2020-02-22 DIAGNOSIS — I6340 Cerebral infarction due to embolism of unspecified cerebral artery: Secondary | ICD-10-CM

## 2020-02-22 DIAGNOSIS — J9601 Acute respiratory failure with hypoxia: Secondary | ICD-10-CM

## 2020-02-22 LAB — CBC
HCT: 38.7 % — ABNORMAL LOW (ref 39.0–52.0)
Hemoglobin: 13.8 g/dL (ref 13.0–17.0)
MCH: 30 pg (ref 26.0–34.0)
MCHC: 35.7 g/dL (ref 30.0–36.0)
MCV: 84.1 fL (ref 80.0–100.0)
Platelets: 102 10*3/uL — ABNORMAL LOW (ref 150–400)
RBC: 4.6 MIL/uL (ref 4.22–5.81)
RDW: 14.6 % (ref 11.5–15.5)
WBC: 12.6 10*3/uL — ABNORMAL HIGH (ref 4.0–10.5)
nRBC: 0 % (ref 0.0–0.2)

## 2020-02-22 LAB — PHOSPHORUS: Phosphorus: 8.9 mg/dL — ABNORMAL HIGH (ref 2.5–4.6)

## 2020-02-22 LAB — COMPREHENSIVE METABOLIC PANEL
ALT: 1664 U/L — ABNORMAL HIGH (ref 0–44)
AST: 226 U/L — ABNORMAL HIGH (ref 15–41)
Albumin: 2.4 g/dL — ABNORMAL LOW (ref 3.5–5.0)
Alkaline Phosphatase: 65 U/L (ref 38–126)
Anion gap: 19 — ABNORMAL HIGH (ref 5–15)
BUN: 105 mg/dL — ABNORMAL HIGH (ref 6–20)
CO2: 20 mmol/L — ABNORMAL LOW (ref 22–32)
Calcium: 8.7 mg/dL — ABNORMAL LOW (ref 8.9–10.3)
Chloride: 106 mmol/L (ref 98–111)
Creatinine, Ser: 11.54 mg/dL — ABNORMAL HIGH (ref 0.61–1.24)
GFR, Estimated: 5 mL/min — ABNORMAL LOW (ref >60)
Glucose, Bld: 129 mg/dL — ABNORMAL HIGH (ref 70–99)
Potassium: 5.2 mmol/L — ABNORMAL HIGH (ref 3.5–5.1)
Sodium: 145 mmol/L (ref 135–145)
Total Bilirubin: 0.8 mg/dL (ref 0.3–1.2)
Total Protein: 5.1 g/dL — ABNORMAL LOW (ref 6.5–8.1)

## 2020-02-22 LAB — GLUCOSE, CAPILLARY
Glucose-Capillary: 151 mg/dL — ABNORMAL HIGH (ref 70–99)
Glucose-Capillary: 141 mg/dL — ABNORMAL HIGH (ref 70–99)
Glucose-Capillary: 121 mg/dL — ABNORMAL HIGH (ref 70–99)
Glucose-Capillary: 130 mg/dL — ABNORMAL HIGH (ref 70–99)
Glucose-Capillary: 121 mg/dL — ABNORMAL HIGH (ref 70–99)

## 2020-02-22 LAB — VANCOMYCIN, RANDOM: Vancomycin Rm: 22

## 2020-02-22 LAB — MAGNESIUM: Magnesium: 2.8 mg/dL — ABNORMAL HIGH (ref 1.7–2.4)

## 2020-02-22 MED ORDER — GLYCOPYRROLATE 0.2 MG/ML IJ SOLN: 0.2000 mg | INTRAMUSCULAR | Status: DC | PRN

## 2020-02-22 MED ORDER — ACETAMINOPHEN 650 MG RE SUPP: 650.0000 mg | Freq: Four times a day (QID) | RECTAL | Status: DC | PRN

## 2020-02-22 MED ORDER — ONDANSETRON HCL 4 MG/2ML IJ SOLN: 4.0000 mg | Freq: Four times a day (QID) | INTRAMUSCULAR | Status: DC | PRN

## 2020-02-22 MED ORDER — POLYVINYL ALCOHOL 1.4 % OP SOLN
1.0000 [drp] | Freq: Four times a day (QID) | OPHTHALMIC | Status: DC | PRN
  Filled 2020-02-22: qty 15

## 2020-02-22 MED ORDER — ONDANSETRON 4 MG PO TBDP: 4.0000 mg | ORAL_TABLET | Freq: Four times a day (QID) | ORAL | Status: DC | PRN

## 2020-02-22 MED ORDER — HALOPERIDOL LACTATE 5 MG/ML IJ SOLN: 0.5000 mg | INTRAMUSCULAR | Status: DC | PRN

## 2020-02-22 MED ORDER — HALOPERIDOL LACTATE 2 MG/ML PO CONC
0.5000 mg | ORAL | Status: DC | PRN
  Filled 2020-02-22: qty 0.3

## 2020-02-22 MED ORDER — MORPHINE 100MG IN NS 100ML (1MG/ML) PREMIX INFUSION
5.0000 mg/h | INTRAVENOUS | Status: DC
  Administered 2020-02-23 – 2020-02-25 (×4): 5 mg/h via INTRAVENOUS
  Filled 2020-02-22 (×4): qty 100

## 2020-02-22 MED ORDER — ACETAMINOPHEN 325 MG PO TABS: 650.0000 mg | ORAL_TABLET | Freq: Four times a day (QID) | ORAL | Status: DC | PRN

## 2020-02-22 MED ORDER — GLYCOPYRROLATE 0.2 MG/ML IJ SOLN
0.2000 mg | INTRAMUSCULAR | Status: DC | PRN
  Administered 2020-02-24 – 2020-02-25 (×4): 0.2 mg via INTRAVENOUS
  Filled 2020-02-22 (×4): qty 1

## 2020-02-22 MED ORDER — HYDRALAZINE HCL 50 MG PO TABS: 50.0000 mg | ORAL_TABLET | Freq: Three times a day (TID) | ORAL | Status: DC | PRN

## 2020-02-22 MED ORDER — POLYVINYL ALCOHOL 1.4 % OP SOLN: 1.0000 [drp] | Freq: Four times a day (QID) | OPHTHALMIC | Status: DC | PRN

## 2020-02-22 MED ORDER — GLYCOPYRROLATE 1 MG PO TABS
1.0000 mg | ORAL_TABLET | ORAL | Status: DC | PRN
  Filled 2020-02-22: qty 1

## 2020-02-22 MED ORDER — HALOPERIDOL 0.5 MG PO TABS
0.5000 mg | ORAL_TABLET | ORAL | Status: DC | PRN
  Filled 2020-02-22: qty 1

## 2020-02-22 MED ORDER — MORPHINE BOLUS VIA INFUSION
2.0000 mg | INTRAVENOUS | Status: DC | PRN
  Administered 2020-02-23 – 2020-02-24 (×3): 2 mg via INTRAVENOUS
  Filled 2020-02-22: qty 2

## 2020-02-22 MED ORDER — BIOTENE DRY MOUTH MT LIQD: 15.0000 mL | OROMUCOSAL | Status: DC | PRN

## 2020-02-22 NOTE — Progress Notes (Signed)
eLink Physician-Brief Progress Note Patient Name: [name] DOB: [birth year] MRN: [id number]   Date of Service  02/22/2020  HPI/Events of Note  BP 146/100, patient is awaiting transition to comfort measures.  eICU Interventions  No intervention.        Thomasene Lot Sesilia Poucher 02/22/2020, 2:23 AM

## 2020-02-22 NOTE — Progress Notes (Signed)
Chart reviewed and after talking with a family member, the decision was made to transition to comfort care due to irreversible neurological damage.  Nothing to add and will sign off.

## 2020-02-22 NOTE — Progress Notes (Addendum)
PCCM:  I met with patients family at bedside. They have decided to pursue comfort care measures which I agree with.   Post extubation we will observe in the ICU.   He can be transferred out of the ICU later this evening if ICU bed is needed and his death does not appear eminent after transfer to comfort care measures.   Garner Nash, DO [facility] 02/22/2020 5:16 PM     PCCM Goals of Care Discussion and Advanced Care Planning:   Date: 02/22/2020   Present Parties: multiple family and friends, patients brother   What was discussed: current prognosis and next best steps. They family agreed on comfort care transition once other had an opportunity to visit.   Outcome: DNR, plans for comfort care transition   18 mins of time was spent discussing the goals of care, advanced care planning options such as code status as well as do not resuscitate forms. [phone])  Garner Nash, DO [facility] 02/22/2020 6:20 PM

## 2020-02-22 NOTE — Progress Notes (Signed)
Spoke with Mr.[name]'s brother, his wife and girlfriend at bedside regarding clinical course. Discussed lack of neurological recovery since admission and re-confirm decision to transition to comfort care. Patient's family expressed understanding and agrees with the plan. Mentions that the family's church pastor is on the way.

## 2020-02-22 NOTE — Progress Notes (Addendum)
NAME:  [name], MRN:  [id number], DOB:  1975-11-21, LOS: 3 ADMISSION DATE:  03/05/2020, CONSULTATION DATE:  02/29/2020 REFERRING MD:  [name], CHIEF COMPLAINT:  AMS   Brief History   Mr.Varnadore is a 44 yo M w/ PMH of polysubstance use, lupus w/ RLE DVT/PE (2011,2013) presenting to [facility] with unresponsiveness and encephalopathy due acute cerebral infarct and renal failure  Past Medical History  Cocaine Use Lupus Major depression  Significant Hospital Events   03/20/2020 Admission  Consults:  Neurology, Nephrology  Procedures:  03/10/2020 Intubated  Significant Diagnostic Tests:  02/25/2020: CT head - No acute hemorrahge 02/20/20: MRA head/neck - bilateral acute infarcts in watershed pattern, petechial hemorrhage in right parietal lobe, occipital lobe and cerebellar hemispheres 02/20/20: EEG - non-specific encephalopathy 02/20/20: Echo - LVEF 55-60, Grade 1 d/d, valves intact, dilated IVC  Micro Data:  03/08/2020: COVID/Flu neg 03/04/2020: Blood culture NGTD  Antimicrobials:  02/24/2020: Unasyn ->Stop 02/20/20 02/20/20: Vanc, Cefepime  Interim history/subjective:  Overnight noted to have productive light brown sputum with high PIP. Treated with albuterol and suctioning. Started on precedex due to frequent coughs and dyssynchronous breathing  Mr.Sisk was examined and evaluated at bedside this am. He was noted to much more responsive to painful and tactile stimuli with improved pupillary response. Still unable to follow directions.  Objective   Blood pressure (!) 143/98, pulse (!) 55, temperature (!) 95.2 F (35.1 C), resp. rate 18, height 6\' 4"  (1.93 m), weight 110.6 kg, SpO2 98 %.    Vent Mode: PRVC FiO2 (%):  [40 %] 40 % Set Rate:  [18 bmp] 18 bmp Vt Set:  [630 mL] 630 mL PEEP:  [5 cmH20] 5 cmH20 Pressure Support:  [5 cmH20] 5 cmH20 Plateau Pressure:  [15 cmH20-23 cmH20] 16 cmH20   Intake/Output Summary (Last 24 hours) at 02/22/2020 0744 Last data filed at 02/22/2020 0700 Gross per 24 hour   Intake 1489.41 ml  Output 1390 ml  Net 99.41 ml   Filed Weights   02/23/2020 2220 02/21/20 0443 02/22/20 0500  Weight: 108.7 kg 110.7 kg 110.6 kg   Examination:  Gen: ill-appearing, sedated HEENT: NCAT head, PERRL, intubated, bloody secretions around oral cavity Pulm: CTAB, no rales, no wheezes Extm: ROM intact, No peripheral edema Skin: Dry, Warm, normal turgor, no rashes, lesions, wounds.  Neuro: Flexion to pain, spontaneous eye movement  Resolved Hospital Problem list   N/A  Assessment & Plan:  #Acute metabolic encephalopathy #Watershed Infarcts & petechial hemorrhage MR overnight with new findings of watershed infarct and petechial hemorrhage. Likely due to arrest event. Etiology possibly due to cocaine induced vasospasm vs known hypercoagulable state from lupus. Unclear if have meaningful neurological recovery. Was able to speak with brother, 14/04/21, who understands low chance of recovery and agree to transition to comfort care. However this morning appear to have improving mentation, although full neurological recovery likely not possible. Will have further goals of care discussion at bedside when family arrives - Further goals of care discussion at bedside - Hold anti-coagulation in setting of intracranial bleed  #Acute respiratory failure 2/2 RML pneumonia, encephalopathy Intubated on arrival. Not improved with narcan. Wbc 10.5. Temp peak of 99.63F. Overnight wheezing improved with albuterol.  - Extubate to comfort if family is ready - PRVC 8cc/kg - VAp prevention  #Acute renal failure #Hyperkalemia Admit BUN 53, Creatinine 7.35, K 6.4. No history of renal disease. Made 400cc urine since admission. Nephro recommend goals of care discussion due to poor dialysis candidate. K improving  with Lokelma down to 5.2. Creatinine continues to be elevated at 11.54. Did make 1L urine output - Can stop lab checks for electrolytes/renal fx once transitioned to comfort care - Avoid  nephrotoxic meds when able  #NSTEMI Admit troponin with elevation from 11k->17k. Likely due to vasospasm in setting of cocaine use. UDS + for cocaine. EKG without ST changes. Echocardiogram performed yesterday w/o wall motion abnormalities. Would not be candidate for cath due to renal failure. Current bp stable at 145/100 - Avoid beta-blockade  #Elevated transaminsases AST, ALT >2000. AST down trend to 225, ALT downtrend to 1664. Likely due to shock liver.  - Avoid hepatotoxic meds when able - Trend LFTs  #DVT/PE w/ lupus Not on anticoagulation - Hold anticoagulation in setting of intracranial hemorrhage  Best practice (evaluated daily)   Diet: tube feeds Pain/Anxiety/Delirium protocol (if indicated): precedex VAP protocol (if indicated): vanc/cefepime DVT prophylaxis: SCDs GI prophylaxis: PPI Glucose control: ssi Mobility: N/A last date of multidisciplinary goals of care discussion: 02/21/20 Family and staff present: Brother plan for arrival later today Summary of discussion: Transition to comfort if low survivability Follow up goals of care discussion due: 02/22/20 Code Status: full code  Labs   CBC: Recent Labs  Lab 2019/04/06 1303 2019/04/06 1303 2019/04/06 1635 2019/04/06 1635 2019/04/06 1652 02/20/20 0422 02/20/20 0631 02/21/20 0649 02/22/20 0133  WBC 14.7*  --  10.5  --   --   --  11.1* 11.4* 12.6*  NEUTROABS 11.6*  --   --   --   --   --   --   --   --   HGB 17.2*   < > 15.6   < > 16.3 15.0 14.8 13.9 13.8  HCT 52.7*   < > 49.0   < > 48.0 44.0 43.3 41.4 38.7*  MCV 90.9  --  91.8  --   --   --  86.8 86.3 84.1  PLT 153  --  120*  --   --   --  99* 96* 102*   < > = values in this interval not displayed.    Basic Metabolic Panel: Recent Labs  Lab 2019/04/06 1303 2019/04/06 1303 2019/04/06 1624 2019/04/06 1635 02/20/20 0422 02/20/20 0631 02/20/20 0631 02/20/20 1337 02/20/20 1758 02/21/20 0649 02/21/20 1540 02/22/20 0133  NA 147*  --   --    < > 139 142  --  140  --   140  --  145  K 5.7*  --   --    < > 6.4* 6.4*  --  6.5*  --  6.1*  --  5.2*  CL 110  --   --   --   --  106  --  106  --  102  --  106  CO2 19*  --   --   --   --  20*  --  20*  --  19*  --  20*  GLUCOSE 109*  --   --   --   --  104*  --  111*  --  129*  --  129*  BUN 32*  --   --   --   --  53*  --  60*  --  82*  --  105*  CREATININE 5.08*   < > 5.58*  --   --  7.35*  --  8.50*  --  11.03*  --  11.54*  CALCIUM 8.0*  --   --   --   --  8.1*  --  8.3*  --  8.7*  --  8.7*  MG  --   --   --    < >  --  1.9  --   --  2.0 2.3 2.5* 2.8*  PHOS  --   --   --   --   --  4.7*   < > 4.8* 5.4* 7.8* 8.0* 8.9*   < > = values in this interval not displayed.   GFR: Estimated Creatinine Clearance: 11.4 mL/min (A) (by C-G formula based on SCr of 11.54 mg/dL (H)). Recent Labs  Lab 02-29-2020 1303 2020/02/29 1303 02/29/20 1503 02-29-2020 1635 02/29/2020 2035 02/20/20 0631 02/20/20 0854 02/21/20 0649 02/22/20 0133  WBC 14.7*   < >  --  10.5  --  11.1*  --  11.4* 12.6*  LATICACIDVEN 7.2*  --  4.7*  --  4.1*  --  2.1*  --   --    < > = values in this interval not displayed.    Liver Function Tests: Recent Labs  Lab Feb 29, 2020 1303 02/20/20 0854 02/20/20 1337 02/21/20 0649 02/22/20 0133  AST 2,100* 1,447*  --  681* 226*  ALT 2,636* 2,253*  --  2,184* 1,664*  ALKPHOS 70 47  --  52 65  BILITOT 0.8 0.8  --  0.8 0.8  PROT 5.3* 4.6*  --  5.0* 5.1*  ALBUMIN 3.1* 2.5* 2.6* 2.4* 2.4*   Recent Labs  Lab February 29, 2020 1303  LIPASE 26   No results for input(s): AMMONIA in the last 168 hours.  ABG    Component Value Date/Time   PHART 7.313 (L) 02/20/2020 0422   PCO2ART 41.6 02/20/2020 0422   PO2ART 100 02/20/2020 0422   HCO3 21.1 02/20/2020 0422   TCO2 22 02/20/2020 0422   ACIDBASEDEF 5.0 (H) 02/20/2020 0422   O2SAT 97.0 02/20/2020 0422     Coagulation Profile: Recent Labs  Lab February 29, 2020 1624 02/20/20 0631  INR 1.3* 1.5*    Cardiac Enzymes: Recent Labs  Lab 2020-02-29 1624 02/20/20 0631  02/21/20 0649  CKTOTAL 2,152* 2,032* 812*    HbA1C: Hgb A1c MFr Bld  Date/Time Value Ref Range Status  2020-02-29 06:17 PM 5.6 4.8 - 5.6 % Final    Comment:    REPEATED TO VERIFY (NOTE) Pre diabetes:          5.7%-6.4%  Diabetes:              >6.4%  Glycemic control for   <7.0% adults with diabetes     CBG: Recent Labs  Lab 02/21/20 1112 02/21/20 1515 02/21/20 1945 02/21/20 2314 02/22/20 0330  GLUCAP 131* 112* 118* 121* 151*    Review of Systems:   Unable to obtain  Past Medical History  Cocaine use Major depressive Disorder Recurrent DVT/PE w/ + lupus coagulopathy  Surgical History   None  Social History   Prior hx of cocaine, THC use. Unstable housing (found in motel)  Family History   His family history is not on file.   Allergies Not on File   Home Medications  Prior to Admission medications   Not on File   Theotis Barrio, MD 02/22/2020, 7:44 AM PGY-3 [facility] Medicine Pager: [phone]   PCCM attending:  Patient seen in conjunction with medical resident Erich Montane, MD agree with documentation above.  This is a 44 year old gentleman history of polysubstance abuse, history of DVT in 2011 2013 not on anticoagulation at home.  Patient was found down  in hotel, unknown downtime, UDS positive for cocaine.  Patient CT of the head on admission was negative for stroke however MRA revealed significant bilateral subacute strokes in watershed distribution infarction.  Patient has been off of sedation for greater than 48 hours now with no significant neurologic recovery.  There has been significant efforts upon the medical team to reach out to family.  We initially contacted the patient's wife who stated that have been separated for greater than 4 years and currently undergoing a divorce and she does not want to have anything to do with his medical decisions.  She would like to defer this to his children.  We had to work with case management to  help track down patient's family.  We had a place of work that was identified and family has been contacted.  The patient's sister states that if she wants to have nothing to do with his medical decision making.  The patient's brother did agree to come to the hospital for further discussions and would like for him to at least be a DNR.  This discussion was documented yesterday per Dr. Nedra Hai.  Today we are waiting for family to come to the hospital to have further goals of care discussions.  Patient remains critically ill intubated mechanical life support intensive care unit.  Of note he did have good urine output last night.  BP (!) 145/97 (BP Location: Left Arm)   Pulse 61   Temp (!) 97.5 F (36.4 C) (Axillary)   Resp (!) 28   Ht 6\' 4"  (1.93 m)   Wt 110.6 kg   SpO2 95%   BMI 29.68 kg/m   General: Middle-aged male intubated mechanical life support HEENT: Endotracheal tube in place Neuro: Fixed gaze to the left.  Pupils were responsive to light. Heart: Regular rhythm, S1-S2 lungs: Bilateral mechanically ventilated breath sounds  Labs: Reviewed Serum creatinine of 11 Urine output greater than a liter yesterday.  Assessment: Acute metabolic toxic encephalopathy present on admission Polysubstance abuse, likely overdose Acute renal failure Bilateral watershed cerebral infarcts Acute hypoxemic respiratory failure secondary to inability to protect airway secondary to above requiring intubation mechanical ventilation. Shock liver secondary to above,  Plan: Unfortunately this is a young gentleman with a very poor prognosis secondary to the multiple medical problems described above. We will continue support at this time. Working towards better goals of care discussions with patient's family. Not requiring vasopressors at this time. Nephrology has signed off Neurology has signed off. We will work to formulate a plan with the patient's brother which is hopefully going to be here at 5:00  today.  At this time patient remains critically ill mechanical life support.  This patient is critically ill with multiple organ system failure; which, requires frequent high complexity decision making, assessment, support, evaluation, and titration of therapies. This was completed through the application of advanced monitoring technologies and extensive interpretation of multiple databases. During this encounter critical care time was devoted to patient care services described in this note for 32 minutes.  , DO [place] [facility] 02/22/2020 4:01 PM

## 2020-02-22 NOTE — Progress Notes (Signed)
Spoke with patients family as they were leaving.  Family said that they wanted to wait to extubate until the morning when they reevaluated. Stated that they had not finalized decisions and will come back in the morning. Family stated they wanted this level of care continued.

## 2020-02-23 MED ORDER — CHLORHEXIDINE GLUCONATE CLOTH 2 % EX PADS
6.0000 | MEDICATED_PAD | Freq: Every day | CUTANEOUS | Status: DC
  Administered 2020-02-23 – 2020-02-24 (×2): 6 via TOPICAL

## 2020-02-23 NOTE — Progress Notes (Addendum)
PCCM:  I called and spoke with the patients sister. She was thankful for the call.  Patient's brother is coming in this evening.  Josephine Igo, DO [facility] 02/23/2020 3:03 PM    Patients sister called again. They are having difficulty with this situation. They dont want him to suffer. She would like to remove him from the vent. But she wants her brother to understand and able to cope with the situation.   Josephine Igo, DO [facility] 02/23/2020 5:37 PM

## 2020-02-23 NOTE — Consult Note (Signed)
Responded to consult, pt unavailable, staff will call again if further chaplain services are needed when family arrives later.   Rev. Donnel Saxon Chaplain

## 2020-02-23 NOTE — Progress Notes (Signed)
NAME:  [name], MRN:  [id number], DOB:  October 13, 1975, LOS: 4 ADMISSION DATE:  03-10-2020, CONSULTATION DATE:  2020-03-10 REFERRING MD:  [name], CHIEF COMPLAINT:  AMS   Brief History   [name] is a 44 yo M w/ PMH of polysubstance use, lupus w/ RLE DVT/PE (2011,2013) presenting to [facility] with unresponsiveness and encephalopathy due acute cerebral infarct and renal failure  Past Medical History  Cocaine Use Lupus Major depression  Significant Hospital Events   03-10-2020 Admission  Consults:  Neurology, Nephrology  Procedures:  2020/03/10 Intubated  Significant Diagnostic Tests:  2020/03/10: CT head - No acute hemorrahge 02/20/20: MRA head/neck - bilateral acute infarcts in watershed pattern, petechial hemorrhage in right parietal lobe, occipital lobe and cerebellar hemispheres 02/20/20: EEG - non-specific encephalopathy 02/20/20: Echo - LVEF 55-60, Grade 1 d/d, valves intact, dilated IVC  Micro Data:  2020/03/10: COVID/Flu neg March 10, 2020: Blood culture NGTD  Antimicrobials:  03/10/2020: Unasyn ->Stop 02/20/20 02/20/20: Vanc, Cefepime  Interim history/subjective:   Patient remains intubated on life support awaiting family transition to comfort care.  Decisions were made for transition to comfort care and they recommended that they would like to come back this morning for liberation from the ventilator.  And maintain current level of care.  Objective   Blood pressure (!) 155/93, pulse 67, temperature 98.1 F (36.7 C), resp. rate 19, height 6\' 4"  (1.93 m), weight 110.6 kg, SpO2 100 %.    Vent Mode: PRVC FiO2 (%):  [40 %-50 %] 50 % Set Rate:  [18 bmp] 18 bmp Vt Set:  [630 mL] 630 mL PEEP:  [5 cmH20] 5 cmH20 Plateau Pressure:  [13 cmH20-33 cmH20] 17 cmH20   Intake/Output Summary (Last 24 hours) at 02/23/2020 0706 Last data filed at 02/23/2020 0700 Gross per 24 hour  Intake 732.08 ml  Output 1525 ml  Net -792.92 ml   Filed Weights   03/10/20 2220 02/21/20 0443 02/22/20 0500  Weight: 108.7 kg  110.7 kg 110.6 kg   Examination:  Gen: Middle-aged male intubated on life support HEENT: Pupils reactive, occasional disconjugate gaze no nystagmus Pulm: Bilateral ventilated breath sounds Extm: No significant edema Skin: No rash Neuro: Spontaneous eye movements, no withdrawal to pain this morning for me  Resolved Hospital Problem list   N/A  Assessment & Plan:   #Acute metabolic encephalopathy #Watershed Infarcts & petechial hemorrhage #Acute respiratory failure 2/2 RML pneumonia, encephalopathy #Acute renal failure #Hyperkalemia #NSTEMI Polysubstance abuse, cocaine positive #Elevated transaminsases, shock liver #DVT/PE w/ lupus DNR Comfort care measures Plan: Patient remains on vent support until family ready for withdrawal. Continue as needed medications for comfort. No checking of labs. Patient remains DNR. No escalation of care  Best practice (evaluated daily)   Diet: tube feeds Pain/Anxiety/Delirium protocol (if indicated): precedex VAP protocol (if indicated): vanc/cefepime DVT prophylaxis: SCDs GI prophylaxis: PPI Glucose control: ssi Mobility: N/A last date of multidisciplinary goals of care discussion: 02/22/2020 Family and staff present: Brother plan for arrival later today Summary of discussion: Transition to comfort if low survivability Follow up goals of care discussion due: 02/22/20 Code Status: full code  Labs   CBC: Recent Labs  Lab 03-10-20 1303 03-10-20 1303 03-10-20 1635 03/10/20 1635 2020-03-10 1652 02/20/20 0422 02/20/20 0631 02/21/20 0649 02/22/20 0133  WBC 14.7*  --  10.5  --   --   --  11.1* 11.4* 12.6*  NEUTROABS 11.6*  --   --   --   --   --   --   --   --  HGB 17.2*   < > 15.6   < > 16.3 15.0 14.8 13.9 13.8  HCT 52.7*   < > 49.0   < > 48.0 44.0 43.3 41.4 38.7*  MCV 90.9  --  91.8  --   --   --  86.8 86.3 84.1  PLT 153  --  120*  --   --   --  99* 96* 102*   < > = values in this interval not displayed.    Basic Metabolic  Panel: Recent Labs  Lab 03/20/2020 1303 03/19/2020 1303 02/20/2020 1624 02/20/2020 1635 02/20/20 0422 02/20/20 0631 02/20/20 0631 02/20/20 1337 02/20/20 1758 02/21/20 0649 02/21/20 1540 02/22/20 0133  NA 147*  --   --    < > 139 142  --  140  --  140  --  145  K 5.7*  --   --    < > 6.4* 6.4*  --  6.5*  --  6.1*  --  5.2*  CL 110  --   --   --   --  106  --  106  --  102  --  106  CO2 19*  --   --   --   --  20*  --  20*  --  19*  --  20*  GLUCOSE 109*  --   --   --   --  104*  --  111*  --  129*  --  129*  BUN 32*  --   --   --   --  53*  --  60*  --  82*  --  105*  CREATININE 5.08*   < > 5.58*  --   --  7.35*  --  8.50*  --  11.03*  --  11.54*  CALCIUM 8.0*  --   --   --   --  8.1*  --  8.3*  --  8.7*  --  8.7*  MG  --   --   --    < >  --  1.9  --   --  2.0 2.3 2.5* 2.8*  PHOS  --   --   --   --   --  4.7*   < > 4.8* 5.4* 7.8* 8.0* 8.9*   < > = values in this interval not displayed.   GFR: Estimated Creatinine Clearance: 11.4 mL/min (A) (by C-G formula based on SCr of 11.54 mg/dL (H)). Recent Labs  Lab 03/18/2020 1303 03/02/2020 1303 03/17/2020 1503 03/09/2020 1635 03/20/2020 2035 02/20/20 0631 02/20/20 0854 02/21/20 0649 02/22/20 0133  WBC 14.7*   < >  --  10.5  --  11.1*  --  11.4* 12.6*  LATICACIDVEN 7.2*  --  4.7*  --  4.1*  --  2.1*  --   --    < > = values in this interval not displayed.    Liver Function Tests: Recent Labs  Lab 02/29/2020 1303 02/20/20 0854 02/20/20 1337 02/21/20 0649 02/22/20 0133  AST 2,100* 1,447*  --  681* 226*  ALT 2,636* 2,253*  --  2,184* 1,664*  ALKPHOS 70 47  --  52 65  BILITOT 0.8 0.8  --  0.8 0.8  PROT 5.3* 4.6*  --  5.0* 5.1*  ALBUMIN 3.1* 2.5* 2.6* 2.4* 2.4*   Recent Labs  Lab 02/25/2020 1303  LIPASE 26   No results for input(s): AMMONIA in the last 168 hours.  ABG    Component Value  Date/Time   PHART 7.313 (L) 02/20/2020 0422   PCO2ART 41.6 02/20/2020 0422   PO2ART 100 02/20/2020 0422   HCO3 21.1 02/20/2020 0422   TCO2 22  02/20/2020 0422   ACIDBASEDEF 5.0 (H) 02/20/2020 0422   O2SAT 97.0 02/20/2020 0422     Coagulation Profile: Recent Labs  Lab 03/11/2020 1624 02/20/20 0631  INR 1.3* 1.5*    Cardiac Enzymes: Recent Labs  Lab 03/17/2020 1624 02/20/20 0631 02/21/20 0649  CKTOTAL 2,152* 2,032* 812*    HbA1C: Hgb A1c MFr Bld  Date/Time Value Ref Range Status  03/01/2020 06:17 PM 5.6 4.8 - 5.6 % Final    Comment:    REPEATED TO VERIFY (NOTE) Pre diabetes:          5.7%-6.4%  Diabetes:              >6.4%  Glycemic control for   <7.0% adults with diabetes     CBG: Recent Labs  Lab 02/22/20 0330 02/22/20 0746 02/22/20 1132 02/22/20 1550 02/22/20 2020  GLUCAP 151* 141* 121* 130* 121*     Josephine Igo, DO [facility] 02/23/2020 7:06 AM

## 2020-02-23 NOTE — Plan of Care (Signed)
Plan to transition to comfort care

## 2020-02-24 LAB — CULTURE, BLOOD (ROUTINE X 2)
Culture: NO GROWTH
Culture: NO GROWTH

## 2020-02-24 MED ORDER — MIDAZOLAM 50MG/50ML (1MG/ML) PREMIX INFUSION
0.5000 mg/h | INTRAVENOUS | Status: DC
  Administered 2020-02-24: 10 mg/h via INTRAVENOUS
  Administered 2020-02-24: 2 mg/h via INTRAVENOUS
  Administered 2020-02-25: 10 mg/h via INTRAVENOUS
  Administered 2020-02-25: 5 mg/h via INTRAVENOUS
  Filled 2020-02-24 (×6): qty 50

## 2020-02-24 NOTE — Progress Notes (Signed)
Nutrition Brief Note  Chart reviewed. Pt now transitioning to comfort care.  No further nutrition interventions warranted at this time.  Please re-consult as needed.   Kate Annarose Ouellet, MS, RD, LDN Inpatient Clinical Dietitian Please see AM[facility] for contact information.  

## 2020-02-24 NOTE — Progress Notes (Signed)
Palliative Care Progress Note  Patient continues to be neurologically devastated without any chance for making a meaningful recovery. Family has made a decision to provide comfort care but did not return to visit following that determination. Today RN was able to get in touch with the patients brother who plans to come to hospital around 530 at which time we will remove him from the ventilator.   I have placed additional orders for the comfort care transition as well as an extubate order. Morphine infusion has been initiate. Will discontinue precedex and start a versed infusion for agitation, seizure prophylaxis and comfort. EOL orders in place.  Will follow patient. Anticipate hospital death.  Anderson Malta, DO Palliative Medicine [phone]  Time: 15 min Greater than 50%  of this time was spent counseling and coordinating care related to the above assessment and plan.

## 2020-02-24 NOTE — Progress Notes (Signed)
Extubated with family at bedside will continue to  Monitor. Tamsen Meek, RN 02/24/2020 7:17 PM

## 2020-02-24 NOTE — Progress Notes (Signed)
NAME:  [name], MRN:  [id number], DOB:  [birth year], LOS: 5 ADMISSION DATE:  2020/03/05, CONSULTATION DATE:  03-05-20 REFERRING MD:  [name], CHIEF COMPLAINT:  AMS   Brief History   Mr.Hammad is a 44 yo M w/ PMH of polysubstance use, lupus w/ RLE DVT/PE (2011,2013) presenting to [facility] with unresponsiveness and encephalopathy due acute cerebral infarct and renal failure  Past Medical History  Cocaine Use Lupus Major depression  Significant Hospital Events   03-05-20 Admission  Consults:  Neurology, Nephrology  Procedures:  2020-03-05 Intubated  Significant Diagnostic Tests:  03/05/2020: CT head - No acute hemorrahge 02/20/20: MRA head/neck - bilateral acute infarcts in watershed pattern, petechial hemorrhage in right parietal lobe, occipital lobe and cerebellar hemispheres 02/20/20: EEG - non-specific encephalopathy 02/20/20: Echo - LVEF 55-60, Grade 1 d/d, valves intact, dilated IVC  Micro Data:  Mar 05, 2020: COVID/Flu neg 03-05-2020: Blood culture NGTD  Antimicrobials:  Mar 05, 2020: Unasyn ->Stop 02/20/20 02/20/20: Vanc, Cefepime  Interim history/subjective:   On comfort care measures. Awaiting family for removal of vent support   Objective   Blood pressure 136/75, pulse 66, temperature 99.9 F (37.7 C), resp. rate 18, height 6\' 4"  (1.93 m), weight 110.6 kg, SpO2 100 %.    Vent Mode: PRVC FiO2 (%):  [30 %-40 %] 30 % Set Rate:  [18 bmp] 18 bmp Vt Set:  [630 mL] 630 mL PEEP:  [5 cmH20] 5 cmH20 Plateau Pressure:  [16 cmH20-46 cmH20] 18 cmH20   Intake/Output Summary (Last 24 hours) at 02/24/2020 0716 Last data filed at 02/24/2020 0550 Gross per 24 hour  Intake 539.59 ml  Output 700 ml  Net -160.41 ml   Filed Weights   Mar 05, 2020 2220 02/21/20 0443 02/22/20 0500  Weight: 108.7 kg 110.7 kg 110.6 kg   Examination:  Gen: middle aged male, intubated on life support  Pulm: BL vented breaths  He appears comfortable   Resolved Hospital Problem list   N/A  Assessment & Plan:   #Acute  metabolic encephalopathy #Watershed Infarcts & petechial hemorrhage #Acute respiratory failure 2/2 RML pneumonia, encephalopathy #Acute renal failure #Hyperkalemia #NSTEMI Polysubstance abuse, cocaine positive #Elevated transaminsases, shock liver #DVT/PE w/ lupus DNR Comfort care measures Plan: Remains of vent support  Awaiting family for removal from vent  On comfort care measures Discussed with palliative care team  I appreciate their input and help We will need to make plans for removal from vent as he has been on comfort care for two days now and we have given the family adequate time.   Best practice (evaluated daily)   Diet: tube feeds stopped  Pain/Anxiety/Delirium protocol (if indicated): morphjne for comfort VAP protocol (if indicated): na  DVT prophylaxis: SCDs GI prophylaxis: PPI Glucose control: ssi Mobility: N/A last date of multidisciplinary goals of care discussion: x Code Status: full code  Labs   CBC: Recent Labs  Lab 03-05-2020 1303 2020/03/05 1303 03/05/2020 1635 2020/03/05 1635 03-05-20 1652 02/20/20 0422 02/20/20 0631 02/21/20 0649 02/22/20 0133  WBC 14.7*  --  10.5  --   --   --  11.1* 11.4* 12.6*  NEUTROABS 11.6*  --   --   --   --   --   --   --   --   HGB 17.2*   < > 15.6   < > 16.3 15.0 14.8 13.9 13.8  HCT 52.7*   < > 49.0   < > 48.0 44.0 43.3 41.4 38.7*  MCV 90.9  --  91.8  --   --   --  86.8 86.3 84.1  PLT 153  --  120*  --   --   --  99* 96* 102*   < > = values in this interval not displayed.    Basic Metabolic Panel: Recent Labs  Lab 03-16-2020 1303 03-16-2020 1303 2020/03/16 1624 03/16/2020 1635 02/20/20 0422 02/20/20 0631 02/20/20 0631 02/20/20 1337 02/20/20 1758 02/21/20 0649 02/21/20 1540 02/22/20 0133  NA 147*  --   --    < > 139 142  --  140  --  140  --  145  K 5.7*  --   --    < > 6.4* 6.4*  --  6.5*  --  6.1*  --  5.2*  CL 110  --   --   --   --  106  --  106  --  102  --  106  CO2 19*  --   --   --   --  20*  --  20*  --   19*  --  20*  GLUCOSE 109*  --   --   --   --  104*  --  111*  --  129*  --  129*  BUN 32*  --   --   --   --  53*  --  60*  --  82*  --  105*  CREATININE 5.08*   < > 5.58*  --   --  7.35*  --  8.50*  --  11.03*  --  11.54*  CALCIUM 8.0*  --   --   --   --  8.1*  --  8.3*  --  8.7*  --  8.7*  MG  --   --   --    < >  --  1.9  --   --  2.0 2.3 2.5* 2.8*  PHOS  --   --   --   --   --  4.7*   < > 4.8* 5.4* 7.8* 8.0* 8.9*   < > = values in this interval not displayed.   GFR: Estimated Creatinine Clearance: 11.4 mL/min (A) (by C-G formula based on SCr of 11.54 mg/dL (H)). Recent Labs  Lab 2020/03/16 1303 2020-03-16 1303 2020-03-16 1503 16-Mar-2020 1635 Mar 16, 2020 2035 02/20/20 0631 02/20/20 0854 02/21/20 0649 02/22/20 0133  WBC 14.7*   < >  --  10.5  --  11.1*  --  11.4* 12.6*  LATICACIDVEN 7.2*  --  4.7*  --  4.1*  --  2.1*  --   --    < > = values in this interval not displayed.    Liver Function Tests: Recent Labs  Lab 2020-03-16 1303 02/20/20 0854 02/20/20 1337 02/21/20 0649 02/22/20 0133  AST 2,100* 1,447*  --  681* 226*  ALT 2,636* 2,253*  --  2,184* 1,664*  ALKPHOS 70 47  --  52 65  BILITOT 0.8 0.8  --  0.8 0.8  PROT 5.3* 4.6*  --  5.0* 5.1*  ALBUMIN 3.1* 2.5* 2.6* 2.4* 2.4*   Recent Labs  Lab 2020-03-16 1303  LIPASE 26   No results for input(s): AMMONIA in the last 168 hours.  ABG    Component Value Date/Time   PHART 7.313 (L) 02/20/2020 0422   PCO2ART 41.6 02/20/2020 0422   PO2ART 100 02/20/2020 0422   HCO3 21.1 02/20/2020 0422   TCO2 22 02/20/2020 0422   ACIDBASEDEF 5.0 (H) 02/20/2020 0422   O2SAT 97.0 02/20/2020 0422  Coagulation Profile: Recent Labs  Lab 03/17/2020 1624 02/20/20 0631  INR 1.3* 1.5*    Cardiac Enzymes: Recent Labs  Lab 02/25/2020 1624 02/20/20 0631 02/21/20 0649  CKTOTAL 2,152* 2,032* 812*    HbA1C: Hgb A1c MFr Bld  Date/Time Value Ref Range Status  03/04/2020 06:17 PM 5.6 4.8 - 5.6 % Final    Comment:    REPEATED TO  VERIFY (NOTE) Pre diabetes:          5.7%-6.4%  Diabetes:              >6.4%  Glycemic control for   <7.0% adults with diabetes     CBG: Recent Labs  Lab 02/22/20 0330 02/22/20 0746 02/22/20 1132 02/22/20 1550 02/22/20 2020  GLUCAP 151* 141* 121* 130* 121*     Josephine Igo, DO [facility] 02/24/2020 7:16 AM

## 2020-02-24 NOTE — Progress Notes (Signed)
Spoke with [name] regarding plan for one-way extubation. He mentions that he would like to be present for the extubation procedure. Mentions he will be off work around [employer] and will visit the hospital then.

## 2020-02-25 NOTE — Progress Notes (Signed)
Patient transferred to [facility] room 12.

## 2020-02-25 NOTE — Progress Notes (Signed)
NAME:  [name], MRN:  [id number], DOB:  01/18/1976, LOS: 6 ADMISSION DATE:  02/28/2020, CONSULTATION DATE:  02/24/2020 REFERRING MD:  [name], CHIEF COMPLAINT:  AMS   Brief History   [name] is a 44 yo M w/ PMH of polysubstance use, lupus w/ RLE DVT/PE (2011,2013) presenting to [facility] with unresponsiveness and encephalopathy due acute cerebral infarct and renal failure  Past Medical History  Cocaine Use Lupus Major depression  Significant Hospital Events   03/08/2020 Admission  Consults:  Neurology, Nephrology  Procedures:  03/03/2020 Intubated  Significant Diagnostic Tests:  02/22/2020: CT head - No acute hemorrahge 02/20/20: MRA head/neck - bilateral acute infarcts in watershed pattern, petechial hemorrhage in right parietal lobe, occipital lobe and cerebellar hemispheres 02/20/20: EEG - non-specific encephalopathy 02/20/20: Echo - LVEF 55-60, Grade 1 d/d, valves intact, dilated IVC 02/24/2020: extubated to comfort measures.   Micro Data:  03/05/2020: COVID/Flu neg 02/25/2020: Blood culture NGTD  Antimicrobials:  03/14/2020: Unasyn ->Stop 02/20/20 02/20/20: Vanc, Cefepime  Interim history/subjective:   Comfort measures.   Objective   Blood pressure (!) 153/92, pulse 64, temperature 99.1 F (37.3 C), resp. rate (!) 25, height 6\' 4"  (1.93 m), weight 110.6 kg, SpO2 100 %.    Vent Mode: PRVC FiO2 (%):  [30 %] 30 % Set Rate:  [18 bmp] 18 bmp Vt Set:  [630 mL] 630 mL PEEP:  [5 cmH20] 5 cmH20 Plateau Pressure:  [17 cmH20] 17 cmH20   Intake/Output Summary (Last 24 hours) at 02/25/2020 1154 Last data filed at 02/25/2020 0600 Gross per 24 hour  Intake 289.36 ml  Output 850 ml  Net -560.64 ml   Filed Weights   02/20/2020 2220 02/21/20 0443 02/22/20 0500  Weight: 108.7 kg 110.7 kg 110.6 kg   Examination:  Gen: extubated. Normal RR, appears comfortable   Resolved Hospital Problem list   N/A  Assessment & Plan:   #Acute metabolic encephalopathy #Watershed Infarcts & petechial  hemorrhage #Acute respiratory failure 2/2 RML pneumonia, encephalopathy #Acute renal failure #Hyperkalemia #NSTEMI Polysubstance abuse, cocaine positive #Elevated transaminsases, shock liver #DVT/PE w/ lupus DNR Comfort care measures Plan: Extubated to comfort care Morphine continuous for comfort  Prn versed for comfort    Labs   CBC: Recent Labs  Lab 03/14/2020 1303 02/25/2020 1303 03/20/2020 1635 03/05/2020 1635 02/23/2020 1652 02/20/20 0422 02/20/20 0631 02/21/20 0649 02/22/20 0133  WBC 14.7*  --  10.5  --   --   --  11.1* 11.4* 12.6*  NEUTROABS 11.6*  --   --   --   --   --   --   --   --   HGB 17.2*   < > 15.6   < > 16.3 15.0 14.8 13.9 13.8  HCT 52.7*   < > 49.0   < > 48.0 44.0 43.3 41.4 38.7*  MCV 90.9  --  91.8  --   --   --  86.8 86.3 84.1  PLT 153  --  120*  --   --   --  99* 96* 102*   < > = values in this interval not displayed.    Basic Metabolic Panel: Recent Labs  Lab 02/21/2020 1303 03/03/2020 1303 03/13/2020 1624 03/02/2020 1635 02/20/20 0422 02/20/20 0631 02/20/20 0631 02/20/20 1337 02/20/20 1758 02/21/20 0649 02/21/20 1540 02/22/20 0133  NA 147*  --   --    < > 139 142  --  140  --  140  --  145  K 5.7*  --   --    < >  6.4* 6.4*  --  6.5*  --  6.1*  --  5.2*  CL 110  --   --   --   --  106  --  106  --  102  --  106  CO2 19*  --   --   --   --  20*  --  20*  --  19*  --  20*  GLUCOSE 109*  --   --   --   --  104*  --  111*  --  129*  --  129*  BUN 32*  --   --   --   --  53*  --  60*  --  82*  --  105*  CREATININE 5.08*   < > 5.58*  --   --  7.35*  --  8.50*  --  11.03*  --  11.54*  CALCIUM 8.0*  --   --   --   --  8.1*  --  8.3*  --  8.7*  --  8.7*  MG  --   --   --    < >  --  1.9  --   --  2.0 2.3 2.5* 2.8*  PHOS  --   --   --   --   --  4.7*   < > 4.8* 5.4* 7.8* 8.0* 8.9*   < > = values in this interval not displayed.   GFR: Estimated Creatinine Clearance: 11.4 mL/min (A) (by C-G formula based on SCr of 11.54 mg/dL (H)). Recent Labs  Lab  03/07/2020 1303 03/17/2020 1303 03/19/2020 1503 02/29/2020 1635 03/20/2020 2035 02/20/20 0631 02/20/20 0854 02/21/20 0649 02/22/20 0133  WBC 14.7*   < >  --  10.5  --  11.1*  --  11.4* 12.6*  LATICACIDVEN 7.2*  --  4.7*  --  4.1*  --  2.1*  --   --    < > = values in this interval not displayed.    Liver Function Tests: Recent Labs  Lab 03/14/2020 1303 02/20/20 0854 02/20/20 1337 02/21/20 0649 02/22/20 0133  AST 2,100* 1,447*  --  681* 226*  ALT 2,636* 2,253*  --  2,184* 1,664*  ALKPHOS 70 47  --  52 65  BILITOT 0.8 0.8  --  0.8 0.8  PROT 5.3* 4.6*  --  5.0* 5.1*  ALBUMIN 3.1* 2.5* 2.6* 2.4* 2.4*   Recent Labs  Lab 02/24/2020 1303  LIPASE 26   No results for input(s): AMMONIA in the last 168 hours.  ABG    Component Value Date/Time   PHART 7.313 (L) 02/20/2020 0422   PCO2ART 41.6 02/20/2020 0422   PO2ART 100 02/20/2020 0422   HCO3 21.1 02/20/2020 0422   TCO2 22 02/20/2020 0422   ACIDBASEDEF 5.0 (H) 02/20/2020 0422   O2SAT 97.0 02/20/2020 0422     Coagulation Profile: Recent Labs  Lab 03/02/2020 1624 02/20/20 0631  INR 1.3* 1.5*    Cardiac Enzymes: Recent Labs  Lab 03/08/2020 1624 02/20/20 0631 02/21/20 0649  CKTOTAL 2,152* 2,032* 812*    HbA1C: Hgb A1c MFr Bld  Date/Time Value Ref Range Status  02/25/2020 06:17 PM 5.6 4.8 - 5.6 % Final    Comment:    REPEATED TO VERIFY (NOTE) Pre diabetes:          5.7%-6.4%  Diabetes:              >6.4%  Glycemic control for   <7.0% adults with diabetes  CBG: Recent Labs  Lab 02/22/20 0330 02/22/20 0746 02/22/20 1132 02/22/20 1550 02/22/20 2020  GLUCAP 151* 141* 121* 130* 121*     Josephine Igo, DO  Pulmonary Critical Care 02/25/2020 11:54 AM

## 2020-02-27 ENCOUNTER — Encounter (HOSPITAL_COMMUNITY): Payer: Self-pay | Admitting: Emergency Medicine

## 2020-03-21 NOTE — Progress Notes (Signed)
Brother Wallace Cullens [phone]) called at [phone]. This RN updated him of patients expiration at 0045 on February 27, 2020. He is not sure what funeral home he will go to and will ask his pastor and call patient placement.

## 2020-03-21 NOTE — Death Summary Note (Signed)
DEATH SUMMARY   Patient Details  Name: [name] MRN: [id number] DOB: 08-23-1975  Admission/Discharge Information   Admit Date:  2020/02/27  Date of Death: Date of Death: 03/05/2020  Time of Death: Time of Death: 15-Jul-2043  Length of Stay: 7  Referring Physician: Pcp, No   Reason(s) for Hospitalization  [name] is a 45 yo M w/ PMH of polysubstance use, lupus w/ RLE DVT/PE 14-Jul-2009) presenting to [facility] with unresponsiveness and encephalopathy due acute cerebral infarct and renal failure  Diagnoses  Preliminary cause of death: CVA (cerebral vascular accident) (HCC) Secondary Diagnoses (including complications and co-morbidities):  Active Problems:   TOBACCO USER   Cocaine use disorder, severe, dependence (HCC)   Respiratory failure (HCC)   Cerebral embolism with cerebral infarction  Brief Hospital Course (including significant findings, care, treatment, and services provided and events leading to death)  [name] is a 45 y.o. year old male who w/ PMH of polysubstance use, lupus w/ RLE DVT/PE Jul 14, 2009) presenting to [facility] with unresponsiveness and encephalopathy due acute cerebral infarct and renal failure.  Patient was found down in a hotel room unresponsive concern for drug overdose.  In addition patient was found to have acute renal failure and hyperkalemia which was treated medically.  27-Feb-2020: CT head - No acute hemorrahge 02/20/20: MRA head/neck - bilateral acute infarcts in watershed pattern, petechial hemorrhage in right parietal lobe, occipital lobe and cerebellar hemispheres 02/20/20: EEG - non-specific encephalopathy 02/20/20: Echo - LVEF 55-60, Grade 1 d/d, valves intact, dilated IVC 02/24/2020: extubated to comfort measures.   Ultimately patient had no significant neurologic recovery after having severe acute bilateral watershed infarcts.  Likely related to hypotension and drug overdose.  Patient's family was eventually contacted after some difficulty.  Decision was made for  comfort care withdrawal.  Patient passed peacefully on comfort care measures.  Pertinent Labs and Studies  Significant Diagnostic Studies DG Cervical Spine 1 View  Result Date: 02-27-20 CLINICAL DATA:  Intubated, preprocedure evaluation for MRI EXAM: DG CERVICAL SPINE - 1 VIEW COMPARISON:  None. FINDINGS: Two lateral views of the cervical spine are obtained. The cervical spine is in anatomic alignment to the C6/C7 disc space. Endotracheal tube and enteric catheter are identified. Diffuse dental amalgam. No unexpected radiopaque foreign bodies. IMPRESSION: 1. Support devices and dental amalgam as above. No unexpected radiopaque foreign bodies. Electronically Signed   By: Sharlet Salina M.D.   On: February 27, 2020 20:10   DG Pelvis 1-2 Views  Result Date: 02-27-2020 CLINICAL DATA:  Abnormal head CT, preprocedure evaluation for MRI EXAM: PELVIS - 1-2 VIEW COMPARISON:  None. FINDINGS: Two frontal views of the pelvis demonstrate Foley catheter. Rounded calcification left gluteal region may reflect injection granulomata. No unexpected radiopaque foreign bodies. No acute bony abnormalities. Soft tissues are unremarkable. IMPRESSION: 1. Foley catheter as above. No unexpected radiopaque foreign bodies. Electronically Signed   By: Sharlet Salina M.D.   On: 27-Feb-2020 20:11   DG Abd 1 View  Result Date: 2020-02-27 CLINICAL DATA:  Abnormal head CT, preprocedure evaluation for MRI EXAM: ABDOMEN - 1 VIEW COMPARISON:  None. FINDINGS: Frontal view of the lower chest and upper abdomen excludes the right lateral chest wall by collimation. Enteric catheter tip and side port project over the gastric fundus. Stable right perihilar airspace disease. Bowel gas pattern is unremarkable. No unexpected radiopaque foreign bodies. IMPRESSION: 1. Enteric catheter as above. Otherwise no unexpected radiopaque foreign bodies. 2. Stable asymmetric right-sided airspace disease which may reflect aspiration or pneumonia. Electronically Signed  By: Sharlet SalinaMichael  Brown M.D.   On: 13-Dec-2019 20:09   CT Head Wo Contrast  Result Date: 03/10/2020 CLINICAL DATA:  Found unresponsive EXAM: CT HEAD WITHOUT CONTRAST TECHNIQUE: Contiguous axial images were obtained from the base of the skull through the vertex without intravenous contrast. COMPARISON:  2011 FINDINGS: Brain: There is no acute intracranial hemorrhage. There is hypoattenuation in the peripheral left greater than right cerebellum. There is likely small area of right occipital involvement with loss of gray-white differentiation. Additional area of loss of gray-white differentiation is suspected right parietal lobe. No hydrocephalus or herniation.  There is no extra-axial collection. Vascular: No hyperdense vessel or unexpected calcification. Skull: Calvarium is unremarkable. Sinuses/Orbits: Patchy mucosal thickening.  Orbits are unremarkable. Other: None. IMPRESSION: No acute intracranial hemorrhage. Areas of acute/subacute infarction in the left greater than right cerebellum, right occipital lobe, and right parietal lobe. These results were called by telephone at the time of interpretation on 03/17/2020 at 3:20 pm to provider Geoffery LyonsUGLAS DELO , who verbally acknowledged these results. Electronically Signed   By: Guadlupe SpanishPraneil  Patel M.D.   On: 13-Dec-2019 15:22   MR ANGIO HEAD WO CONTRAST  Result Date: 02/20/2020 CLINICAL DATA:  Encephalopathy EXAM: MRI HEAD WITHOUT CONTRAST MRA HEAD WITHOUT CONTRAST MRA NECK WITHOUT CONTRAST TECHNIQUE: Multiplanar, multiecho pulse sequences of the brain and surrounding structures were obtained without intravenous contrast. Angiographic images of the Circle of Willis were obtained using MRA technique without intravenous contrast. Angiographic images of the neck were obtained using MRA technique without intravenous contrast. Carotid stenosis measurements (when applicable) are obtained utilizing NASCET criteria, using the distal internal carotid diameter as the denominator.  COMPARISON:  None. FINDINGS: MRI HEAD FINDINGS Brain: Numerous bilateral acute infarcts that are in a predominantly deep watershed pattern. Additionally, there are large bilateral cerebellar infarcts. Multifocal cytotoxic edema. No midline shift or other mass effect. There is petechial hemorrhage in the right parietal lobe, right occipital lobe and both cerebellar hemispheres. Normal volume of CSF spaces. Vascular: Major flow voids are preserved. Skull and upper cervical spine: Small subgaleal hematoma at the scalp vertex. Sinuses/Orbits:No paranasal sinus fluid levels or advanced mucosal thickening. No mastoid or middle ear effusion. Normal orbits. MRA HEAD FINDINGS POSTERIOR CIRCULATION: --Vertebral arteries: Normal --Inferior cerebellar arteries: Normal. --Basilar artery: Normal. --Superior cerebellar arteries: Normal. --Posterior cerebral arteries: Normal. ANTERIOR CIRCULATION: --Intracranial internal carotid arteries: Normal. --Anterior cerebral arteries (ACA): Normal. --Middle cerebral arteries (MCA): Normal. ANATOMIC VARIANTS: Fetal origin of the left PCA. MRA NECK FINDINGS Normal carotid and vertebral arteries. IMPRESSION: 1. Numerous bilateral acute infarcts that are in a predominantly deep watershed pattern. 2. Petechial hemorrhage in the right parietal lobe, right occipital lobe and both cerebellar hemispheres. 3. No emergent large vessel occlusion or high-grade stenosis. Electronically Signed   By: Deatra RobinsonKevin  Herman M.D.   On: 02/20/2020 03:04   MR ANGIO NECK WO CONTRAST  Result Date: 02/20/2020 CLINICAL DATA:  Encephalopathy EXAM: MRI HEAD WITHOUT CONTRAST MRA HEAD WITHOUT CONTRAST MRA NECK WITHOUT CONTRAST TECHNIQUE: Multiplanar, multiecho pulse sequences of the brain and surrounding structures were obtained without intravenous contrast. Angiographic images of the Circle of Willis were obtained using MRA technique without intravenous contrast. Angiographic images of the neck were obtained using MRA  technique without intravenous contrast. Carotid stenosis measurements (when applicable) are obtained utilizing NASCET criteria, using the distal internal carotid diameter as the denominator. COMPARISON:  None. FINDINGS: MRI HEAD FINDINGS Brain: Numerous bilateral acute infarcts that are in a predominantly deep watershed pattern. Additionally, there are large bilateral  cerebellar infarcts. Multifocal cytotoxic edema. No midline shift or other mass effect. There is petechial hemorrhage in the right parietal lobe, right occipital lobe and both cerebellar hemispheres. Normal volume of CSF spaces. Vascular: Major flow voids are preserved. Skull and upper cervical spine: Small subgaleal hematoma at the scalp vertex. Sinuses/Orbits:No paranasal sinus fluid levels or advanced mucosal thickening. No mastoid or middle ear effusion. Normal orbits. MRA HEAD FINDINGS POSTERIOR CIRCULATION: --Vertebral arteries: Normal --Inferior cerebellar arteries: Normal. --Basilar artery: Normal. --Superior cerebellar arteries: Normal. --Posterior cerebral arteries: Normal. ANTERIOR CIRCULATION: --Intracranial internal carotid arteries: Normal. --Anterior cerebral arteries (ACA): Normal. --Middle cerebral arteries (MCA): Normal. ANATOMIC VARIANTS: Fetal origin of the left PCA. MRA NECK FINDINGS Normal carotid and vertebral arteries. IMPRESSION: 1. Numerous bilateral acute infarcts that are in a predominantly deep watershed pattern. 2. Petechial hemorrhage in the right parietal lobe, right occipital lobe and both cerebellar hemispheres. 3. No emergent large vessel occlusion or high-grade stenosis. Electronically Signed   By: Deatra Robinson M.D.   On: 02/20/2020 03:04   MR BRAIN WO CONTRAST  Result Date: 02/20/2020 CLINICAL DATA:  Encephalopathy EXAM: MRI HEAD WITHOUT CONTRAST MRA HEAD WITHOUT CONTRAST MRA NECK WITHOUT CONTRAST TECHNIQUE: Multiplanar, multiecho pulse sequences of the brain and surrounding structures were obtained without  intravenous contrast. Angiographic images of the Circle of Willis were obtained using MRA technique without intravenous contrast. Angiographic images of the neck were obtained using MRA technique without intravenous contrast. Carotid stenosis measurements (when applicable) are obtained utilizing NASCET criteria, using the distal internal carotid diameter as the denominator. COMPARISON:  None. FINDINGS: MRI HEAD FINDINGS Brain: Numerous bilateral acute infarcts that are in a predominantly deep watershed pattern. Additionally, there are large bilateral cerebellar infarcts. Multifocal cytotoxic edema. No midline shift or other mass effect. There is petechial hemorrhage in the right parietal lobe, right occipital lobe and both cerebellar hemispheres. Normal volume of CSF spaces. Vascular: Major flow voids are preserved. Skull and upper cervical spine: Small subgaleal hematoma at the scalp vertex. Sinuses/Orbits:No paranasal sinus fluid levels or advanced mucosal thickening. No mastoid or middle ear effusion. Normal orbits. MRA HEAD FINDINGS POSTERIOR CIRCULATION: --Vertebral arteries: Normal --Inferior cerebellar arteries: Normal. --Basilar artery: Normal. --Superior cerebellar arteries: Normal. --Posterior cerebral arteries: Normal. ANTERIOR CIRCULATION: --Intracranial internal carotid arteries: Normal. --Anterior cerebral arteries (ACA): Normal. --Middle cerebral arteries (MCA): Normal. ANATOMIC VARIANTS: Fetal origin of the left PCA. MRA NECK FINDINGS Normal carotid and vertebral arteries. IMPRESSION: 1. Numerous bilateral acute infarcts that are in a predominantly deep watershed pattern. 2. Petechial hemorrhage in the right parietal lobe, right occipital lobe and both cerebellar hemispheres. 3. No emergent large vessel occlusion or high-grade stenosis. Electronically Signed   By: Deatra Robinson M.D.   On: 02/20/2020 03:04   DG Chest Portable 1 View  Result Date: 03-04-2020 CLINICAL DATA:  Hypoxia EXAM: PORTABLE  CHEST 1 VIEW COMPARISON:  December 11, 2014 FINDINGS: Endotracheal tube tip is 5.3 cm above the carina. Nasogastric tube tip and side port are below the diaphragm. No pneumothorax. There is airspace opacity in the right mid lung. Left lung clear. Heart size and pulmonary vascularity are normal. No adenopathy. No bone lesions. IMPRESSION: Tube positions as described without pneumothorax. Airspace opacity in the right mid lung is consistent with pneumonia or potential aspiration. Lungs elsewhere clear. Heart size normal. Electronically Signed   By: Bretta Bang III M.D.   On: 03-04-2020 13:11   EEG adult  Result Date: 02/20/2020 Charlsie Quest, MD     02/20/2020 12:19  PM Patient Name: RAEQUAN VANSCHAICK MRN: [id number] Epilepsy Attending: Charlsie Quest Referring Physician/Provider: Emilie Rutter, PA Date: 02/20/2020 Duration: 22.42 minutes Patient history: 45 year old male with history of polysubstance abuse with altered mental status.  MRI showed watershed infarct and petechial hemorrhage.  EEG to evaluate for seizures. Level of alertness: Comatose AEDs during EEG study: None Technical aspects: This EEG study was done with scalp electrodes positioned according to the 10-20 International system of electrode placement. Electrical activity was acquired at a sampling rate of 500Hz  and reviewed with a high frequency filter of 70Hz  and a low frequency filter of 1Hz . EEG data were recorded continuously and digitally stored. Description: No clear posterior dominant rhythm was seen.  EEG showed continued generalized maximal bifrontal 5-7 Hz theta slowing as well as intermittent generalized 2 to 3 Hz delta slowing.  Brief 2 to 3-second periods of generalized EEG attenuation also noted at times.  Hyperventilation and photic stimulation were not performed.   ABNORMALITY -Continuous slow, generalized and maximal bifrontal region IMPRESSION: This study is suggestive of severe diffuse encephalopathy, nonspecific etiology but  could be secondary to sedation.  No seizures or epileptiform discharges were seen throughout the recording.   ECHOCARDIOGRAM COMPLETE  Result Date: 02/20/2020    ECHOCARDIOGRAM REPORT   Patient Name:   JESSEN SIEGMAN Date of Exam: 02/20/2020 Medical Rec #:  14/04/2019    Height:       73.0 in Accession #:    [name]   Weight:       239.6 lb Date of Birth:  May 17, 1975    BSA:          2.324 m Patient Age:    44 years     BP:           104/72 mmHg Patient Gender: M            HR:           96 bpm. Exam Location:  Inpatient Procedure: 2D Echo, Cardiac Doppler and Color Doppler Indications:    Cardiac arrest  History:        Patient has no prior history of Echocardiogram examinations.                 Arrythmias:Cardiac Arrest, Signs/Symptoms:Syncope; Risk                 Factors:Current Smoker. Polysubstance abuse, resp. failure.  Sonographer:    [id number] Referring Phys: [id number] 05/29/1975  Sonographer Comments: Echo performed with patient supine and on artificial respirator. IMPRESSIONS  1. Left ventricular ejection fraction, by estimation, is 55 to 60%. The left ventricle has normal function. The left ventricle has no regional wall motion abnormalities. There is mild left ventricular hypertrophy. Left ventricular diastolic parameters are consistent with Grade I diastolic dysfunction (impaired relaxation).  2. Right ventricular systolic function is normal. The right ventricular size is normal.  3. The mitral valve is normal in structure. No evidence of mitral valve regurgitation. No evidence of mitral stenosis.  4. The aortic valve is tricuspid. Aortic valve regurgitation is not visualized. No aortic stenosis is present.  5. The inferior vena cava is dilated in size with <50% respiratory variability, suggesting right atrial pressure of 15 mmHg. FINDINGS  Left Ventricle: Left ventricular ejection fraction, by estimation, is 55 to 60%. The left ventricle has normal function. The left  ventricle has no regional wall motion abnormalities. The left ventricular internal cavity size was normal in size.  There is  mild left ventricular hypertrophy. Left ventricular diastolic parameters are consistent with Grade I diastolic dysfunction (impaired relaxation). Right Ventricle: The right ventricular size is normal.Right ventricular systolic function is normal. Left Atrium: Left atrial size was normal in size. Right Atrium: Right atrial size was normal in size. Pericardium: Trivial pericardial effusion is present. Mitral Valve: The mitral valve is normal in structure. No evidence of mitral valve regurgitation. No evidence of mitral valve stenosis. Tricuspid Valve: The tricuspid valve is normal in structure. Tricuspid valve regurgitation is trivial. No evidence of tricuspid stenosis. Aortic Valve: The aortic valve is tricuspid. Aortic valve regurgitation is not visualized. No aortic stenosis is present. Pulmonic Valve: The pulmonic valve was not well visualized. Pulmonic valve regurgitation is not visualized. No evidence of pulmonic stenosis. Aorta: The aortic root is normal in size and structure. Venous: The inferior vena cava is dilated in size with less than 50% respiratory variability, suggesting right atrial pressure of 15 mmHg. IAS/Shunts: No atrial level shunt detected by color flow Doppler.  LEFT VENTRICLE PLAX 2D LVIDd:         4.30 cm  Diastology LVIDs:         3.10 cm  LV e' medial:    5.66 cm/s LV PW:         1.30 cm  LV E/e' medial:  10.9 LV IVS:        1.20 cm  LV e' lateral:   8.92 cm/s LVOT diam:     2.30 cm  LV E/e' lateral: 6.9 LV SV:         64 LV SV Index:   28 LVOT Area:     4.15 cm  RIGHT VENTRICLE RV Basal diam:  3.50 cm RV S prime:     5.00 cm/s TAPSE (M-mode): 1.5 cm LEFT ATRIUM             Index       RIGHT ATRIUM           Index LA diam:        3.10 cm 1.33 cm/m  RA Area:     12.20 cm LA Vol (A2C):   44.5 ml 19.15 ml/m RA Volume:   29.90 ml  12.87 ml/m LA Vol (A4C):   25.7 ml  11.06 ml/m LA Biplane Vol: 35.4 ml 15.24 ml/m  AORTIC VALVE LVOT Vmax:   105.00 cm/s LVOT Vmean:  64.600 cm/s LVOT VTI:    0.154 m  AORTA Ao Root diam: 3.40 cm MITRAL VALVE MV Area (PHT): 5.27 cm    SHUNTS MV Decel Time: 144 msec    Systemic VTI:  0.15 m MV E velocity: 61.70 cm/s  Systemic Diam: 2.30 cm MV A velocity: 57.80 cm/s MV E/A ratio:  1.07 Olga Millers MD Electronically signed by Olga Millers MD Signature Date/Time: 02/20/2020/11:34:46 AM    Final     Microbiology No results found for this or any previous visit (from the past 240 hour(s)).  Lab Basic Metabolic Panel: No results for input(s): NA, K, CL, CO2, GLUCOSE, BUN, CREATININE, CALCIUM, MG, PHOS in the last 168 hours. Liver Function Tests: No results for input(s): AST, ALT, ALKPHOS, BILITOT, PROT, ALBUMIN in the last 168 hours. No results for input(s): LIPASE, AMYLASE in the last 168 hours. No results for input(s): AMMONIA in the last 168 hours. CBC: No results for input(s): WBC, NEUTROABS, HGB, HCT, MCV, PLT in the last 168 hours. Cardiac Enzymes: No results for input(s): CKTOTAL, CKMB, CKMBINDEX, TROPONINI  in the last 168 hours. Sepsis Labs: No results for input(s): PROCALCITON, WBC, LATICACIDVEN in the last 168 hours.  Procedures/Operations  ETT   [employer] Tashala Cumbo 03/02/2020, 6:17 PM

## 2020-03-21 NOTE — Progress Notes (Signed)
Wasted approx 60 ml of morphine & 10 ml of versed in stericycle.  Charge RN [place] witnessed [employer]

## 2020-03-21 NOTE — Progress Notes (Signed)
RN has attempted to notify family of patient passing multiple times.  Left several voicemail messages for brother to return call.  At current time patient's brother has not returned call.

## 2020-03-21 DEATH — deceased
